# Patient Record
Sex: Female | Born: 1935 | Race: White | Hispanic: No | Smoking: Former smoker
Health system: Southern US, Community
[De-identification: ages and names within clinical notes are randomized; demographics above are authoritative.]

## PROBLEM LIST (undated history)

## (undated) DIAGNOSIS — M1990 Unspecified osteoarthritis, unspecified site: Secondary | ICD-10-CM

## (undated) DIAGNOSIS — G4730 Sleep apnea, unspecified: Secondary | ICD-10-CM

## (undated) DIAGNOSIS — I10 Essential (primary) hypertension: Secondary | ICD-10-CM

## (undated) DIAGNOSIS — R0602 Shortness of breath: Secondary | ICD-10-CM

## (undated) DIAGNOSIS — I639 Cerebral infarction, unspecified: Secondary | ICD-10-CM

## (undated) HISTORY — PX: EYE SURGERY: SHX253

## (undated) HISTORY — PX: OTHER SURGICAL HISTORY: SHX169

---

## 1999-10-11 ENCOUNTER — Encounter: Payer: Self-pay | Admitting: Family Medicine

## 1999-10-11 ENCOUNTER — Ambulatory Visit (HOSPITAL_COMMUNITY): Admission: RE | Admit: 1999-10-11 | Discharge: 1999-10-11 | Payer: Self-pay | Admitting: *Deleted

## 2001-07-15 ENCOUNTER — Ambulatory Visit (HOSPITAL_COMMUNITY): Admission: RE | Admit: 2001-07-15 | Discharge: 2001-07-15 | Payer: Self-pay | Admitting: Family Medicine

## 2002-03-04 ENCOUNTER — Encounter: Payer: Self-pay | Admitting: Family Medicine

## 2002-03-04 ENCOUNTER — Ambulatory Visit (HOSPITAL_COMMUNITY): Admission: RE | Admit: 2002-03-04 | Discharge: 2002-03-04 | Payer: Self-pay | Admitting: Family Medicine

## 2002-12-24 ENCOUNTER — Ambulatory Visit (HOSPITAL_COMMUNITY): Admission: RE | Admit: 2002-12-24 | Discharge: 2002-12-24 | Payer: Self-pay | Admitting: Family Medicine

## 2003-09-21 ENCOUNTER — Ambulatory Visit: Payer: Self-pay | Admitting: Family Medicine

## 2003-09-22 ENCOUNTER — Ambulatory Visit: Payer: Self-pay | Admitting: *Deleted

## 2003-09-24 ENCOUNTER — Ambulatory Visit (HOSPITAL_COMMUNITY): Admission: RE | Admit: 2003-09-24 | Discharge: 2003-09-24 | Payer: Self-pay | Admitting: Family Medicine

## 2003-10-02 ENCOUNTER — Ambulatory Visit: Payer: Self-pay | Admitting: Family Medicine

## 2003-12-08 DIAGNOSIS — K5730 Diverticulosis of large intestine without perforation or abscess without bleeding: Secondary | ICD-10-CM | POA: Insufficient documentation

## 2004-09-13 ENCOUNTER — Ambulatory Visit (HOSPITAL_COMMUNITY): Admission: RE | Admit: 2004-09-13 | Discharge: 2004-09-13 | Payer: Self-pay | Admitting: Family Medicine

## 2004-09-19 ENCOUNTER — Other Ambulatory Visit: Admission: RE | Admit: 2004-09-19 | Discharge: 2004-09-19 | Payer: Self-pay | Admitting: Family Medicine

## 2004-09-19 ENCOUNTER — Ambulatory Visit: Payer: Self-pay | Admitting: Family Medicine

## 2004-09-20 ENCOUNTER — Ambulatory Visit: Payer: Self-pay | Admitting: Family Medicine

## 2005-09-15 ENCOUNTER — Ambulatory Visit (HOSPITAL_COMMUNITY): Admission: RE | Admit: 2005-09-15 | Discharge: 2005-09-15 | Payer: Self-pay | Admitting: Family Medicine

## 2005-09-28 ENCOUNTER — Ambulatory Visit: Payer: Self-pay | Admitting: Internal Medicine

## 2005-10-03 ENCOUNTER — Ambulatory Visit: Payer: Self-pay | Admitting: Family Medicine

## 2005-10-05 ENCOUNTER — Ambulatory Visit: Payer: Self-pay | Admitting: Family Medicine

## 2006-05-05 ENCOUNTER — Encounter (INDEPENDENT_AMBULATORY_CARE_PROVIDER_SITE_OTHER): Payer: Self-pay | Admitting: Family Medicine

## 2006-05-05 DIAGNOSIS — I872 Venous insufficiency (chronic) (peripheral): Secondary | ICD-10-CM | POA: Insufficient documentation

## 2006-05-05 DIAGNOSIS — M1990 Unspecified osteoarthritis, unspecified site: Secondary | ICD-10-CM | POA: Insufficient documentation

## 2006-07-26 ENCOUNTER — Ambulatory Visit: Payer: Self-pay | Admitting: Internal Medicine

## 2006-11-30 ENCOUNTER — Emergency Department (HOSPITAL_COMMUNITY): Admission: EM | Admit: 2006-11-30 | Discharge: 2006-11-30 | Payer: Self-pay | Admitting: Family Medicine

## 2007-01-18 ENCOUNTER — Telehealth (INDEPENDENT_AMBULATORY_CARE_PROVIDER_SITE_OTHER): Payer: Self-pay | Admitting: Family Medicine

## 2007-01-21 ENCOUNTER — Encounter (INDEPENDENT_AMBULATORY_CARE_PROVIDER_SITE_OTHER): Payer: Self-pay | Admitting: Family Medicine

## 2007-02-21 ENCOUNTER — Ambulatory Visit (HOSPITAL_COMMUNITY): Admission: RE | Admit: 2007-02-21 | Discharge: 2007-02-21 | Payer: Self-pay | Admitting: Family Medicine

## 2007-02-26 ENCOUNTER — Encounter (INDEPENDENT_AMBULATORY_CARE_PROVIDER_SITE_OTHER): Payer: Self-pay | Admitting: Family Medicine

## 2007-02-26 ENCOUNTER — Ambulatory Visit: Payer: Self-pay | Admitting: Family Medicine

## 2007-02-26 DIAGNOSIS — M25519 Pain in unspecified shoulder: Secondary | ICD-10-CM

## 2007-02-26 DIAGNOSIS — I10 Essential (primary) hypertension: Secondary | ICD-10-CM | POA: Insufficient documentation

## 2007-02-26 LAB — CONVERTED CEMR LAB
Nitrite: NEGATIVE
Pap Smear: NORMAL
Specific Gravity, Urine: 1.025
WBC Urine, dipstick: NEGATIVE

## 2007-03-01 ENCOUNTER — Ambulatory Visit: Payer: Self-pay | Admitting: Family Medicine

## 2007-03-01 LAB — CONVERTED CEMR LAB
ALT: 13 units/L (ref 0–35)
AST: 14 units/L (ref 0–37)
Albumin: 3.7 g/dL (ref 3.5–5.2)
Alkaline Phosphatase: 71 units/L (ref 39–117)
BUN: 13 mg/dL (ref 6–23)
Basophils Absolute: 0 10*3/uL (ref 0.0–0.1)
Basophils Relative: 1 % (ref 0–1)
Creatinine, Ser: 0.73 mg/dL (ref 0.40–1.20)
Eosinophils Relative: 5 % (ref 0–5)
Glucose, Bld: 98 mg/dL (ref 70–99)
HCT: 43.8 % (ref 36.0–46.0)
HDL: 44 mg/dL (ref >39)
Neutrophils Relative %: 44 % (ref 43–77)
Potassium: 3.9 meq/L (ref 3.5–5.3)
RBC: 4.54 M/uL (ref 3.87–5.11)
RDW: 12.7 % (ref 11.5–15.5)
Sodium: 141 meq/L (ref 135–145)
TSH: 2.061 microintl units/mL (ref 0.350–5.50)
Total Bilirubin: 0.5 mg/dL (ref 0.3–1.2)
Total Protein: 6.5 g/dL (ref 6.0–8.3)
WBC: 6.2 10*3/uL (ref 4.0–10.5)

## 2008-04-15 ENCOUNTER — Ambulatory Visit (HOSPITAL_COMMUNITY): Admission: RE | Admit: 2008-04-15 | Discharge: 2008-04-15 | Payer: Self-pay | Admitting: Internal Medicine

## 2008-04-21 ENCOUNTER — Encounter (INDEPENDENT_AMBULATORY_CARE_PROVIDER_SITE_OTHER): Payer: Self-pay | Admitting: Family Medicine

## 2008-06-25 ENCOUNTER — Ambulatory Visit: Payer: Self-pay | Admitting: Internal Medicine

## 2008-06-25 DIAGNOSIS — K648 Other hemorrhoids: Secondary | ICD-10-CM | POA: Insufficient documentation

## 2008-06-25 DIAGNOSIS — Q828 Other specified congenital malformations of skin: Secondary | ICD-10-CM | POA: Insufficient documentation

## 2008-06-25 DIAGNOSIS — K644 Residual hemorrhoidal skin tags: Secondary | ICD-10-CM | POA: Insufficient documentation

## 2008-06-25 LAB — CONVERTED CEMR LAB
ALT: 13 units/L (ref 0–35)
AST: 16 units/L (ref 0–37)
BUN: 13 mg/dL (ref 6–23)
Basophils Absolute: 0.1 10*3/uL (ref 0.0–0.1)
Basophils Relative: 1 % (ref 0–1)
Bilirubin Urine: NEGATIVE
CO2: 30 meq/L (ref 19–32)
Chloride: 108 meq/L (ref 96–112)
Glucose, Bld: 104 mg/dL — ABNORMAL HIGH (ref 70–99)
HDL: 45 mg/dL (ref >39)
Hemoglobin: 14.3 g/dL (ref 12.0–15.0)
Lymphs Abs: 2.4 10*3/uL (ref 0.7–4.0)
MCHC: 33.4 g/dL (ref 30.0–36.0)
Monocytes Relative: 9 % (ref 3–12)
Neutro Abs: 3 10*3/uL (ref 1.7–7.7)
Platelets: 192 10*3/uL (ref 150–400)
Potassium: 4.3 meq/L (ref 3.5–5.3)
Specific Gravity, Urine: 1.025
Total Bilirubin: 0.4 mg/dL (ref 0.3–1.2)
Total CHOL/HDL Ratio: 4.2
Triglycerides: 99 mg/dL (ref <150)
Urobilinogen, UA: 0.2
WBC: 6.3 10*3/uL (ref 4.0–10.5)
pH: 5

## 2008-07-02 ENCOUNTER — Encounter (INDEPENDENT_AMBULATORY_CARE_PROVIDER_SITE_OTHER): Payer: Self-pay | Admitting: Internal Medicine

## 2008-10-26 ENCOUNTER — Ambulatory Visit: Payer: Self-pay | Admitting: Internal Medicine

## 2008-10-26 LAB — CONVERTED CEMR LAB
Blood Glucose, AC Bkfst: 137 mg/dL
Hgb A1c MFr Bld: 5.6 %

## 2008-10-31 ENCOUNTER — Encounter (INDEPENDENT_AMBULATORY_CARE_PROVIDER_SITE_OTHER): Payer: Self-pay | Admitting: Internal Medicine

## 2008-10-31 ENCOUNTER — Encounter (INDEPENDENT_AMBULATORY_CARE_PROVIDER_SITE_OTHER): Payer: Self-pay | Admitting: Psychiatry

## 2008-10-31 ENCOUNTER — Inpatient Hospital Stay (HOSPITAL_COMMUNITY): Admission: EM | Admit: 2008-10-31 | Discharge: 2008-10-31 | Payer: Self-pay | Admitting: Emergency Medicine

## 2008-11-10 ENCOUNTER — Ambulatory Visit: Payer: Self-pay | Admitting: Internal Medicine

## 2008-11-20 ENCOUNTER — Ambulatory Visit: Payer: Self-pay | Admitting: Internal Medicine

## 2008-11-20 DIAGNOSIS — H43819 Vitreous degeneration, unspecified eye: Secondary | ICD-10-CM

## 2008-11-20 DIAGNOSIS — B079 Viral wart, unspecified: Secondary | ICD-10-CM | POA: Insufficient documentation

## 2009-02-09 ENCOUNTER — Encounter: Admission: RE | Admit: 2009-02-09 | Discharge: 2009-02-09 | Payer: Self-pay | Admitting: Internal Medicine

## 2009-06-16 ENCOUNTER — Ambulatory Visit (HOSPITAL_COMMUNITY): Admission: RE | Admit: 2009-06-16 | Discharge: 2009-06-16 | Payer: Self-pay | Admitting: Internal Medicine

## 2009-07-02 ENCOUNTER — Ambulatory Visit: Payer: Self-pay | Admitting: Internal Medicine

## 2009-07-02 DIAGNOSIS — R5383 Other fatigue: Secondary | ICD-10-CM | POA: Insufficient documentation

## 2009-07-02 DIAGNOSIS — H43399 Other vitreous opacities, unspecified eye: Secondary | ICD-10-CM | POA: Insufficient documentation

## 2009-07-02 DIAGNOSIS — R5381 Other malaise: Secondary | ICD-10-CM

## 2009-07-08 ENCOUNTER — Encounter (INDEPENDENT_AMBULATORY_CARE_PROVIDER_SITE_OTHER): Payer: Self-pay | Admitting: Internal Medicine

## 2009-07-08 ENCOUNTER — Ambulatory Visit (HOSPITAL_COMMUNITY): Admission: RE | Admit: 2009-07-08 | Discharge: 2009-07-08 | Payer: Self-pay | Admitting: Internal Medicine

## 2009-07-09 ENCOUNTER — Encounter (INDEPENDENT_AMBULATORY_CARE_PROVIDER_SITE_OTHER): Payer: Self-pay | Admitting: Internal Medicine

## 2009-07-12 ENCOUNTER — Ambulatory Visit: Payer: Self-pay | Admitting: Internal Medicine

## 2009-07-12 LAB — CONVERTED CEMR LAB
AST: 18 units/L (ref 0–37)
Albumin: 3.9 g/dL (ref 3.5–5.2)
BUN: 17 mg/dL (ref 6–23)
Basophils Relative: 1 % (ref 0–1)
CO2: 25 meq/L (ref 19–32)
Calcium: 8.7 mg/dL (ref 8.4–10.5)
Chloride: 105 meq/L (ref 96–112)
Creatinine, Ser: 1.02 mg/dL (ref 0.40–1.20)
Eosinophils Absolute: 0.2 10*3/uL (ref 0.0–0.7)
Glucose, Bld: 104 mg/dL — ABNORMAL HIGH (ref 70–99)
Glucose, Urine, Semiquant: NEGATIVE
HCT: 43 % (ref 36.0–46.0)
HDL: 48 mg/dL (ref >39)
Hemoglobin: 14.2 g/dL (ref 12.0–15.0)
Lymphs Abs: 2.1 10*3/uL (ref 0.7–4.0)
Neutro Abs: 4.4 10*3/uL (ref 1.7–7.7)
Neutrophils Relative %: 60 % (ref 43–77)
Nitrite: NEGATIVE
Platelets: 214 10*3/uL (ref 150–400)
Protein, U semiquant: NEGATIVE
RBC: 4.39 M/uL (ref 3.87–5.11)
RDW: 13 % (ref 11.5–15.5)
Total Bilirubin: 0.6 mg/dL (ref 0.3–1.2)
Total CHOL/HDL Ratio: 3.5
Total Protein: 6.9 g/dL (ref 6.0–8.3)
VLDL: 15 mg/dL (ref 0–40)

## 2009-07-20 ENCOUNTER — Ambulatory Visit: Payer: Self-pay | Admitting: Internal Medicine

## 2009-07-20 DIAGNOSIS — R7309 Other abnormal glucose: Secondary | ICD-10-CM | POA: Insufficient documentation

## 2009-09-21 ENCOUNTER — Telehealth (INDEPENDENT_AMBULATORY_CARE_PROVIDER_SITE_OTHER): Payer: Self-pay | Admitting: Internal Medicine

## 2009-09-24 ENCOUNTER — Observation Stay (HOSPITAL_COMMUNITY)
Admission: EM | Admit: 2009-09-24 | Discharge: 2009-09-25 | Payer: Self-pay | Source: Home / Self Care | Admitting: Emergency Medicine

## 2009-09-25 ENCOUNTER — Ambulatory Visit: Payer: Self-pay | Admitting: Cardiology

## 2009-09-25 ENCOUNTER — Encounter (INDEPENDENT_AMBULATORY_CARE_PROVIDER_SITE_OTHER): Payer: Self-pay | Admitting: Emergency Medicine

## 2009-09-25 ENCOUNTER — Ambulatory Visit: Payer: Self-pay | Admitting: Vascular Surgery

## 2009-09-30 ENCOUNTER — Telehealth (INDEPENDENT_AMBULATORY_CARE_PROVIDER_SITE_OTHER): Payer: Self-pay | Admitting: Internal Medicine

## 2009-11-02 ENCOUNTER — Ambulatory Visit: Payer: Self-pay | Admitting: Internal Medicine

## 2009-11-02 DIAGNOSIS — G459 Transient cerebral ischemic attack, unspecified: Secondary | ICD-10-CM | POA: Insufficient documentation

## 2009-11-05 ENCOUNTER — Telehealth (INDEPENDENT_AMBULATORY_CARE_PROVIDER_SITE_OTHER): Payer: Self-pay | Admitting: Internal Medicine

## 2010-02-01 NOTE — Progress Notes (Signed)
Summary: Office Visit//DEPRESSION SCREENING  Office Visit//DEPRESSION SCREENING   Imported By: Arta Bruce 07/08/2009 14:48:06  _____________________________________________________________________  External Attachment:    Type:   Image     Comment:   External Document

## 2010-02-01 NOTE — Assessment & Plan Note (Signed)
Summary: cpp/////kt   Vital Signs:  Patient profile:   75 year old female Menstrual status:  postmenopausal Height:      64.25 inches Weight:      292 pounds BMI:     49.91 Temp:     97.9 degrees F oral Pulse rate:   98 / minute Pulse rhythm:   regular Resp:     24 per minute BP sitting:   47 / 74  (left arm) Cuff size:   large  Vitals Entered By: Armenia Shannon (July 02, 2009 2:34 PM) CC: Shelby Wiley wants to know if she can have meds for a long period of time so she wont have to keep going to pharmacy every 30 days... Is Patient Diabetic? No Pain Assessment Patient in pain? no       Does patient need assistance? Functional Status Self care Ambulation Normal   CC:  Shelby Wiley wants to know if she can have meds for a long period of time so she wont have to keep going to pharmacy every 30 days....  History of Present Illness: 75 yo female here for CPP.  Concerns:  1.  Knees are very painful:  using a cane at home.  Does not go out secondary to the pain.  Feels like she needs to have something done so she can enjoy life more.  Has had arthroscopic surgery for her left knee with arthritis in the past.  Last Xrays done in early to mid 90s.    2.  Fatigue:  not sure if secondary to inability to be active with knees--feels this has been for the past 2 months.  Has really gone hand in hand with mobility.  NO melena or hematochezia.  No abdominal pain.    3.  Mom had history of afib and secondary stroke.  Wiley. not aware of anything predisposing her mother to that.  Wiley. wants to know if she is at risk.  Has been picking up irregular heartbeats on heart monitor.  4.  Phlegm:  occasional yellow mucous production--generally after have lay down and sat back home.   Allergies: 1)  ! Codeine Sulfate  Past History:  Past Medical History: Reviewed history from 06/25/2008 and no changes required. HEMORRHOIDS, INTERNAL (ICD-455.0) EXTERNAL HEMORRHOIDS (ICD-455.3) ROUTINE GYNECOLOGICAL  EXAMINATION (ICD-V72.31) SHOULDER PAIN, RIGHT (ICD-719.41) HYPERTENSION, BORDERLINE (ICD-401.9) EXAMINATION, ROUTINE MEDICAL (ICD-V70.0) DIVERTICULOSIS, COLON (ICD-562.10) OSTEOARTHRITIS (ICD-715.90) INSUFFICIENCY, VENOUS NOS (ICD-459.81) OBESITY, MORBID (ICD-278.01)  Past Surgical History: 1.  Bilateral Cataract Extractions 2.  Left Knee Arthroscopic Surgery--before 1995  Family History: Reviewed history from 06/25/2008 and no changes required. Mother, died 65:  Stroke.  Not clear what she died from--Wiley. cannot say why unclear. Father, died 10s:  Alzheimer's dementia,  Sister, died 57:  Breast cancer--diagnosed age 90 Brother, died 34:  MI Has 3 sisters living--6 other sisters have died at elderly ages, Wiley. not sure from what.  Ones living are relatively healthy Son, 53: DM, overweight  Daughter, 71:  healthy.  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  1 1/2 ppd.  Quit at 75 yo Alcohol:  rare--none now. Drug use:  No  Review of Systems Psych:  PHQ 9 scored 5--mainly secondary to fatigue.  Physical Exam  General:  morbidly obese Head:  Normocephalic and atraumatic without obvious abnormalities. No apparent alopecia or balding. Eyes:  No corneal or conjunctival inflammation noted. EOMI. Perrla. Funduscopic exam benign, without hemorrhages, exudates or  papilledema. Vision grossly normal. Ears:  External ear exam shows no significant lesions or deformities.  Otoscopic examination reveals clear canals, tympanic membranes are intact bilaterally without bulging, retraction, inflammation or discharge. Hearing is grossly normal bilaterally. Nose:  External nasal examination shows no deformity or inflammation. Nasal mucosa are pink and moist without lesions or exudates. Mouth:  Oral mucosa and oropharynx without lesions or exudates.  Teeth in good repair. Neck:  No deformities, masses, or tenderness  noted. Breasts:  No mass, nodules, thickening, tenderness, bulging, retraction, inflamation, nipple discharge or skin changes noted.   Lungs:  Normal respiratory effort, chest expands symmetrically. Lungs are clear to auscultation, no crackles or wheezes. Heart:  Normal rate and regular rhythm. S1 and S2 normal without gallop, murmur, click, rub or other extra sounds. Abdomen:  Bowel sounds positive,abdomen soft and non-tender without masses, organomegaly or hernias noted. Rectal:  No external abnormalities noted. Normal sphincter tone. No rectal masses or tenderness.  Heme negative light brown stool Genitalia:  Pelvic Exam:        External: normal female genitalia without lesions or masses                     Adnexa: normal bimanual exam without masses or fullness        Uterus: normal by palpation        Pap smear: not performed Exam limited by Wiley's size Msk:  No deformity or scoliosis noted of thoracic or lumbar spine.   Pulses:  R and L carotid,radial,femoral,dorsalis pedis and posterior tibial pulses are full and equal bilaterally Extremities:  hypertrophic changes to bilateral knees Neurologic:  No cranial nerve deficits noted. Station and gait are normal. Plantar reflexes are down-going bilaterally. DTRs are symmetrical throughout. Sensory, motor and coordinative functions appear intact. Skin:  Multiple pink flaky lesions scattered mainly on arms--Wiley. still not using sunscreen Cervical Nodes:  No lymphadenopathy noted Axillary Nodes:  No palpable lymphadenopathy Inguinal Nodes:  No significant adenopathy Psych:  Cognition and judgment appear intact. Alert and cooperative with normal attention span and concentration. No apparent delusions, illusions, hallucinations   Impression & Recommendations:  Problem # 1:  ROUTINE GYNECOLOGICAL EXAMINATION (ICD-V72.31) Mammogram done  Problem # 2:  HEALTH MAINTENANCE EXAM (ICD-V70.0) Guaiac cards x3 to return in 2 weeks. Immunizations up to  date--Wiley. refuses yearly flu vaccine  Problem # 3:  OSTEOARTHRITIS (ICD-715.90) Particularly of knees Water exercise Schedule for injection starting with right knee. Discussed complications/benefits Orders: Diagnostic X-Ray/Fluoroscopy (Diagnostic X-Ray/Flu)  Problem # 4:  ACTINIC KERATOSIS,ARMS AND LEGS (ICD-702.0) Wiley. to follow up in November to get started on 5 FU cream  Problem # 5:  Family Hx of FIBRILLATION, ATRIAL (ICD-427.31) Discussed with Wiley. that she has normal rhythm and what to look for with afib and pulse  Complete Medication List: 1)  Calcium-vitamin D 600-200 Mg-unit Tabs (Calcium-vitamin d) .Marland Kitchen.. 1 tab by mouth two times a day 2)  [facility] Use  .... Medically clear to use facilities.  would like her to use pool on daily basis if possible 3)  Hydrochlorothiazide 12.5 Mg Caps (Hydrochlorothiazide) .Marland Kitchen.. 1 tab by mouth daily  Patient Instructions: 1)  Schedule appt. with Dr. Delrae Alfred for knee injection--right knee first. 2)  OV to start 5 FU in November with Dr. Charmayne Sheer keratoses 3)  Fasting lab visit for FLP, UA--also TSH, CBC, CMET for dx of fatigue  Preventive Care Screening  Prior Values:    Pap Smear:  normal (02/26/2007)  Mammogram:  ASSESSMENT: Negative - BI-RADS 1^MM DIGITAL SCREENING (06/16/2009)    Colonoscopy:  Done (12/03/2003)    Bone Density:  normal (09/24/2003)    Last Tetanus Booster:  Historical (08/20/2002)    Last Flu Shot:  Historical (10/03/2005)    Last Pneumovax:  Historical (08/20/2002)    Dexa Interp:  normal (09/24/2003)     SBE:  Does not perform Guaiac Cards:  Has missed in recent years.  Prescriptions: [facility] USE Medically clear to use facilities.  Would like her to use pool on daily basis if possible  #1 x 0   Entered and Authorized by:   Julieanne Manson MD   Signed by:   Julieanne Manson MD on 07/02/2009   Method used:   Print then Give to Patient   RxID:   [id number] HYDROCHLOROTHIAZIDE 12.5  MG CAPS (HYDROCHLOROTHIAZIDE) 1 tab by mouth daily  #90 x 3   Entered and Authorized by:   Julieanne Manson MD   Signed by:   Julieanne Manson MD on 07/02/2009   Method used:   Print then Give to Patient   RxID:   [id number]

## 2010-02-01 NOTE — Assessment & Plan Note (Signed)
Summary: SCHED APPT WITH DR Neima Lacross FOR KNEE INJECTION/ RIGHT KNEE FI...   Vital Signs:  Patient profile:   75 year old female Menstrual status:  postmenopausal Weight:      286 pounds Temp:     97.6 degrees F Pulse rate:   92 / minute Pulse rhythm:   regular Resp:     20 per minute BP sitting:   132 / 80  (left arm) Cuff size:   large  Vitals Entered By: Vesta Mixer CMA (July 20, 2009 3:42 PM) CC: rt knee injection and test results Is Patient Diabetic? No Pain Assessment Patient in pain? yes       Does patient need assistance? Ambulation Impaired:Risk for fall   CC:  rt knee injection and test results.  History of Present Illness: 1.  Here for knee injection.  Asking many questions--seems concerned with injection again today.  Discussed risk of infection again and pt. seems quite anxious regarding this.  Decided to table injection for now and to just work on weight loss.  Discussed that she needs to feel comfortable with having the injection done and working on weight loss is absolutely okay.   2.  labs:  discussed CBC, CMET and cholesterol panel all okay except for slightly elevated sugar--discussed improving diet and exercise.  Allergies (verified): 1)  ! Codeine Sulfate   Impression & Recommendations:  Problem # 1:  OSTEOARTHRITIS (ICD-715.90) of knees--pt. to work on exercises and weight loss instead of injection today. Copies of knee films given (reports)  Problem # 2:  HYPERGLYCEMIA, BORDERLINE (ICD-790.29) As above--weight loss, lifestyle changes  Complete Medication List: 1)  Calcium-vitamin D 600-200 Mg-unit Tabs (Calcium-vitamin d) .Marland Kitchen.. 1 tab by mouth two times a day 2)  [facility] Use  .... Medically clear to use facilities.  would like her to use pool on daily basis if possible 3)  Hydrochlorothiazide 12.5 Mg Caps (Hydrochlorothiazide) .Marland Kitchen.. 1 tab by mouth daily  Patient Instructions: 1)  Acetaminophen 1000 mg two times a day for knee pain ( I  would take every day, twice daily for knee pain.)

## 2010-02-01 NOTE — Progress Notes (Signed)
Summary: Wants ABI test  Phone Note Call from Patient   Summary of Call: Concerned that she might have PAD, since her mother had a stroke and is wondering if we have the ability to do the Ankle-Brachial Index (ABI) test here.  She would like to have it done "since it's a simple test" and would come in sooner if she can get it done here.  Only wants a return call if we can administer the test to her. Initial call taken by: Dutch Quint RN,  September 30, 2009 12:51 PM  Follow-up for Phone Call        We cannot do the test in our office (please feel free to share that knowledge in future).  We can, however send her to [facility] for that. I would need to see her first to see if she had any concerning history for PAD or if any physical findings to support  Follow-up by: Julieanne Manson MD,  October 01, 2009 12:20 AM  Additional Follow-up for Phone Call Additional follow up Details #1::        Advised of provider's response -- states that she doesn't have a history or significant symptoms. She will call back if she decides that she wants her appt. moved up.   Additional Follow-up by: Dutch Quint RN,  October 01, 2009 5:50 PM

## 2010-02-01 NOTE — Progress Notes (Signed)
Summary: NUMB TINGLY FEELIN ON LEFT SIDE  Phone Note Call from Patient Call back at [facility] Phone [phone]   Summary of Call: MULBERRY PT. [name] SAYS THAT FOR THE LAST 2 DAYS HER LEFT SIDE FEELS NUMB AND TINGLY AND PINS STICKING HER,THE FEELING  GOES DOWN HER ARM AND TO HER LEFT FOOT. IT DOESN'T LAST LONG,  BUT LONG ENOUGH TO BECONCERNED. IT HAPPENS ABOUT 2-3 X'S A DAY, ITS A FEELING LIKE WHEN YOU HAND OR FOOT GOES TO SLEEP.. SHE WOULD LIKE TO SPEAK TO A NURSE. [name] SAYS THAT IF ITS A CIRCULATION PROBLEM, SHE'LL TRY TO MOVE AROUND MORE. Initial call taken by: Leodis Rains,  September 21, 2009 4:48 PM  Follow-up for Phone Call        Left message on answering machine for pt. to return call.  Dutch Quint RN  September 22, 2009 10:18 AM   Additional Follow-up for Phone Call Additional follow up Details #1::        Has not happened today.  Just "pins and needles" feeling to left arm and leg.  Starts in arm or leg, but goes all the way to the foot.  Has had a dull headache, not sure if it's her eyes or not; hasn't had headache today.  Doesn't know what this is, yesterday it happened twice.  Denies any other symptoms.  Has a blood pressure monitor, but it's not as accurate as it used to be. Has been less active lately.  She's not sure if she needs an exam or not.  Wants to see what happens today and call back tomorrow morning. Additional Follow-up by: Dutch Quint RN,  September 22, 2009 12:30 PM    Additional Follow-up for Phone Call Additional follow up Details #2::    Left message on answering machine for pt. to return call.  Dutch Quint RN  September 27, 2009 10:16 AM  Shelby Wiley to ER Friday night and was admitted with dx of TIA.   F/U with neurologist in two weeks.  Confirmed appt. with Dr. Delrae Alfred on 11/02/09 -- discharge instructions are for f/u in one month.  States she had gotten only one episode so she didn't call back.  Symptoms became severe and she called EMS.  Was put on  full aspirin daily, rather than low-dose.  Wanted provider to know. Follow-up by: Dutch Quint RN,  September 27, 2009 12:05 PM  Additional Follow-up for Phone Call Additional follow up Details #3:: Details for Additional Follow-up Action Taken: Please let her know if she has any more symptoms before I see her, to go to ED immediately.  Julieanne Manson MD  September 29, 2009 7:54 AM   Left message on answering machine for pt. to return call.  Dutch Quint RN  September 29, 2009 11:22 AM  Advised pt. of provider's instructions; reminded not to wait if symptoms reoccur.  Discussed symptoms of CVA.  Dutch Quint RN  September 29, 2009 2:18 PM

## 2010-02-01 NOTE — Letter (Signed)
Summary: *HSN Results Follow up  [facility]  [address] [place], [place] [address]   Phone: [phone]  Fax: [phone]      07/09/2009   Shelby Wiley [address] [place], [place]  [address]   Dear  Ms. Shelby Wiley,                            ____S.Drinkard,FNP   ____D. Gore,FNP       ____B. McPherson,MD   ____V. Rankins,MD    _X___E. Perle Brickhouse,MD    ____N. Daphine Deutscher, FNP  ____D. Reche Dixon, MD    ____K. Philipp Deputy, MD    ____Other     This letter is to inform you that your recent test(s):  _______Pap Smear    _______Lab Test     ___X____X-ray    _______ is within acceptable limits  _______ requires a medication change  _______ requires a follow-up lab visit  _______ requires a follow-up visit with your provider   Comments:  Both knees with significant arthritis.  We'll see how you do after the injection.       _________________________________________________________ If you have any questions, please contact our office                     Sincerely,  Julieanne Manson MD [facility]

## 2010-02-01 NOTE — Progress Notes (Signed)
Summary: SKIN MED IS EXPENSIVE - Needs referral for dermatology clinic  Phone Note Call from Patient Call back at Home Phone [phone]   Summary of Call: Shelby Wiley PT . MS Shelby Wiley CALLED TO SAY SHE TOOK THE RX TO [facility] FOR HER SKIN AND ITS GONNA COST $370 AND SHE CANT AFFORD IT. SO SHE IS GOING TO TRY TO JUST USE COSMETIC XFOLIATING MOISTURIZERS. Initial call taken by: Shelby Wiley,  November 05, 2009 1:01 PM  Follow-up for Phone Call        Spoke with pt. -- she happened to pick up list of $4.00 medication and wants to know if Fluocinonide 0.05% cream 15 gm or 60 gm could be used as an alternative.  Advised that it's used for a variety of skin conditions, but not necessarily indicated for hers.  Is looking for a lesser expensive alternative.  Follow-up by: Shelby Quint RN,  November 05, 2009 3:09 PM  Additional Follow-up for Phone Call Additional follow up Details #1::        They are not similar creams.   I can send her to derm clinic to see if they will look at her skin and determine if she perhaps can be treated for individual lesions with liquid nitrogen. Please let her know--if she agrees, please forward to Arna Medici. Additional Follow-up by: Shelby Manson MD,  November 10, 2009 11:58 AM    Additional Follow-up for Phone Call Additional follow up Details #2::    Pt. is interested in pursuing tx with dermatology clinic.  Forwarded to [facility].  Shelby Quint RN  November 10, 2009 5:09 PM  I MADE HER AN APPT TO GO TO [facility] 12-09-09 @ 9:50 AM TO SEE DR [facility] PT AWARE OF HER APPT .Marland KitchenCheryll Dessert  November 11, 2009 10:56 AM

## 2010-02-01 NOTE — Letter (Signed)
Summary: SUSIE'S SUMMARY  SUSIE'S SUMMARY   Imported By: Arta Bruce 01/13/2009 15:41:59  _____________________________________________________________________  External Attachment:    Type:   Image     Comment:   External Document

## 2010-02-01 NOTE — Medication Information (Signed)
Summary: SCRIPT WRITTEN BY DR Barbaraann Barthel TO BE PICKED UP BY PATIENT  SCRIPT WRITTEN BY DR Barbaraann Barthel TO BE PICKED UP BY PATIENT   Imported By: Arta Bruce 01/23/2007 16:53:21  _____________________________________________________________________  External Attachment:    Type:   Image     Comment:   External Document

## 2010-02-01 NOTE — Letter (Signed)
Summary: DR.STEVEN BERNSTORF,OD,PA  DR.STEVEN BERNSTORF,OD,PA   Imported By: Arta Bruce 07/12/2009 08:59:21  _____________________________________________________________________  External Attachment:    Type:   Image     Comment:   External Document

## 2010-02-01 NOTE — Miscellaneous (Signed)
  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 Jettie Booze.

## 2010-02-01 NOTE — Assessment & Plan Note (Signed)
Summary: OV TO START 5 FU IN NOVEMBER WITH DR MULBERRY ACTINIC KERATOS...   Vital Signs:  Patient profile:   75 year old female Menstrual status:  postmenopausal Height:      64.25 inches Weight:      283 pounds BMI:     48.37 O2 Sat:      93 % on Room air Temp:     97.3 degrees F oral Pulse rate:   86 / minute Pulse rhythm:   regular Resp:     20 per minute BP sitting:   130 / 74  (left arm) Cuff size:   large  Vitals Entered By: Armenia Shannon (November 02, 2009 2:03 PM)  O2 Flow:  Room air CC: f/u... Is Patient Diabetic? No Pain Assessment Patient in pain? no       Does patient need assistance? Functional Status Self care Ambulation Normal   CC:  f/u....  History of Present Illness: 1.  09/25/09:  developed numbness of left arm, then leg, then left face.  Was seen in the ED and underwent MRI/MRA, vascular studies of carotid arteries, echocardiogram, all of which were relatively unremarkable--small vessel disease of brain really only finding.  Is now taking 325 mg aspirin daily.  Misses some days.  Has had one short lived episode the following day.  LDL is 107.  HDL 39.  Pt. is working on exercise and feels she is motivated.  She feels she was very stressed at the time of this episode and that is now improved.  She has not had any further episodes since her stress level dropped.  Pt. cancelled her neuology appt. She feels it would be a waste of everyone's time to go.     2.  Painful bump on left wrist this morning--ventral aspect.    3.  Actinic keratosis:  Pt. here today to get started with 5 FU.  Unfortunately, she does not have Medicare coverage.  Current Medications (verified): 1)  Calcium-Vitamin D 600-200 Mg-Unit Tabs (Calcium-Vitamin D) .Marland Kitchen.. 1 Tab By Mouth Two Times A Day 2)  [facility] Use .... Medically Clear To Use Facilities.  Would Like Her To Use Pool On Daily Basis If Possible 3)  Hydrochlorothiazide 12.5 Mg Caps (Hydrochlorothiazide) .Marland Kitchen.. 1 Tab By Mouth  Daily  Allergies (verified): 1)  ! Codeine Sulfate  Physical Exam  Neurologic:  No cranial nerve deficits noted. Station and gait are normal. Plantar reflexes are down-going bilaterally. DTRs are symmetrical throughout. Sensory, motor and coordinative functions appear intact. Skin:  Very dry.   Areas of pink flaking on forearms and right frontal hairline.   Impression & Recommendations:  Problem # 1:  TIA (ICD-435.9) Complete work up Encouraged pt. to take ASA regularly To call if recurrent symptoms Needs to continue to work on diet and exercise to improve risk factors Recheck cholesterol at next follow up  Problem # 2:  ACTINIC KERATOSIS,ARMS AND LEGS (ICD-702.0) 5FU cream  Complete Medication List: 1)  Calcium-vitamin D 600-200 Mg-unit Tabs (Calcium-vitamin d) .Marland Kitchen.. 1 tab by mouth two times a day 2)  [facility] Use  .... Medically clear to use facilities.  would like her to use pool on daily basis if possible 3)  Hydrochlorothiazide 12.5 Mg Caps (Hydrochlorothiazide) .Marland Kitchen.. 1 tab by mouth daily 4)  Fluorouracil 5 % Crea (Fluorouracil) .... Apply 4 times daily to arms, legs and spot treat face for 14 days  Patient Instructions: 1)  Follow up with Dr. Delrae Alfred in 3 months --  actinic keratosis 2)  Keep out of sun--wear coverage over affected skin. 3)  Use 30 spf sunscreen if going outside Prescriptions: FLUOROURACIL 5 % CREA (FLUOROURACIL) apply 4 times daily to arms, legs and spot treat face for 14 days  #60 g x 0   Entered and Authorized by:   Julieanne Manson MD   Signed by:   Julieanne Manson MD on 11/02/2009   Method used:   Print then Give to Patient   RxID:   [id number]    Orders Added: 1)  Est. Patient Level IV [14782]

## 2010-02-01 NOTE — Letter (Signed)
Summary: REFERRAL//NUTRITION   REFERRAL//NUTRITION   Imported By: Arta Bruce 02/17/2009 14:38:44  _____________________________________________________________________  External Attachment:    Type:   Image     Comment:   External Document

## 2010-03-02 ENCOUNTER — Encounter (INDEPENDENT_AMBULATORY_CARE_PROVIDER_SITE_OTHER): Payer: Self-pay | Admitting: Internal Medicine

## 2010-03-10 NOTE — Letter (Signed)
Summary: SREVEN BERNSTORF,O.D.,P.A  SREVEN BERNSTORF,O.D.,P.A   Imported By: Arta Bruce 03/03/2010 16:26:36  _____________________________________________________________________  External Attachment:    Type:   Image     Comment:   External Document

## 2010-03-10 NOTE — Miscellaneous (Signed)
Summary: Dr. Kathrene Bongo records review.  Clinical Lists Changes  Observations: Added new observation of DIAB EYE EX: Dr. Jettie Booze, optometry:  Bilateral uncomplicated posterior vitreous detachment, otherwise no retinal abnormalities. (07/09/2009 12:00)

## 2010-03-17 LAB — POCT CARDIAC MARKERS
CKMB, poc: 1 ng/mL (ref 1.0–8.0)
Myoglobin, poc: 60.8 ng/mL (ref 12–200)
Troponin i, poc: <0.05 ng/mL (ref 0.00–0.09)

## 2010-03-17 LAB — LIPID PANEL
HDL: 39 mg/dL — ABNORMAL LOW (ref >39)
Total CHOL/HDL Ratio: 4.4 RATIO
VLDL: 29 mg/dL (ref 0–40)

## 2010-03-17 LAB — COMPREHENSIVE METABOLIC PANEL
ALT: 17 U/L (ref 0–35)
AST: 26 U/L (ref 0–37)
Albumin: 3.5 g/dL (ref 3.5–5.2)
Alkaline Phosphatase: 64 U/L (ref 39–117)
BUN: 10 mg/dL (ref 6–23)
Chloride: 107 mEq/L (ref 96–112)
GFR calc Af Amer: >60 mL/min (ref >60)
Potassium: 3.8 mEq/L (ref 3.5–5.1)
Sodium: 140 mEq/L (ref 135–145)
Total Bilirubin: 0.7 mg/dL (ref 0.3–1.2)
Total Protein: 6.7 g/dL (ref 6.0–8.3)

## 2010-03-17 LAB — URINALYSIS, ROUTINE W REFLEX MICROSCOPIC
Glucose, UA: NEGATIVE mg/dL
Nitrite: NEGATIVE
Specific Gravity, Urine: 1.004 — ABNORMAL LOW (ref 1.005–1.030)
Urobilinogen, UA: 1 mg/dL (ref 0.0–1.0)

## 2010-03-17 LAB — CBC
MCH: 32.2 pg (ref 26.0–34.0)
Platelets: 180 10*3/uL (ref 150–400)
RBC: 4.63 MIL/uL (ref 3.87–5.11)
RDW: 12.2 % (ref 11.5–15.5)

## 2010-03-17 LAB — CK TOTAL AND CKMB (NOT AT ARMC)
CK, MB: 2.1 ng/mL (ref 0.3–4.0)
Total CK: 74 U/L (ref 7–177)

## 2010-04-07 LAB — COMPREHENSIVE METABOLIC PANEL
ALT: 18 U/L (ref 0–35)
AST: 20 U/L (ref 0–37)
AST: 24 U/L (ref 0–37)
Albumin: 3.5 g/dL (ref 3.5–5.2)
Albumin: 3.7 g/dL (ref 3.5–5.2)
BUN: 13 mg/dL (ref 6–23)
Calcium: 9.1 mg/dL (ref 8.4–10.5)
Calcium: 9.5 mg/dL (ref 8.4–10.5)
Chloride: 99 mEq/L (ref 96–112)
Creatinine, Ser: 0.89 mg/dL (ref 0.4–1.2)
Creatinine, Ser: 0.95 mg/dL (ref 0.4–1.2)
GFR calc Af Amer: >60 mL/min (ref >60)
GFR calc Af Amer: >60 mL/min (ref >60)
Sodium: 136 mEq/L (ref 135–145)
Total Bilirubin: 0.5 mg/dL (ref 0.3–1.2)
Total Protein: 7.2 g/dL (ref 6.0–8.3)

## 2010-04-07 LAB — ETHANOL: Alcohol, Ethyl (B): <5 mg/dL (ref 0–10)

## 2010-04-07 LAB — CBC
HCT: 40.7 % (ref 36.0–46.0)
HCT: 42.5 % (ref 36.0–46.0)
Hemoglobin: 14.1 g/dL (ref 12.0–15.0)
MCV: 96.1 fL (ref 78.0–100.0)
Platelets: 186 10*3/uL (ref 150–400)
Platelets: 202 10*3/uL (ref 150–400)
RDW: 12.3 % (ref 11.5–15.5)
WBC: 8.9 10*3/uL (ref 4.0–10.5)
WBC: 9.4 10*3/uL (ref 4.0–10.5)

## 2010-04-07 LAB — DIFFERENTIAL
Basophils Absolute: 0.1 10*3/uL (ref 0.0–0.1)
Eosinophils Relative: 2 % (ref 0–5)
Lymphocytes Relative: 21 % (ref 12–46)
Lymphs Abs: 1.9 10*3/uL (ref 0.7–4.0)
Monocytes Absolute: 0.9 10*3/uL (ref 0.1–1.0)
Monocytes Relative: 10 % (ref 3–12)
Neutro Abs: 6.2 10*3/uL (ref 1.7–7.7)

## 2010-04-07 LAB — APTT: aPTT: 35 seconds (ref 24–37)

## 2010-04-07 LAB — CK TOTAL AND CKMB (NOT AT ARMC)
CK, MB: 1.4 ng/mL (ref 0.3–4.0)
Total CK: 66 U/L (ref 7–177)

## 2010-04-07 LAB — CARDIAC PANEL(CRET KIN+CKTOT+MB+TROPI)
CK, MB: 1.6 ng/mL (ref 0.3–4.0)
Troponin I: 0.01 ng/mL (ref 0.00–0.06)

## 2010-04-07 LAB — RAPID URINE DRUG SCREEN, HOSP PERFORMED
Amphetamines: NOT DETECTED
Barbiturates: NOT DETECTED
Benzodiazepines: NOT DETECTED

## 2010-04-07 LAB — GLUCOSE, CAPILLARY: Glucose-Capillary: 113 mg/dL — ABNORMAL HIGH (ref 70–99)

## 2010-04-07 LAB — URINALYSIS, ROUTINE W REFLEX MICROSCOPIC
Glucose, UA: NEGATIVE mg/dL
Hgb urine dipstick: NEGATIVE
Ketones, ur: NEGATIVE mg/dL
Protein, ur: NEGATIVE mg/dL
Urobilinogen, UA: 0.2 mg/dL (ref 0.0–1.0)

## 2010-04-07 LAB — HEMOGLOBIN A1C
Hgb A1c MFr Bld: 5.7 % (ref 4.6–6.1)
Mean Plasma Glucose: 117 mg/dL

## 2010-04-07 LAB — LIPID PANEL
HDL: 42 mg/dL (ref >39)
Triglycerides: 75 mg/dL (ref <150)
VLDL: 15 mg/dL (ref 0–40)

## 2010-04-07 LAB — TROPONIN I: Troponin I: <0.01 ng/mL (ref 0.00–0.06)

## 2010-04-07 LAB — HOMOCYSTEINE: Homocysteine: 10.7 umol/L (ref 4.0–15.4)

## 2010-05-17 ENCOUNTER — Observation Stay (HOSPITAL_COMMUNITY)
Admission: EM | Admit: 2010-05-17 | Discharge: 2010-05-18 | Disposition: A | Discharge status: Home / Self Care | Payer: Medicare Other | Attending: Emergency Medicine | Admitting: Emergency Medicine

## 2010-05-17 ENCOUNTER — Emergency Department (HOSPITAL_COMMUNITY): Payer: Medicare Other

## 2010-05-17 DIAGNOSIS — Z86718 Personal history of other venous thrombosis and embolism: Secondary | ICD-10-CM | POA: Insufficient documentation

## 2010-05-17 DIAGNOSIS — M79609 Pain in unspecified limb: Secondary | ICD-10-CM | POA: Insufficient documentation

## 2010-05-17 DIAGNOSIS — M7989 Other specified soft tissue disorders: Principal | ICD-10-CM | POA: Insufficient documentation

## 2010-05-17 DIAGNOSIS — Z9181 History of falling: Secondary | ICD-10-CM | POA: Insufficient documentation

## 2010-05-18 ENCOUNTER — Other Ambulatory Visit (HOSPITAL_COMMUNITY): Payer: PRIVATE HEALTH INSURANCE

## 2010-05-18 DIAGNOSIS — M79609 Pain in unspecified limb: Secondary | ICD-10-CM

## 2010-05-18 LAB — APTT: aPTT: 31 seconds (ref 24–37)

## 2010-05-18 LAB — CBC
HCT: 39.9 % (ref 36.0–46.0)
MCH: 31.3 pg (ref 26.0–34.0)
MCHC: 33.6 g/dL (ref 30.0–36.0)
MCV: 93.2 fL (ref 78.0–100.0)
Platelets: 205 10*3/uL (ref 150–400)
RDW: 12.2 % (ref 11.5–15.5)
WBC: 9.8 10*3/uL (ref 4.0–10.5)

## 2010-05-18 LAB — DIFFERENTIAL
Eosinophils Absolute: 0.4 10*3/uL (ref 0.0–0.7)
Eosinophils Relative: 4 % (ref 0–5)
Lymphocytes Relative: 33 % (ref 12–46)
Lymphs Abs: 3.2 10*3/uL (ref 0.7–4.0)
Monocytes Absolute: 0.7 10*3/uL (ref 0.1–1.0)
Monocytes Relative: 7 % (ref 3–12)

## 2010-05-18 LAB — BASIC METABOLIC PANEL
BUN: 15 mg/dL (ref 6–23)
CO2: 29 mEq/L (ref 19–32)
Chloride: 102 mEq/L (ref 96–112)
Creatinine, Ser: 0.95 mg/dL (ref 0.4–1.2)
Potassium: 3.4 mEq/L — ABNORMAL LOW (ref 3.5–5.1)

## 2010-06-17 ENCOUNTER — Other Ambulatory Visit (HOSPITAL_COMMUNITY): Payer: Self-pay | Admitting: Internal Medicine

## 2010-06-17 DIAGNOSIS — Z1231 Encounter for screening mammogram for malignant neoplasm of breast: Secondary | ICD-10-CM

## 2010-06-21 ENCOUNTER — Ambulatory Visit (HOSPITAL_COMMUNITY)
Admission: RE | Admit: 2010-06-21 | Discharge: 2010-06-21 | Disposition: A | Discharge status: Home / Self Care | Payer: Medicare Other | Source: Ambulatory Visit | Attending: Internal Medicine | Admitting: Internal Medicine

## 2010-06-21 DIAGNOSIS — Z1231 Encounter for screening mammogram for malignant neoplasm of breast: Secondary | ICD-10-CM

## 2010-07-08 ENCOUNTER — Other Ambulatory Visit (HOSPITAL_COMMUNITY): Payer: Self-pay | Admitting: Family Medicine

## 2010-07-08 DIAGNOSIS — M25569 Pain in unspecified knee: Secondary | ICD-10-CM

## 2010-07-13 ENCOUNTER — Ambulatory Visit (HOSPITAL_COMMUNITY)
Admission: RE | Admit: 2010-07-13 | Discharge: 2010-07-13 | Disposition: A | Discharge status: Home / Self Care | Payer: Medicare Other | Source: Ambulatory Visit | Attending: Family Medicine | Admitting: Family Medicine

## 2010-07-13 DIAGNOSIS — M25569 Pain in unspecified knee: Secondary | ICD-10-CM | POA: Insufficient documentation

## 2010-07-13 DIAGNOSIS — IMO0002 Reserved for concepts with insufficient information to code with codable children: Secondary | ICD-10-CM | POA: Insufficient documentation

## 2010-07-13 DIAGNOSIS — M1710 Unilateral primary osteoarthritis, unspecified knee: Secondary | ICD-10-CM | POA: Insufficient documentation

## 2010-07-13 DIAGNOSIS — M23329 Other meniscus derangements, posterior horn of medial meniscus, unspecified knee: Secondary | ICD-10-CM | POA: Insufficient documentation

## 2010-07-13 DIAGNOSIS — M6250 Muscle wasting and atrophy, not elsewhere classified, unspecified site: Secondary | ICD-10-CM | POA: Insufficient documentation

## 2010-07-13 DIAGNOSIS — M76899 Other specified enthesopathies of unspecified lower limb, excluding foot: Secondary | ICD-10-CM | POA: Insufficient documentation

## 2011-04-04 ENCOUNTER — Encounter (HOSPITAL_COMMUNITY): Payer: Self-pay | Admitting: Pharmacy Technician

## 2011-04-06 ENCOUNTER — Encounter (HOSPITAL_COMMUNITY): Payer: Self-pay

## 2011-04-06 ENCOUNTER — Encounter (HOSPITAL_COMMUNITY)
Admission: RE | Admit: 2011-04-06 | Discharge: 2011-04-06 | Disposition: A | Discharge status: Home / Self Care | Payer: Medicare Other | Source: Ambulatory Visit | Attending: Orthopedic Surgery | Admitting: Orthopedic Surgery

## 2011-04-06 ENCOUNTER — Ambulatory Visit (HOSPITAL_COMMUNITY)
Admission: RE | Admit: 2011-04-06 | Discharge: 2011-04-06 | Disposition: A | Discharge status: Home / Self Care | Payer: Medicare Other | Source: Ambulatory Visit | Attending: Orthopedic Surgery | Admitting: Orthopedic Surgery

## 2011-04-06 DIAGNOSIS — Z01812 Encounter for preprocedural laboratory examination: Secondary | ICD-10-CM | POA: Insufficient documentation

## 2011-04-06 DIAGNOSIS — M1710 Unilateral primary osteoarthritis, unspecified knee: Secondary | ICD-10-CM | POA: Insufficient documentation

## 2011-04-06 DIAGNOSIS — Z01818 Encounter for other preprocedural examination: Secondary | ICD-10-CM | POA: Insufficient documentation

## 2011-04-06 DIAGNOSIS — I10 Essential (primary) hypertension: Secondary | ICD-10-CM | POA: Insufficient documentation

## 2011-04-06 HISTORY — DX: Cerebral infarction, unspecified: I63.9

## 2011-04-06 HISTORY — DX: Shortness of breath: R06.02

## 2011-04-06 HISTORY — DX: Unspecified osteoarthritis, unspecified site: M19.90

## 2011-04-06 HISTORY — DX: Sleep apnea, unspecified: G47.30

## 2011-04-06 HISTORY — DX: Essential (primary) hypertension: I10

## 2011-04-06 LAB — URINALYSIS, ROUTINE W REFLEX MICROSCOPIC
Bilirubin Urine: NEGATIVE
Glucose, UA: NEGATIVE mg/dL
Hgb urine dipstick: NEGATIVE
Ketones, ur: NEGATIVE mg/dL
pH: 5.5 (ref 5.0–8.0)

## 2011-04-06 LAB — APTT: aPTT: 35 seconds (ref 24–37)

## 2011-04-06 LAB — SURGICAL PCR SCREEN
MRSA, PCR: NEGATIVE
Staphylococcus aureus: NEGATIVE

## 2011-04-06 NOTE — Pre-Procedure Instructions (Signed)
PT'S CBC WITH DIFF AND CMET  AND EKG REPORTS  AND NOTE OF MEDICAL CLEARANCE -ARE ON PT'S CHART FROM DR. AMAO--WERE DONE 03/22/11. PT, PTT, UA, AND CXR WERE DONE TODAY  PREOP AT [facility]--T/S WILL BE DONE DAY OF SURGERY.

## 2011-04-06 NOTE — Patient Instructions (Signed)
YOUR SURGERY IS SCHEDULED ON:  Monday  4/15  AT 7:15 AM  REPORT TO [facility] AT:  5:15 AM      PHONE # FOR SHORT STAY IS [phone]  DO NOT EAT OR DRINK ANYTHING AFTER MIDNIGHT THE NIGHT BEFORE YOUR SURGERY.  YOU MAY BRUSH YOUR TEETH, RINSE OUT YOUR MOUTH--BUT NO WATER, NO FOOD, NO CHEWING GUM, NO MINTS, NO CANDIES, NO CHEWING TOBACCO.  PLEASE TAKE THE FOLLOWING MEDICATIONS THE AM OF YOUR SURGERY WITH A FEW SIPS OF WATER:  NO MEDICINES TO TAKE THE AM OF YOUR SURGERY    IF YOU USE INHALERS--USE YOUR INHALERS THE AM OF YOUR SURGERY AND BRING INHALERS TO THE HOSPITAL -TAKE TO SURGERY.    IF YOU ARE DIABETIC:  DO NOT TAKE ANY DIABETIC MEDICATIONS THE AM OF YOUR SURGERY.  IF YOU TAKE INSULIN IN THE EVENINGS--PLEASE ONLY TAKE 1/2 NORMAL EVENING DOSE THE NIGHT BEFORE YOUR SURGERY.  NO INSULIN THE AM OF YOUR SURGERY.  IF YOU HAVE SLEEP APNEA AND USE CPAP OR BIPAP--PLEASE BRING THE MASK --NOT THE MACHINE-NOT THE TUBING   -JUST THE MASK. DO NOT BRING VALUABLES, MONEY, CREDIT CARDS.  CONTACT LENS, DENTURES / PARTIALS, GLASSES SHOULD NOT BE WORN TO SURGERY AND IN MOST CASES-HEARING AIDS WILL NEED TO BE REMOVED.  BRING YOUR GLASSES CASE, ANY EQUIPMENT NEEDED FOR YOUR CONTACT LENS. FOR PATIENTS ADMITTED TO THE HOSPITAL--CHECK OUT TIME THE DAY OF DISCHARGE IS 11:00 AM.  ALL INPATIENT ROOMS ARE PRIVATE - WITH BATHROOM, TELEPHONE, TELEVISION AND WIFI INTERNET. IF YOU ARE BEING DISCHARGED THE SAME DAY OF YOUR SURGERY--YOU CAN NOT DRIVE YOURSELF HOME--AND SHOULD NOT GO HOME ALONE BY TAXI OR BUS.  NO DRIVING OR OPERATING MACHINERY FOR 24 HOURS FOLLOWING ANESTHESIA / PAIN MEDICATIONS.                            SPECIAL INSTRUCTIONS:  CHLORHEXIDINE SOAP SHOWER (other brand names are Betasept and Hibiclens ) PLEASE SHOWER WITH CHLORHEXIDINE THE NIGHT BEFORE YOUR SURGERY AND THE AM OF YOUR SURGERY. DO NOT USE CHLORHEXIDINE ON YOUR FACE OR PRIVATE AREAS--YOU MAY USE YOUR NORMAL SOAP THOSE AREAS  AND YOUR NORMAL SHAMPOO.  WOMEN SHOULD AVOID SHAVING UNDER ARMS AND SHAVING LEGS 48 HOURS BEFORE USING CHLORHEXIDINE TO AVOID SKIN IRRITATION.  DO NOT USE IF ALLERGIC TO CHLORHEXIDINE.  PLEASE READ OVER ANY  FACT SHEETS THAT YOU WERE GIVEN: MRSA INFORMATION, BLOOD TRANSFUSION INFORMATION, INCENTIVE SPIROMETER INFORMATION.

## 2011-04-07 ENCOUNTER — Encounter (HOSPITAL_COMMUNITY): Payer: Self-pay

## 2011-04-16 ENCOUNTER — Other Ambulatory Visit: Payer: Self-pay | Admitting: Orthopedic Surgery

## 2011-04-16 NOTE — H&P (Signed)
Shelby Wiley  DOB: 09/01/1935 Single / Language: English / Race: White Female  Date of Admission:  04/17/2011  Chief Complaint:  Right Knee Pain  History of Present Illness The patient is a 75 year old female who comes in for a preoperative History and Physical. The patient is scheduled for a right total knee arthroplasty to be performed by Dr. Frank V. Aluisio, MD at  Hospital on 04/17/2011. The patient is a 76 year old female who presents with knee complaints. The patient is seen in referral from Dr. Supple. The patient reports left knee and right knee symptoms including: pain and instability which began 4 month(s) ago without any known injury.The patient feels that the symptoms are worsening. The patient has the current diagnosis of knee osteoarthritis. Prior to being seen today the patient was previously evaluated in this clinic (after a visit to the emergency room). Previous work-up for this problem has included knee x-rays (on the left) and knee MRI (bilateral). Current treatment includes non-opioid analgesics (Tylenol prn). [name] states that both knees bother her tremendously. She's at a stage now where she can't tolerate the pain anymore. She's ready to get them fixed. Both knees hurt equally. The left knee has been hurting for a longer time. She said she had regular x-rays about 1 1/2 years ago and evaluation of the knees. She then had the fall back in the spring of this year and initially hadh plain films done of the left knee which showed bad arthritis but no fractures. She then had an MRI of both knees which showed severe degenerative changes in both knees, left a little worse than the right. At this ponit, the right knee is now more problematic and would like to proceed with knee replacement. They have been treated conservatively in the past for the above stated problem and despite conservative measures, they continue to have progressive pain and severe functional  limitations and dysfunction. They have failed non-operative management. It is felt that they would benefit from undergoing total joint replacement. Risks and benefits of the procedure have been discussed with the patient and they elect to proceed with surgery. There are no active contraindications to surgery such as ongoing infection or rapidly progressive neurological disease.  Allergies CODIENE. Vomiting.  Medication History Hydrochlorothiazide (12.5MG Tablet, Oral daily) Active. Calcium-Vitamin D (250MG Capsule, Oral) Active. Acetaminophen (325MG Capsule, Oral) Active. Aspirin EC (325MG Tablet DR, Oral) Active. Theratears (0.25% Solution, Ophthalmic) Active.  Problem List/Past Medical Osteoarthritis, knee (715.96) Tear, medial meniscus, knee, current (836.0). 01/10/1991 Hx of transient ischemic attack (TIA) (V12.54) Osteoarthritis High blood pressure  Past Surgical History Cataract Surgery. bilateral Arthroscopy of Knee. left  Family History Heart disease in female family member before age 55 Heart Disease. brother (MI) Cancer. sister (breast) Cerebrovascular Accident. mother  Social History Copy of Drug/Alcohol Rehab (Previously). no Current work status. retired Children. 2 Exercise. Exercises rarely Illicit drug use. no Living situation. live alone Marital status. single Number of flights of stairs before winded. less than 1 Pain Contract. no Tobacco use. former smoker; smoke(d) 3/4 pack(s) per day; Quit 8-10 years ago Drug/Alcohol Rehab (Currently). no Tobacco / smoke exposure. no  Review of Systems General:Not Present- Chills, Fever, Night Sweats, Fatigue, Weight Gain, Weight Loss and Memory Loss. Skin:Not Present- Hives, Itching, Rash, Eczema and Lesions. HEENT:Not Present- Tinnitus, Headache, Double Vision, Visual Loss, Hearing Loss and Dentures. Respiratory:Not Present- Shortness of breath with exertion, Shortness of breath at rest,  Allergies, Coughing up blood and   Chronic Cough. Cardiovascular:Not Present- Chest Pain, Racing/skipping heartbeats, Difficulty Breathing Lying Down, Murmur, Swelling and Palpitations. Gastrointestinal:Not Present- Bloody Stool, Heartburn, Abdominal Pain, Vomiting, Nausea, Constipation, Diarrhea, Difficulty Swallowing, Jaundice and Loss of appetitie. Female Genitourinary:Not Present- Blood in Urine, Urinary frequency, Weak urinary stream, Discharge, Flank Pain, Incontinence, Painful Urination, Urgency, Urinary Retention and Urinating at Night. Musculoskeletal:Not Present- Muscle Weakness, Muscle Pain, Joint Swelling, Joint Pain, Back Pain, Morning Stiffness and Spasms. Neurological:Not Present- Tremor, Dizziness, Blackout spells, Paralysis, Difficulty with balance and Weakness. Psychiatric:Not Present- Insomnia.  Vitals 03/28/2011 2:43 PM Pulse: 84 (Regular) Resp.: 16 (Unlabored) BP: 134/84 (Sitting, Right Arm, Standard)  Physical Exam The physical exam findings are as follows:  General Mental Status - Alert, cooperative and good historian. General Appearance- pleasant. Not in acute distress. Orientation- Oriented X3. Build & Nutrition- Well nourished and Well developed.   Head and Neck Head- normocephalic, atraumatic . Neck Global Assessment- supple. no bruit auscultated on the right and no bruit auscultated on the left.   Eye Pupil- Bilateral- Regular and Round. Motion- Bilateral- EOMI.   Chest and Lung Exam Auscultation: Breath sounds:- clear at anterior chest wall and - clear at posterior chest wall. Adventitious sounds:- No Adventitious sounds.   Cardiovascular Auscultation:Rhythm- Regular rate and rhythm. Heart Sounds- S1 WNL and S2 WNL. Murmurs & Other Heart Sounds:Auscultation of the heart reveals - No Murmurs.   Abdomen Inspection:Contour- Generalized moderate distention. Palpation/Percussion:Tenderness- Abdomen is non-tender  to palpation. Rigidity (guarding)- Abdomen is soft. Auscultation:Auscultation of the abdomen reveals - Bowel sounds normal.   Female Genitourinary Not done, not pertinent to present illness  Musculoskeletal On exam, well-developed female, alert and oriented, in no apparent distress. Her hips show normal ROM with no discomfort. Her left knee shows no effusion. Range is about 5-120. There is marked crepitus on ROM. There is no instability noted. The right knee shows no effusion, slight varus, 5-125. Tender medial greater than lateral with no instability noted. Pulses, sensation and motor are intact, both lower extremities. Gait pattern is significantly slow, shuffling and antalgic.  RADIOGRAPHS: AP and lateral of both, show advanced end stage arthritis of the left worse than right knee.  Assessment & Plan Osteoarthritis Bilateral Knees  Patient is for a Right Total Knee Arthroplasty by Dr. Aluisio.  Patient wants to look into [facility] after the hospital.  PCP - Dr. Enobong Amao  Drew Kijuana Ruppel, PA-C  

## 2011-04-17 ENCOUNTER — Encounter (HOSPITAL_COMMUNITY): Payer: Self-pay | Admitting: Anesthesiology

## 2011-04-17 ENCOUNTER — Inpatient Hospital Stay (HOSPITAL_COMMUNITY)
Admission: RE | Admit: 2011-04-17 | Discharge: 2011-04-20 | DRG: 470 | Disposition: A | Discharge status: Skilled Nursing Facility | Payer: Medicare Other | Source: Ambulatory Visit | Attending: Orthopedic Surgery | Admitting: Orthopedic Surgery

## 2011-04-17 ENCOUNTER — Encounter (HOSPITAL_COMMUNITY): Payer: Self-pay | Admitting: *Deleted

## 2011-04-17 ENCOUNTER — Inpatient Hospital Stay (HOSPITAL_COMMUNITY): Payer: Medicare Other | Admitting: Anesthesiology

## 2011-04-17 ENCOUNTER — Encounter (HOSPITAL_COMMUNITY)
Admission: RE | Disposition: A | Discharge status: Skilled Nursing Facility | Payer: Self-pay | Source: Ambulatory Visit | Attending: Orthopedic Surgery

## 2011-04-17 DIAGNOSIS — Z6841 Body Mass Index (BMI) 40.0 and over, adult: Secondary | ICD-10-CM

## 2011-04-17 DIAGNOSIS — M179 Osteoarthritis of knee, unspecified: Secondary | ICD-10-CM | POA: Diagnosis present

## 2011-04-17 DIAGNOSIS — I10 Essential (primary) hypertension: Secondary | ICD-10-CM | POA: Diagnosis present

## 2011-04-17 DIAGNOSIS — Z96659 Presence of unspecified artificial knee joint: Secondary | ICD-10-CM

## 2011-04-17 DIAGNOSIS — E876 Hypokalemia: Secondary | ICD-10-CM

## 2011-04-17 DIAGNOSIS — M1710 Unilateral primary osteoarthritis, unspecified knee: Secondary | ICD-10-CM | POA: Diagnosis present

## 2011-04-17 HISTORY — PX: TOTAL KNEE ARTHROPLASTY: SHX125

## 2011-04-17 LAB — TYPE AND SCREEN
ABO/RH(D): A POS
Antibody Screen: NEGATIVE

## 2011-04-17 SURGERY — ARTHROPLASTY, KNEE, TOTAL
Anesthesia: General | Site: Knee | Laterality: Right | Wound class: Clean

## 2011-04-17 MED ORDER — POTASSIUM CHLORIDE IN NACL 20-0.9 MEQ/L-% IV SOLN
INTRAVENOUS | Status: DC
  Administered 2011-04-17: 1000 mL via INTRAVENOUS
  Administered 2011-04-17: 23:00:00 via INTRAVENOUS
  Filled 2011-04-17 (×3): qty 1000

## 2011-04-17 MED ORDER — BISACODYL 10 MG RE SUPP: 10.0000 mg | Freq: Every day | RECTAL | Status: DC | PRN

## 2011-04-17 MED ORDER — DIPHENHYDRAMINE HCL 12.5 MG/5ML PO ELIX
12.5000 mg | ORAL_SOLUTION | Freq: Four times a day (QID) | ORAL | Status: DC | PRN
  Filled 2011-04-17: qty 5

## 2011-04-17 MED ORDER — DIPHENHYDRAMINE HCL 12.5 MG/5ML PO ELIX: 12.5000 mg | ORAL_SOLUTION | ORAL | Status: DC | PRN

## 2011-04-17 MED ORDER — ACETAMINOPHEN 10 MG/ML IV SOLN
1000.0000 mg | Freq: Four times a day (QID) | INTRAVENOUS | Status: AC
  Administered 2011-04-17 – 2011-04-18 (×4): 1000 mg via INTRAVENOUS
  Filled 2011-04-17 (×5): qty 100

## 2011-04-17 MED ORDER — CARBOXYMETHYLCELLULOSE SODIUM 0.25 % OP SOLN: 3.0000 [drp] | Freq: Two times a day (BID) | OPHTHALMIC | Status: DC | PRN

## 2011-04-17 MED ORDER — HYDROCHLOROTHIAZIDE 12.5 MG PO CAPS
12.5000 mg | ORAL_CAPSULE | Freq: Every day | ORAL | Status: DC
  Administered 2011-04-18 – 2011-04-19 (×2): 12.5 mg via ORAL
  Filled 2011-04-17 (×4): qty 1

## 2011-04-17 MED ORDER — WARFARIN SODIUM 5 MG PO TABS
5.0000 mg | ORAL_TABLET | Freq: Once | ORAL | Status: AC
  Administered 2011-04-17: 5 mg via ORAL
  Filled 2011-04-17: qty 1

## 2011-04-17 MED ORDER — ETOMIDATE 2 MG/ML IV SOLN
INTRAVENOUS | Status: DC | PRN
  Administered 2011-04-17: 16 mg via INTRAVENOUS

## 2011-04-17 MED ORDER — FLEET ENEMA 7-19 GM/118ML RE ENEM: 1.0000 | ENEMA | Freq: Once | RECTAL | Status: AC | PRN

## 2011-04-17 MED ORDER — METOCLOPRAMIDE HCL 5 MG/ML IJ SOLN
5.0000 mg | Freq: Three times a day (TID) | INTRAMUSCULAR | Status: DC | PRN
  Administered 2011-04-17: 10 mg via INTRAVENOUS
  Filled 2011-04-17: qty 2

## 2011-04-17 MED ORDER — ONDANSETRON HCL 4 MG/2ML IJ SOLN
4.0000 mg | Freq: Four times a day (QID) | INTRAMUSCULAR | Status: DC | PRN
  Filled 2011-04-17: qty 2

## 2011-04-17 MED ORDER — METHOCARBAMOL 500 MG PO TABS
500.0000 mg | ORAL_TABLET | Freq: Four times a day (QID) | ORAL | Status: DC | PRN
  Administered 2011-04-18 – 2011-04-20 (×2): 500 mg via ORAL
  Filled 2011-04-17 (×2): qty 1

## 2011-04-17 MED ORDER — POLYVINYL ALCOHOL 1.4 % OP SOLN
3.0000 [drp] | Freq: Two times a day (BID) | OPHTHALMIC | Status: DC | PRN
  Filled 2011-04-17: qty 15

## 2011-04-17 MED ORDER — PATIENT'S GUIDE TO USING COUMADIN BOOK
Freq: Once | Status: AC
  Administered 2011-04-17: 18:00:00
  Filled 2011-04-17: qty 1

## 2011-04-17 MED ORDER — ACETAMINOPHEN 650 MG RE SUPP: 650.0000 mg | Freq: Four times a day (QID) | RECTAL | Status: DC | PRN

## 2011-04-17 MED ORDER — LACTATED RINGERS IV SOLN
INTRAVENOUS | Status: DC | PRN
  Administered 2011-04-17 (×2): via INTRAVENOUS

## 2011-04-17 MED ORDER — NALOXONE HCL 0.4 MG/ML IJ SOLN: 0.4000 mg | INTRAMUSCULAR | Status: DC | PRN

## 2011-04-17 MED ORDER — CEFAZOLIN SODIUM 1-5 GM-% IV SOLN
1.0000 g | Freq: Four times a day (QID) | INTRAVENOUS | Status: AC
  Administered 2011-04-17 – 2011-04-18 (×3): 1 g via INTRAVENOUS
  Filled 2011-04-17 (×4): qty 50

## 2011-04-17 MED ORDER — POLYETHYLENE GLYCOL 3350 17 G PO PACK
17.0000 g | PACK | Freq: Every day | ORAL | Status: DC | PRN
  Filled 2011-04-17: qty 1

## 2011-04-17 MED ORDER — SUCCINYLCHOLINE CHLORIDE 20 MG/ML IJ SOLN
INTRAMUSCULAR | Status: DC | PRN
  Administered 2011-04-17: 100 mg via INTRAVENOUS

## 2011-04-17 MED ORDER — DEXTROSE 5 % IV SOLN
500.0000 mg | Freq: Four times a day (QID) | INTRAVENOUS | Status: DC | PRN
  Administered 2011-04-17 – 2011-04-18 (×3): 500 mg via INTRAVENOUS
  Filled 2011-04-17 (×3): qty 5

## 2011-04-17 MED ORDER — BUPIVACAINE ON-Q PAIN PUMP (FOR ORDER SET NO CHG)
INJECTION | Status: DC
  Filled 2011-04-17: qty 1

## 2011-04-17 MED ORDER — DOCUSATE SODIUM 100 MG PO CAPS
100.0000 mg | ORAL_CAPSULE | Freq: Two times a day (BID) | ORAL | Status: DC
  Administered 2011-04-18 – 2011-04-20 (×5): 100 mg via ORAL
  Filled 2011-04-17 (×8): qty 1

## 2011-04-17 MED ORDER — LACTATED RINGERS IV SOLN: INTRAVENOUS | Status: DC

## 2011-04-17 MED ORDER — FENTANYL CITRATE 0.05 MG/ML IJ SOLN
INTRAMUSCULAR | Status: DC | PRN
  Administered 2011-04-17 (×2): 50 ug via INTRAVENOUS
  Administered 2011-04-17: 100 ug via INTRAVENOUS
  Administered 2011-04-17: 50 ug via INTRAVENOUS

## 2011-04-17 MED ORDER — ACETAMINOPHEN 325 MG PO TABS: 650.0000 mg | ORAL_TABLET | Freq: Four times a day (QID) | ORAL | Status: DC | PRN

## 2011-04-17 MED ORDER — HYDROMORPHONE HCL PF 1 MG/ML IJ SOLN
0.2500 mg | INTRAMUSCULAR | Status: DC | PRN
  Administered 2011-04-17 (×2): 0.5 mg via INTRAVENOUS

## 2011-04-17 MED ORDER — ENOXAPARIN SODIUM 30 MG/0.3ML ~~LOC~~ SOLN
30.0000 mg | Freq: Two times a day (BID) | SUBCUTANEOUS | Status: DC
  Administered 2011-04-18 – 2011-04-20 (×5): 30 mg via SUBCUTANEOUS
  Filled 2011-04-17 (×6): qty 0.3

## 2011-04-17 MED ORDER — SODIUM CHLORIDE 0.9 % IR SOLN
Status: DC | PRN
  Administered 2011-04-17: 1000 mL

## 2011-04-17 MED ORDER — DIPHENHYDRAMINE HCL 50 MG/ML IJ SOLN: 12.5000 mg | Freq: Four times a day (QID) | INTRAMUSCULAR | Status: DC | PRN

## 2011-04-17 MED ORDER — HYDROMORPHONE HCL PF 1 MG/ML IJ SOLN
INTRAMUSCULAR | Status: DC | PRN
  Administered 2011-04-17 (×2): 0.5 mg via INTRAVENOUS

## 2011-04-17 MED ORDER — METOCLOPRAMIDE HCL 10 MG PO TABS: 5.0000 mg | ORAL_TABLET | Freq: Three times a day (TID) | ORAL | Status: DC | PRN

## 2011-04-17 MED ORDER — BUPIVACAINE 0.25 % ON-Q PUMP SINGLE CATH 300ML
300.0000 mL | INJECTION | Status: DC
  Administered 2011-04-17: 300 mL

## 2011-04-17 MED ORDER — MENTHOL 3 MG MT LOZG
1.0000 | LOZENGE | OROMUCOSAL | Status: DC | PRN
  Filled 2011-04-17: qty 9

## 2011-04-17 MED ORDER — OXYCODONE HCL 5 MG PO TABS
5.0000 mg | ORAL_TABLET | ORAL | Status: DC | PRN
  Administered 2011-04-17 – 2011-04-20 (×7): 5 mg via ORAL
  Filled 2011-04-17 (×8): qty 1

## 2011-04-17 MED ORDER — SODIUM CHLORIDE 0.9 % IJ SOLN: 9.0000 mL | INTRAMUSCULAR | Status: DC | PRN

## 2011-04-17 MED ORDER — ZOLPIDEM TARTRATE 5 MG PO TABS
5.0000 mg | ORAL_TABLET | Freq: Every evening | ORAL | Status: DC | PRN
Start: 2011-04-17 — End: 2011-04-20

## 2011-04-17 MED ORDER — WARFARIN VIDEO
Freq: Once | Status: AC
  Administered 2011-04-18: 12:00:00

## 2011-04-17 MED ORDER — WARFARIN - PHARMACIST DOSING INPATIENT: Freq: Every day | Status: DC

## 2011-04-17 MED ORDER — ONDANSETRON HCL 4 MG/2ML IJ SOLN
4.0000 mg | Freq: Four times a day (QID) | INTRAMUSCULAR | Status: DC | PRN
  Administered 2011-04-17: 4 mg via INTRAVENOUS

## 2011-04-17 MED ORDER — CEFAZOLIN SODIUM-DEXTROSE 2-3 GM-% IV SOLR
2.0000 g | Freq: Once | INTRAVENOUS | Status: AC
  Administered 2011-04-17: 2 g via INTRAVENOUS

## 2011-04-17 MED ORDER — ONDANSETRON HCL 4 MG PO TABS: 4.0000 mg | ORAL_TABLET | Freq: Four times a day (QID) | ORAL | Status: DC | PRN

## 2011-04-17 MED ORDER — TEMAZEPAM 15 MG PO CAPS: 15.0000 mg | ORAL_CAPSULE | Freq: Every evening | ORAL | Status: DC | PRN

## 2011-04-17 MED ORDER — MORPHINE SULFATE (PF) 1 MG/ML IV SOLN
INTRAVENOUS | Status: DC
  Administered 2011-04-17 (×2): 2 mg via INTRAVENOUS
  Administered 2011-04-17: 1 mg via INTRAVENOUS
  Administered 2011-04-17: 4 mg via INTRAVENOUS
  Administered 2011-04-17: 1 mg via INTRAVENOUS
  Administered 2011-04-17: 2 mg via INTRAVENOUS
  Administered 2011-04-17: 4 mg via INTRAVENOUS
  Administered 2011-04-17: 2 mg via INTRAVENOUS
  Administered 2011-04-18: 1.14 mg via INTRAVENOUS
  Administered 2011-04-18: 5.8 mg via INTRAVENOUS
  Filled 2011-04-17: qty 25

## 2011-04-17 MED ORDER — PHENOL 1.4 % MT LIQD
1.0000 | OROMUCOSAL | Status: DC | PRN
  Filled 2011-04-17: qty 177

## 2011-04-17 SURGICAL SUPPLY — 53 items
BAG SPEC THK2 15X12 ZIP CLS (MISCELLANEOUS) ×1
BAG ZIPLOCK 12X15 (MISCELLANEOUS) ×2 IMPLANT
BANDAGE ELASTIC 6 VELCRO ST LF (GAUZE/BANDAGES/DRESSINGS) ×2 IMPLANT
BANDAGE ESMARK 6X9 LF (GAUZE/BANDAGES/DRESSINGS) ×1 IMPLANT
BLADE SAG 18X100X1.27 (BLADE) ×2 IMPLANT
BLADE SAW SGTL 11.0X1.19X90.0M (BLADE) ×2 IMPLANT
BNDG CMPR 9X6 STRL LF SNTH (GAUZE/BANDAGES/DRESSINGS) ×1
BNDG ESMARK 6X9 LF (GAUZE/BANDAGES/DRESSINGS) ×2
BOWL SMART MIX CTS (DISPOSABLE) ×2 IMPLANT
CATH KIT ON-Q SILVERSOAK 5 (CATHETERS) ×1 IMPLANT
CATH KIT ON-Q SILVERSOAK 5IN (CATHETERS) ×2 IMPLANT
CEMENT HV SMART SET (Cement) ×4 IMPLANT
CLOTH BEACON ORANGE TIMEOUT ST (SAFETY) ×2 IMPLANT
CUFF TOURN SGL QUICK 34 (TOURNIQUET CUFF) ×2
CUFF TRNQT CYL 34X4X40X1 (TOURNIQUET CUFF) ×1 IMPLANT
DRAPE EXTREMITY T 121X128X90 (DRAPE) ×2 IMPLANT
DRAPE POUCH INSTRU U-SHP 10X18 (DRAPES) ×2 IMPLANT
DRAPE U-SHAPE 47X51 STRL (DRAPES) ×2 IMPLANT
DRSG ADAPTIC 3X8 NADH LF (GAUZE/BANDAGES/DRESSINGS) ×2 IMPLANT
DURAPREP 26ML APPLICATOR (WOUND CARE) ×2 IMPLANT
ELECT REM PT RETURN 9FT ADLT (ELECTROSURGICAL) ×2
ELECTRODE REM PT RTRN 9FT ADLT (ELECTROSURGICAL) ×1 IMPLANT
EVACUATOR 1/8 PVC DRAIN (DRAIN) ×2 IMPLANT
FACESHIELD LNG OPTICON STERILE (SAFETY) ×10 IMPLANT
GLOVE BIO SURGEON STRL SZ7.5 (GLOVE) ×2 IMPLANT
GLOVE BIO SURGEON STRL SZ8 (GLOVE) ×2 IMPLANT
GLOVE BIOGEL PI IND STRL 8 (GLOVE) ×2 IMPLANT
GLOVE BIOGEL PI INDICATOR 8 (GLOVE) ×2
GOWN STRL NON-REIN LRG LVL3 (GOWN DISPOSABLE) ×2 IMPLANT
GOWN STRL REIN XL XLG (GOWN DISPOSABLE) ×2 IMPLANT
HANDPIECE INTERPULSE COAX TIP (DISPOSABLE) ×2
IMMOBILIZER KNEE 20 (SOFTGOODS) ×2
IMMOBILIZER KNEE 20 THIGH 36 (SOFTGOODS) ×1 IMPLANT
KIT BASIN OR (CUSTOM PROCEDURE TRAY) ×2 IMPLANT
MANIFOLD NEPTUNE II (INSTRUMENTS) ×2 IMPLANT
NS IRRIG 1000ML POUR BTL (IV SOLUTION) ×2 IMPLANT
PACK TOTAL JOINT (CUSTOM PROCEDURE TRAY) ×2 IMPLANT
PAD ABD 7.5X8 STRL (GAUZE/BANDAGES/DRESSINGS) ×2 IMPLANT
PADDING CAST COTTON 6X4 STRL (CAST SUPPLIES) ×6 IMPLANT
POSITIONER SURGICAL ARM (MISCELLANEOUS) ×2 IMPLANT
SET HNDPC FAN SPRY TIP SCT (DISPOSABLE) ×1 IMPLANT
SPONGE GAUZE 4X4 12PLY (GAUZE/BANDAGES/DRESSINGS) ×2 IMPLANT
STRIP CLOSURE SKIN 1/2X4 (GAUZE/BANDAGES/DRESSINGS) ×4 IMPLANT
SUCTION FRAZIER 12FR DISP (SUCTIONS) ×2 IMPLANT
SUT MNCRL AB 4-0 PS2 18 (SUTURE) ×2 IMPLANT
SUT PDS AB 1 CT1 27 (SUTURE) ×6 IMPLANT
SUT VIC AB 2-0 CT1 27 (SUTURE) ×6
SUT VIC AB 2-0 CT1 TAPERPNT 27 (SUTURE) ×3 IMPLANT
SUT VLOC 180 0 24IN GS25 (SUTURE) ×2 IMPLANT
TOWEL OR 17X26 10 PK STRL BLUE (TOWEL DISPOSABLE) ×4 IMPLANT
TRAY FOLEY CATH 14FRSI W/METER (CATHETERS) ×2 IMPLANT
WATER STERILE IRR 1500ML POUR (IV SOLUTION) ×2 IMPLANT
WRAP KNEE MAXI GEL POST OP (GAUZE/BANDAGES/DRESSINGS) ×4 IMPLANT

## 2011-04-17 NOTE — Anesthesia Postprocedure Evaluation (Signed)
  Anesthesia Post-op Note  Patient: Shelby Wiley  Procedure(s) Performed: Procedure(s) (LRB): TOTAL KNEE ARTHROPLASTY (Right)  Patient Location: PACU  Anesthesia Type: General  Level of Consciousness: oriented and sedated  Airway and Oxygen Therapy: Patient Spontanous Breathing and Patient connected to nasal cannula oxygen  Post-op Pain: mild  Post-op Assessment: Post-op Vital signs reviewed, Patient's Cardiovascular Status Stable, Respiratory Function Stable and Patent Airway  Post-op Vital Signs: stable  Complications: No apparent anesthesia complications

## 2011-04-17 NOTE — Transfer of Care (Signed)
Immediate Anesthesia Transfer of Care Note  Patient: Shelby Wiley  Procedure(s) Performed: Procedure(s) (LRB): TOTAL KNEE ARTHROPLASTY (Right)  Patient Location: PACU  Anesthesia Type: General  Level of Consciousness: awake, sedated and patient cooperative  Airway & Oxygen Therapy: Patient Spontanous Breathing and Patient connected to face mask oxygen  Post-op Assessment: Report given to PACU RN and Post -op Vital signs reviewed and stable  Post vital signs: Reviewed and stable  Complications: No apparent anesthesia complications

## 2011-04-17 NOTE — Anesthesia Preprocedure Evaluation (Addendum)
Anesthesia Evaluation  Patient identified by MRN, date of birth, ID band Patient awake    Reviewed: Allergy & Precautions, H&P , NPO status , Patient's Chart, lab work & pertinent test results, reviewed documented beta blocker date and time   Airway Mallampati: II TM Distance: >3 FB     Dental  (+) Teeth Intact and Dental Advisory Given   Pulmonary neg pulmonary ROS,  Pt denies OSA breath sounds clear to auscultation        Cardiovascular hypertension, Pt. on medications Rhythm:Regular Rate:Normal  Denies cardiac symptoms Given medical clearance   Neuro/Psych TIA 2012, denies neurologic deficits TIAnegative psych ROS   GI/Hepatic negative GI ROS, Neg liver ROS,   Endo/Other  Morbid obesity  Renal/GU negative Renal ROS  negative genitourinary   Musculoskeletal negative musculoskeletal ROS (+)   Abdominal   Peds negative pediatric ROS (+)  Hematology negative hematology ROS (+)   Anesthesia Other Findings   Reproductive/Obstetrics negative OB ROS                          Anesthesia Physical Anesthesia Plan  ASA: III  Anesthesia Plan: General   Post-op Pain Management:    Induction: Intravenous  Airway Management Planned: Oral ETT  Additional Equipment:   Intra-op Plan:   Post-operative Plan: Extubation in OR  Informed Consent: I have reviewed the patients History and Physical, chart, labs and discussed the procedure including the risks, benefits and alternatives for the proposed anesthesia with the patient or authorized representative who has indicated his/her understanding and acceptance.   Dental advisory given  Plan Discussed with: CRNA and Surgeon  Anesthesia Plan Comments:         Anesthesia Quick Evaluation

## 2011-04-17 NOTE — Progress Notes (Signed)
Utilization review completed.  

## 2011-04-17 NOTE — Op Note (Signed)
Pre-operative diagnosis- Osteoarthritis  Right knee(s)  Post-operative diagnosis- Osteoarthritis Right knee(s)  Procedure-  Right  Total Knee Arthroplasty  Surgeon- Gus Rankin. Hazem Kenner, MD  Assistant- Dimitri Ped, PA-C   Anesthesia-  General EBL-* No blood loss amount entered *  Drains Hemovac  Tourniquet time-  Total Tourniquet Time Documented: Thigh (Right) - 41 minutes   Complications- None  Condition-PACU - hemodynamically stable.   Brief Clinical Note  Shelby Wiley is a 76 y.o. year old female with end stage OA of her right knee with progressively worsening pain and dysfunction. She has constant pain, with activity and at rest and significant functional deficits with difficulties even with ADLs. She has had extensive non-op management including analgesics, injections of cortisone, and home exercise program, but remains in significant pain with significant dysfunction.Radiographs show bone on bone arthritis medial and patellofemoral with tibial subluxation. She presents now for left Total Knee Arthroplasty.    Procedure in detail---   The patient is brought into the operating room and positioned supine on the operating table. After successful administration of  General,   a tourniquet is placed high on the  Right thigh(s) and the lower extremity is prepped and draped in the usual sterile fashion. Time out is performed by the operating team and then the  Right lower extremity is wrapped in Esmarch, knee flexed and the tourniquet inflated to 300 mmHg.       A midline incision is made with a ten blade through the subcutaneous tissue to the level of the extensor mechanism. A fresh blade is used to make a medial parapatellar arthrotomy. Soft tissue over the proximal medial tibia is subperiosteally elevated to the joint line with a knife and into the semimembranosus bursa with a Cobb elevator. Soft tissue over the proximal lateral tibia is elevated with attention being paid to avoiding the  patellar tendon on the tibial tubercle. The patella is everted, knee flexed 90 degrees and the ACL and PCL are removed. Findings are bone on bone medial and patellofemoral with large osteophyte formation.        The drill is used to create a starting hole in the distal femur and the canal is thoroughly irrigated with sterile saline to remove the fatty contents. The 5 degree Right  valgus alignment guide is placed into the femoral canal and the distal femoral cutting block is pinned to remove 11 mm off the distal femur. Resection is made with an oscillating saw.      The tibia is subluxed forward and the menisci are removed. The extramedullary alignment guide is placed referencing proximally at the medial aspect of the tibial tubercle and distally along the second metatarsal axis and tibial crest. The block is pinned to remove 2mm off the more deficient medial  side. Resection is made with an oscillating saw. Size 3is the most appropriate size for the tibia and the proximal tibia is prepared with the modular drill and keel punch for that size.      The femoral sizing guide is placed and size 4 narrow is most appropriate. Rotation is marked off the epicondylar axis and confirmed by creating a rectangular flexion gap at 90 degrees. The size 4 cutting block is pinned in this rotation and the anterior, posterior and chamfer cuts are made with the oscillating saw. The intercondylar block is then placed and that cut is made.      Trial size 3 tibial component, trial size 4 narrow posterior stabilized femur and a  10  mm posterior stabilized rotating platform insert trial is placed. Full extension is achieved with excellent varus/valgus and anterior/posterior balance throughout full range of motion. The patella is everted and thickness measured to be 24  mm. Free hand resection is taken to 14 mm, a 38 template is placed, lug holes are drilled, trial patella is placed, and it tracks normally. Osteophytes are removed off  the posterior femur with the trial in place. All trials are removed and the cut bone surfaces prepared with pulsatile lavage. Cement is mixed and once ready for implantation, the size 3 tibial implant, size  4 narrow posterior stabilized femoral component, and the size 38 patella are cemented in place and the patella is held with the clamp. The trial insert is placed and the knee held in full extension. All extruded cement is removed and once the cement is hard the permanent 10 mm posterior stabilized rotating platform insert is placed into the tibial tray.      The wound is copiously irrigated with saline solution and the extensor mechanism closed over a hemovac drain with #1 V-loc suture. The tourniquet is released for a total tourniquet time of 41  minutes. Flexion against gravity is 135 degrees and the patella tracks normally. Subcutaneous tissue is closed with 2.0 vicryl and subcuticular with running 4.0 Monocryl. The catheter for the Marcaine pain pump is placed and the pump is initiated. The incision is cleaned and dried and steri-strips and a bulky sterile dressing are applied. The limb is placed into a knee immobilizer and the patient is awakened and transported to recovery in stable condition.      Please note that a surgical assistant was a medical necessity for this procedure in order to perform it in a safe and expeditious manner. Surgical assistant was necessary to retract the ligaments and vital neurovascular structures to prevent injury to them and also necessary for proper positioning of the limb to allow for anatomic placement of the prosthesis.   Gus Rankin Tranquilino Fischler, MD    04/17/2011, 8:31 AM

## 2011-04-17 NOTE — Interval H&P Note (Signed)
History and Physical Interval Note:  04/17/2011 6:47 AM  Shelby Wiley  has presented today for surgery, with the diagnosis of Osteoarthritis right Knee  The various methods of treatment have been discussed with the patient and family. After consideration of risks, benefits and other options for treatment, the patient has consented to  Procedure(s) (LRB): TOTAL KNEE ARTHROPLASTY (Right) as a surgical intervention .  The patients' history has been reviewed, patient examined, no change in status, stable for surgery.  I have reviewed the patients' chart and labs.  Questions were answered to the patient's satisfaction.     Loanne Drilling

## 2011-04-17 NOTE — H&P (View-Only) (Signed)
Shelby Wiley  DOB: January 16, 1935 Single / Language: [place] / Race: White Female  Date of Admission:  04/17/2011  Chief Complaint:  Right Knee Pain  History of Present Illness The patient is a 76 year old female who comes in for a preoperative History and Physical. The patient is scheduled for a right total knee arthroplasty to be performed by Dr. Gus Wiley. Aluisio, MD at [facility] on 04/17/2011. The patient is a 76 year old female who presents with knee complaints. The patient is seen in referral from Dr. Rennis Wiley. The patient reports left knee and right knee symptoms including: pain and instability which began 4 month(s) ago without any known injury.The patient feels that the symptoms are worsening. The patient has the current diagnosis of knee osteoarthritis. Prior to being seen today the patient was previously evaluated in this clinic (after a visit to the emergency room). Previous work-up for this problem has included knee x-rays (on the left) and knee MRI (bilateral). Current treatment includes non-opioid analgesics (Tylenol prn). Shelby Wiley states that both knees bother her tremendously. She's at a stage now where she can't tolerate the pain anymore. She's ready to get them fixed. Both knees hurt equally. The left knee has been hurting for a longer time. She said she had regular x-rays about 1 1/2 years ago and evaluation of the knees. She then had the fall back in the spring of this year and initially hadh plain films done of the left knee which showed bad arthritis but no fractures. She then had an MRI of both knees which showed severe degenerative changes in both knees, left a little worse than the right. At this ponit, the right knee is now more problematic and would like to proceed with knee replacement. They have been treated conservatively in the past for the above stated problem and despite conservative measures, they continue to have progressive pain and severe functional  limitations and dysfunction. They have failed non-operative management. It is felt that they would benefit from undergoing total joint replacement. Risks and benefits of the procedure have been discussed with the patient and they elect to proceed with surgery. There are no active contraindications to surgery such as ongoing infection or rapidly progressive neurological disease.  Allergies CODIENE. Vomiting.  Medication History Hydrochlorothiazide (12.5MG  Tablet, Oral daily) Active. Calcium-Vitamin D (250MG  Capsule, Oral) Active. Acetaminophen (325MG  Capsule, Oral) Active. Aspirin EC (325MG  Tablet DR, Oral) Active. Theratears (0.25% Solution, Ophthalmic) Active.  Problem List/Past Medical Osteoarthritis, knee (715.96) Tear, medial meniscus, knee, current (836.0). 01/10/1991 Hx of transient ischemic attack (TIA) (V12.54) Osteoarthritis High blood pressure  Past Surgical History Cataract Surgery. bilateral Arthroscopy of Knee. left  Family History Heart disease in female family member before age 65 Heart Disease. brother (MI) Cancer. sister (breast) Cerebrovascular Accident. mother  Social History Copy of Drug/Alcohol Rehab (Previously). no Current work status. retired Copywriter, advertising. 2 Exercise. Exercises rarely Illicit drug use. no Living situation. live alone Marital status. single Number of flights of stairs before winded. less than 1 Pain Contract. no Tobacco use. former smoker; smoke(d) 3/4 pack(s) per day; Quit 8-10 years ago Drug/Alcohol Rehab (Currently). no Tobacco / smoke exposure. no  Review of Systems General:Not Present- Chills, Fever, Night Sweats, Fatigue, Weight Gain, Weight Loss and Memory Loss. Skin:Not Present- Hives, Itching, Rash, Eczema and Lesions. HEENT:Not Present- Tinnitus, Headache, Double Vision, Visual Loss, Hearing Loss and Dentures. Respiratory:Not Present- Shortness of breath with exertion, Shortness of breath at rest,  Allergies, Coughing up blood and  Chronic Cough. Cardiovascular:Not Present- Chest Pain, Racing/skipping heartbeats, Difficulty Breathing Lying Down, Murmur, Swelling and Palpitations. Gastrointestinal:Not Present- Bloody Stool, Heartburn, Abdominal Pain, Vomiting, Nausea, Constipation, Diarrhea, Difficulty Swallowing, Jaundice and Loss of appetitie. Female Genitourinary:Not Present- Blood in Urine, Urinary frequency, Weak urinary stream, Discharge, Flank Pain, Incontinence, Painful Urination, Urgency, Urinary Retention and Urinating at Night. Musculoskeletal:Not Present- Muscle Weakness, Muscle Pain, Joint Swelling, Joint Pain, Back Pain, Morning Stiffness and Spasms. Neurological:Not Present- Tremor, Dizziness, Blackout spells, Paralysis, Difficulty with balance and Weakness. Psychiatric:Not Present- Insomnia.  Vitals 03/28/2011 2:43 PM Pulse: 84 (Regular) Resp.: 16 (Unlabored) BP: 134/84 (Sitting, Right Arm, Standard)  Physical Exam The physical exam findings are as follows:  General Mental Status - Alert, cooperative and good historian. General Appearance- pleasant. Not in acute distress. Orientation- Oriented X3. Build & Nutrition- Well nourished and Well developed.   Head and Neck Head- normocephalic, atraumatic . Neck Global Assessment- supple. no bruit auscultated on the right and no bruit auscultated on the left.   Eye Pupil- Bilateral- Regular and Round. Motion- Bilateral- EOMI.   Chest and Lung Exam Auscultation: Breath sounds:- clear at anterior chest wall and - clear at posterior chest wall. Adventitious sounds:- No Adventitious sounds.   Cardiovascular Auscultation:Rhythm- Regular rate and rhythm. Heart Sounds- S1 WNL and S2 WNL. Murmurs & Other Heart Sounds:Auscultation of the heart reveals - No Murmurs.   Abdomen Inspection:Contour- Generalized moderate distention. Palpation/Percussion:Tenderness- Abdomen is non-tender  to palpation. Rigidity (guarding)- Abdomen is soft. Auscultation:Auscultation of the abdomen reveals - Bowel sounds normal.   Female Genitourinary Not done, not pertinent to present illness  Musculoskeletal On exam, well-developed female, alert and oriented, in no apparent distress. Her hips show normal ROM with no discomfort. Her left knee shows no effusion. Range is about 5-120. There is marked crepitus on ROM. There is no instability noted. The right knee shows no effusion, slight varus, 5-125. Tender medial greater than lateral with no instability noted. Pulses, sensation and motor are intact, both lower extremities. Gait pattern is significantly slow, shuffling and antalgic.  RADIOGRAPHS: AP and lateral of both, show advanced end stage arthritis of the left worse than right knee.  Assessment & Plan Osteoarthritis Bilateral Knees  Patient is for a Right Total Knee Arthroplasty by Dr. Lequita Halt.  Patient wants to look into [facility] after the hospital.  PCP - Dr. Mirian Mo, PA-C

## 2011-04-17 NOTE — Progress Notes (Signed)
ANTICOAGULATION CONSULT NOTE - Initial Consult  Pharmacy Consult for Warfarin Indication: VTE prophylaxis  Allergies  Allergen Reactions  . Codeine Sulfate     REACTION: codiene causes GI upset VOMITING    Patient Measurements: 130kg as of 04/06/11  Vital Signs: Temp: 97.5 F (36.4 C) (04/15 1050) Temp src: Oral (04/15 0520) BP: 141/75 mmHg (04/15 1050) Pulse Rate: 80  (04/15 1050)  Labs: 04/06/11:  PT/INR = 13.1/0.97, PTT = 35 The CrCl is unknown because both a height and weight (above a minimum accepted value) are required for this calculation.  Medical History: Past Medical History  Diagnosis Date  . Arthritis     PAIN AND OA BOTH KNEES  . Hypertension   . Stroke     POSS TIA IN 2012   . Shortness of breath     PT RELATES TO HER WEIGHT-OUT OF SHAPE  . Sleep apnea     STOP BANG SCORE 5    Medications:  Scheduled:    . acetaminophen  1,000 mg Intravenous Q6H  .  ceFAZolin (ANCEF) IV  1 g Intravenous Q6H  .  ceFAZolin (ANCEF) IV  2 g Intravenous Once  . docusate sodium  100 mg Oral BID  . enoxaparin  30 mg Subcutaneous Q12H  . hydrochlorothiazide  12.5 mg Oral QHS  . morphine   Intravenous Q4H    Assessment:  76 yo WF s/p right TKA on 4/15  Begin warfarin therapy for VTE prophylaxis  Warfarin dosing points = 3, begin with 5mg  per protocol   Goal of Therapy:  INR 2-3   Plan:   Warfarin 5mg  po x 1 today  Lovenox 30mg  sq q12h, dc when INR >/= 1.8 per MD  Daily PT/INR  Education book/video ordered   Loralee Pacas, PharmD, BCPS Pager: [phone] 04/17/2011,11:39 AM

## 2011-04-18 LAB — BASIC METABOLIC PANEL
BUN: 8 mg/dL (ref 6–23)
Chloride: 101 mEq/L (ref 96–112)
Creatinine, Ser: 0.71 mg/dL (ref 0.50–1.10)
Glucose, Bld: 108 mg/dL — ABNORMAL HIGH (ref 70–99)
Potassium: 3.4 mEq/L — ABNORMAL LOW (ref 3.5–5.1)

## 2011-04-18 LAB — CBC
HCT: 37.4 % (ref 36.0–46.0)
Hemoglobin: 12 g/dL (ref 12.0–15.0)
MCHC: 32.1 g/dL (ref 30.0–36.0)
MCV: 96.6 fL (ref 78.0–100.0)
WBC: 9.1 10*3/uL (ref 4.0–10.5)

## 2011-04-18 MED ORDER — SODIUM CHLORIDE 0.9 % IV SOLN
INTRAVENOUS | Status: DC
  Administered 2011-04-18: 10:00:00 via INTRAVENOUS

## 2011-04-18 MED ORDER — MORPHINE SULFATE 2 MG/ML IJ SOLN: 1.0000 mg | INTRAMUSCULAR | Status: DC | PRN

## 2011-04-18 MED ORDER — WARFARIN SODIUM 5 MG PO TABS
5.0000 mg | ORAL_TABLET | Freq: Once | ORAL | Status: AC
  Administered 2011-04-18: 5 mg via ORAL
  Filled 2011-04-18: qty 1

## 2011-04-18 MED ORDER — POTASSIUM CHLORIDE CRYS ER 20 MEQ PO TBCR
40.0000 meq | EXTENDED_RELEASE_TABLET | Freq: Every day | ORAL | Status: AC
  Administered 2011-04-18 – 2011-04-19 (×2): 40 meq via ORAL
  Filled 2011-04-18 (×2): qty 2

## 2011-04-18 NOTE — Evaluation (Signed)
Physical Therapy Evaluation Patient Details Name: Shelby Wiley MRN: [id number] DOB: Jul 25, 1935 Today's Date: 04/18/2011  Problem List:  Patient Active Problem List  Diagnoses  . WART, VIRAL  . OBESITY, MORBID  . Vitreous degeneration  . EYE FLOATERS, LEFT  . HYPERTENSION, BORDERLINE  . TIA  . HEMORRHOIDS, INTERNAL  . EXTERNAL HEMORRHOIDS  . INSUFFICIENCY, VENOUS NOS  . DIVERTICULOSIS, COLON  . OSTEOARTHRITIS  . SHOULDER PAIN, RIGHT  . KERATOSIS PILARIS  . FATIGUE  . HYPERGLYCEMIA, BORDERLINE  . OA (osteoarthritis) of knee    Past Medical History:  Past Medical History  Diagnosis Date  . Arthritis     PAIN AND OA BOTH KNEES  . Hypertension   . Stroke     POSS TIA IN 2012   . Shortness of breath     PT RELATES TO HER WEIGHT-OUT OF SHAPE  . Sleep apnea     STOP BANG SCORE 5   Past Surgical History:  Past Surgical History  Procedure Date  . Eye surgery     BILATERAL CATARACT EXTRACTION  . Left knee arthroscopy     PT Assessment/Plan/Recommendation PT Assessment Clinical Impression Statement: pt s/p RTKA, had low BP 94/58  HR 84 sats 90% after mobilizing to chair.  Pt will benefit from PT to improve in mobility, ROM, sttrength and functional mobility to DC to SNF. PT Recommendation/Assessment: Patient will need skilled PT in the acute care venue PT Problem List: Decreased strength;Decreased range of motion;Decreased activity tolerance;Decreased mobility;Decreased knowledge of use of DME;Decreased knowledge of precautions;Cardiopulmonary status limiting activity;Pain PT Therapy Diagnosis : Difficulty walking;Acute pain PT Plan PT Frequency: 7X/week PT Treatment/Interventions: DME instruction;Gait training;Functional mobility training;Therapeutic activities;Therapeutic exercise;Patient/family education PT Recommendation Recommendations for Other Services: OT consult Follow Up Recommendations: Skilled nursing facility Equipment Recommended: None recommended by  PT PT Goals  Acute Rehab PT Goals PT Goal Formulation: With patient Time For Goal Achievement: 7 days Pt will go Supine/Side to Sit: with min assist;with rail;with HOB 0 degrees PT Goal: Supine/Side to Sit - Progress: Goal set today Pt will go Sit to Supine/Side: with min assist;with HOB 0 degrees PT Goal: Sit to Supine/Side - Progress: Goal set today Pt will go Sit to Stand: with min assist PT Goal: Sit to Stand - Progress: Goal set today Pt will go Stand to Sit: with supervision PT Goal: Stand to Sit - Progress: Goal set today Pt will Transfer Bed to Chair/Chair to Bed: with min assist PT Transfer Goal: Bed to Chair/Chair to Bed - Progress: Goal set today Pt will Ambulate: 16 - 50 feet;with min assist;with rolling walker PT Goal: Ambulate - Progress: Goal set today Pt will Perform Home Exercise Program: with supervision, verbal cues required/provided PT Goal: Perform Home Exercise Program - Progress: Goal set today  PT Evaluation Precautions/Restrictions  Precautions Precautions: Knee Required Braces or Orthoses: Knee Immobilizer - Right Knee Immobilizer - Right: Discontinue once straight leg raise with < 10 degree lag Prior Functioning  Home Living Lives With: Alone Type of Home: House Home Layout: One level Home Adaptive Equipment: Walker - rolling Prior Function Level of Independence: Independent Cognition Cognition Arousal/Alertness: Awake/alert Overall Cognitive Status: Appears within functional limits for tasks assessed Orientation Level: Oriented X4 Sensation/Coordination Sensation Light Touch: Appears Intact Extremity Assessment RLE Assessment RLE Assessment: Exceptions to [facility] RLE AROM (degrees) RLE Overall AROM Comments: pt tolerated knee flexion to 40. RLE Strength RLE Overall Strength Comments: unable to perform SLR. ankle WFL LLE Assessment LLE Assessment: Within Functional Limits (  pt w/ DJD KNEE) Mobility (including Balance) Bed Mobility Bed  Mobility: Yes Supine to Sit: 1: +2 Total assist;HOB elevated (Comment degrees);With rails Supine to Sit Details (indicate cue type and reason): pt takes extra time and breaks. HOB at 50. pt sates back trouble, pt=40% Transfers Transfers: Yes Sit to Stand: 1: +2 Total assist;From elevated surface Sit to Stand Details (indicate cue type and reason): bed raised to nearly standing, vc to push from bed rail, pt=50% Stand to Sit: 1: +2 Total assist Stand to Sit Details: vc to step RLE forward/ reach to recliner, pt = 60% Stand Pivot Transfers: 1: +2 Total assist Stand Pivot Transfer Details (indicate cue type and reason): vc to try to place more weight onto RLE, pt scoooted on L foot to turn, chair brought up closer as pt. c/o feeling weak. pt= 60% Ambulation/Gait Ambulation/Gait: No    Exercise  Total Joint Exercises Ankle Circles/Pumps: AROM;Right;10 reps;Supine Quad Sets: AROM;Right;10 reps;Supine Heel Slides: AAROM;Right;10 reps;Supine Straight Leg Raises: AAROM;Right;10 reps;Supine End of Session PT - End of Session Equipment Utilized During Treatment: Right knee immobilizer Activity Tolerance: Patient limited by fatigue;Patient limited by pain;Treatment limited secondary to medical complications (Comment) (RN aware of BP94/58) Patient left: in chair;with call bell in reach Nurse Communication: Mobility status for transfers General Behavior During Session: [facility] for tasks performed Cognition: [facility] for tasks performed  Rada Hay 04/18/2011, 5:25 PM  [phone]

## 2011-04-18 NOTE — Progress Notes (Signed)
Subjective: 1 Day Post-Op Procedure(s) (LRB): TOTAL KNEE ARTHROPLASTY (Right) Patient reports pain as mild and moderate.   Patient seen in rounds with Dr. Lequita Halt. Patient has complaints of rough day yesterday, little better today. We will start therapy today. Plan is to go [facility] after hospital stay.  Objective: Vital signs in last 24 hours: Temp:  [97 F (36.1 C)-98.6 F (37 C)] 98.5 F (36.9 C) (04/16 0520) Pulse Rate:  [76-91] 91  (04/16 0520) Resp:  [12-24] 18  (04/16 0520) BP: (99-144)/(61-107) 116/69 mmHg (04/16 0520) SpO2:  [89 %-100 %] 98 % (04/16 0520) Weight:  [130.772 kg (288 lb 4.8 oz)] 130.772 kg (288 lb 4.8 oz) (04/15 1200)  Intake/Output from previous day:  Intake/Output Summary (Last 24 hours) at 04/18/11 0825 Last data filed at 04/18/11 0654  Gross per 24 hour  Intake   2450 ml  Output   1660 ml  Net    790 ml    Intake/Output this shift:    Labs:  Basename 04/18/11 0420  HGB 12.0    Basename 04/18/11 0420  WBC 9.1  RBC 3.87  HCT 37.4  PLT 167    Basename 04/18/11 0420  NA 137  K 3.4*  CL 101  CO2 31  BUN 8  CREATININE 0.71  GLUCOSE 108*  CALCIUM 8.3*    Basename 04/18/11 0420  LABPT --  INR 1.02    Exam - Neurovascular intact Sensation intact distally Dressing - clean, dry, no drainage Motor function intact - moving foot and toes well on exam.  Hemovac pulled without difficulty.  Past Medical History  Diagnosis Date  . Arthritis     PAIN AND OA BOTH KNEES  . Hypertension   . Stroke     POSS TIA IN 2012   . Shortness of breath     PT RELATES TO HER WEIGHT-OUT OF SHAPE  . Sleep apnea     STOP BANG SCORE 5    Assessment/Plan: 1 Day Post-Op Procedure(s) (LRB): TOTAL KNEE ARTHROPLASTY (Right) Principal Problem:  *OA (osteoarthritis) of knee   Advance diet Up with therapy Continue foley due to strict I&O and urinary output monitoring Discharge to SNF - [facility]  DVT Prophylaxis - Lovenox and  Coumadin Weight-Bearing as tolerated to right leg Keep foley until tomorrow. No vaccines. D/C PCA Morphine, Change to IV push D/C O2 and Pulse OX and try on Room Air  Khyle Goodell 04/18/2011, 8:25 AM

## 2011-04-18 NOTE — Progress Notes (Signed)
Clinical Social Work Department BRIEF PSYCHOSOCIAL ASSESSMENT 04/18/2011  Patient:  Shelby Wiley, Shelby Wiley     Account Number:  [id number]     Admit date:  04/17/2011  Clinical Social Worker:  Skip Mayer  Date/Time:  04/18/2011 10:00 AM  Referred by:  Physician  Date Referred:  04/18/2011 Referred for  SNF Placement   Other Referral:   Interview type:  Patient Other interview type:    PSYCHOSOCIAL DATA Living Status:  ALONE Admitted from facility:   Level of care:   Primary support name:  Darrell Primary support relationship to patient:  CHILD, ADULT Degree of support available:    CURRENT CONCERNS Current Concerns  Post-Acute Placement   Other Concerns:    SOCIAL WORK ASSESSMENT / PLAN CSW received consult for SNF. Met with pt who reports pre-reg with [facility]. CSW confirmed pt has bed when stable with [facility] [place]. CSW to complete FL2 and place on chart for MD signature. Will follow for SNF.   Assessment/plan status:  Information/Referral to [facility] Other assessment/ plan:   Information/referral to community resources:   SNF    PATIENT'S/FAMILY'S RESPONSE TO PLAN OF CARE: Pt report understanding of need for rehab prior to return home. Pt verbalized understanding of d/c plan and appreciation for CSW visit and assist.   Dellie Burns, MSW, LCSWA [phone] (covering)

## 2011-04-18 NOTE — Progress Notes (Addendum)
Clinical Social Work Department CLINICAL SOCIAL WORK PLACEMENT NOTE 04/18/2011  Patient:  Shelby Wiley, Shelby Wiley  Account Number:  [id number] Admit date:  04/17/2011  Clinical Social Worker:  Skip Mayer  Date/time:  04/18/2011 10:00 AM  Clinical Social Work is seeking post-discharge placement for this patient at the following level of care:   SKILLED NURSING   (*CSW will update this form in Epic as items are completed)   04/18/2011  Patient/family provided with [facility] Department of Clinical Social Work's list of facilities offering this level of care within the geographic area requested by the patient (or if unable, by the patient's family).  04/18/2011  Patient/family informed of their freedom to choose among providers that offer the needed level of care, that participate in Medicare, Medicaid or managed care program needed by the patient, have an available bed and are willing to accept the patient.  04/18/2011  Patient/family informed of [facility]' ownership interest in [facility], as well as of the fact that they are under no obligation to receive care at this facility.  PASARR submitted to EDS on 04/18/2011 PASARR number received from [employer] on   FL2 transmitted to all facilities in geographic area requested by pt/family on  04/18/2011 FL2 transmitted to all facilities within larger geographic area on   Patient informed that his/her managed care company has contracts with or will negotiate with  certain facilities, including the following:     Patient/family informed of bed offers received: 04/18/2011  Patient chooses bed at [facility] Physician recommends and patient chooses bed at  SNF  Patient to be transferred to  on  04/20/11 Patient to be transferred to facility by [facility]  The following physician request were entered in Epic:   Additional Comments:  Dellie Burns, MSW, LCSWA [phone] (covering)

## 2011-04-18 NOTE — Progress Notes (Signed)
Physical Therapy Treatment Patient Details Name: Shelby Wiley MRN: [id number] DOB: February 26, 1935 Today's Date: 04/18/2011 [id number] PT Assessment/Plan  PT - Assessment/Plan Comments on Treatment Session: pt continues to have difficulty w/ mobility. will need SNF PT Plan: Discharge plan remains appropriate PT Frequency: 7X/week Recommendations for Other Services: OT consult Follow Up Recommendations: Skilled nursing facility Equipment Recommended: None recommended by PT PT Goals  Acute Rehab PT Goals PT Goal Formulation: With patient Time For Goal Achievement: 7 days Pt will go Supine/Side to Sit: with min assist;with rail;with HOB 0 degrees PT Goal: Supine/Side to Sit - Progress: Goal set today Pt will go Sit to Supine/Side: with min assist PT Goal: Sit to Supine/Side - Progress: Progressing toward goal Pt will go Sit to Stand: with min assist PT Goal: Sit to Stand - Progress: Progressing toward goal Pt will go Stand to Sit: with min assist PT Goal: Stand to Sit - Progress: Progressing toward goal Pt will Transfer Bed to Chair/Chair to Bed: with min assist PT Transfer Goal: Bed to Chair/Chair to Bed - Progress: Progressing toward goal Pt will Ambulate: 16 - 50 feet;with min assist;with rolling walker PT Goal: Ambulate - Progress: Goal set today Pt will Perform Home Exercise Program: with supervision, verbal cues required/provided PT Goal: Perform Home Exercise Program - Progress: Goal set today  PT Treatment Precautions/Restrictions  Precautions Precautions: Knee Required Braces or Orthoses: Knee Immobilizer - Right Knee Immobilizer - Right: Discontinue once straight leg raise with < 10 degree lag Restrictions Weight Bearing Restrictions: No Mobility (including Balance) Bed Mobility Bed Mobility: Yes Sit to Supine: 1: +2 Total assist;HOB elevated (comment degrees) Sit to Supine - Details (indicate cue type and reason): HOB at 40. pt required assist for both LEs onto  bed Transfers Transfers: Yes Sit to Stand: 1: +2 Total assist;From chair/3-in-1;With upper extremity assist Sit to Stand Details (indicate cue type and reason): pt takes extra time to get momentum, to stand Stand to Sit: 1: +2 Total assist;To bed;To elevated surface Stand to Sit Details: bed brought closer by 3rd person as pt had much dififculty stepping backward.  Stand Pivot Transfers: 1: +2 Total assist Stand Pivot Transfer Details (indicate cue type and reason): 3rd person for safety as pt had much difficulty w/ transfer to recliner earlier. Ambulation/Gait Ambulation/Gait: No    Exercise   End of Session PT - End of Session Equipment Utilized During Treatment: Right knee immobilizer Activity Tolerance: Patient limited by fatigue Patient left: in bed;with call bell in reach Nurse Communication: Mobility status for transfers General Behavior During Session: [facility] for tasks performed Cognition: [facility] for tasks performed  Rada Hay 04/18/2011, 5:32 PM [phone]

## 2011-04-18 NOTE — Progress Notes (Signed)
ANTICOAGULATION CONSULT NOTE - Follow Up Consult  Pharmacy Consult for Coumadin Indication: VTE prophylaxis  Allergies  Allergen Reactions  . Codeine Sulfate     REACTION: codiene causes GI upset VOMITING    Patient Measurements: Height: 5\' 5"  (165.1 cm) Weight: 288 lb 4.8 oz (130.772 kg) IBW/kg (Calculated) : 57    Vital Signs: Temp: 98.5 F (36.9 C) (04/16 0520) Temp src: Oral (04/16 0520) BP: 116/69 mmHg (04/16 0520) Pulse Rate: 91  (04/16 0520)  Labs:  Basename 04/18/11 0420  HGB 12.0  HCT 37.4  PLT 167  APTT --  LABPROT 13.6  INR 1.02  HEPARINUNFRC --  CREATININE 0.71  CKTOTAL --  CKMB --  TROPONINI --   Estimated Creatinine Clearance: 85.5 ml/min (by C-G formula based on Cr of 0.71).   Medications:  Prescriptions prior to admission  Medication Sig Dispense Refill  . Ascorbic Acid (VITAMIN C PO) Take 1 tablet by mouth daily as needed.  sore throat      . aspirin 325 MG EC tablet Take 325 mg by mouth every other day.      . Calcium Citrate-Vitamin D (EQL CALCIUM CITRATE/VITAMIN D) 315-250 MG-UNIT TABS Take 1 tablet by mouth every other day.      . hydrochlorothiazide (MICROZIDE) 12.5 MG capsule Take 12.5 mg by mouth at bedtime.      Marland Kitchen acetaminophen (TYLENOL) 500 MG tablet Take 500 mg by mouth every 6 (six) hours as needed. Pain       . Carboxymethylcellulose Sodium 0.25 % SOLN Place 3 drops into both eyes 2 (two) times daily as needed. Dry eyes        Scheduled:    . acetaminophen  1,000 mg Intravenous Q6H  .  ceFAZolin (ANCEF) IV  1 g Intravenous Q6H  . docusate sodium  100 mg Oral BID  . enoxaparin  30 mg Subcutaneous Q12H  . hydrochlorothiazide  12.5 mg Oral QHS  . patient's guide to using coumadin book   Does not apply Once  . potassium chloride  40 mEq Oral Daily  . warfarin  5 mg Oral ONCE-1800  . warfarin   Does not apply Once  . Warfarin - Pharmacist Dosing Inpatient   Does not apply q1800  . DISCONTD: morphine   Intravenous Q4H     Assessment:  83 YOF s/p R TKA 4/15  Started warfarin tx last night for post op VTE ppx  INR up only slightly as expected after only one dose last night (5mg )  Lovenox 30mg  sq q12h started this morning until INR >= 1.8  No bleeding reported  Goal of Therapy:  INR 2-3   Plan:  Repeat 5mg  today Daily INR Education before D/C  Gwen Her PharmD  [phone] 04/18/2011 10:08 AM

## 2011-04-19 LAB — BASIC METABOLIC PANEL
CO2: 30 mEq/L (ref 19–32)
Calcium: 8.4 mg/dL (ref 8.4–10.5)
Creatinine, Ser: 0.74 mg/dL (ref 0.50–1.10)
GFR calc non Af Amer: 82 mL/min — ABNORMAL LOW (ref >90)
Sodium: 135 mEq/L (ref 135–145)

## 2011-04-19 LAB — CBC
MCH: 31.9 pg (ref 26.0–34.0)
MCHC: 32.9 g/dL (ref 30.0–36.0)
MCV: 96.8 fL (ref 78.0–100.0)
Platelets: 154 10*3/uL (ref 150–400)
RBC: 3.42 MIL/uL — ABNORMAL LOW (ref 3.87–5.11)

## 2011-04-19 LAB — PROTIME-INR
INR: 1.21 (ref 0.00–1.49)
Prothrombin Time: 15.6 seconds — ABNORMAL HIGH (ref 11.6–15.2)

## 2011-04-19 MED ORDER — WARFARIN SODIUM 6 MG PO TABS
6.0000 mg | ORAL_TABLET | Freq: Once | ORAL | Status: AC
  Administered 2011-04-19: 6 mg via ORAL
  Filled 2011-04-19: qty 1

## 2011-04-19 NOTE — Progress Notes (Signed)
Subjective: 2 Days Post-Op Procedure(s) (LRB): TOTAL KNEE ARTHROPLASTY (Right) Patient reports pain as mild.   Patient seen in rounds with Dr. Lequita Halt. Patient some nausea but doing fairly well otherwise.  Will get up again with therapy.  Plan is for rehab.  Objective: Vital signs in last 24 hours: Temp:  [98.5 F (36.9 C)-99.9 F (37.7 C)] 99.6 F (37.6 C) (04/17 0557) Pulse Rate:  [83-98] 98  (04/17 0557) Resp:  [16] 16  (04/17 0557) BP: (104-147)/(63-78) 120/67 mmHg (04/17 0557) SpO2:  [93 %-100 %] 93 % (04/17 0557)  Intake/Output from previous day:  Intake/Output Summary (Last 24 hours) at 04/19/11 1026 Last data filed at 04/19/11 0953  Gross per 24 hour  Intake   1133 ml  Output   1250 ml  Net   -117 ml    Intake/Output this shift: Total I/O In: 360 [P.O.:360] Out: 100 [Urine:100]  Labs:  [facility] 04/19/11 0405 04/18/11 0420  HGB 10.9* 12.0    Basename 04/19/11 0405 04/18/11 0420  WBC 10.7* 9.1  RBC 3.42* 3.87  HCT 33.1* 37.4  PLT 154 167    Basename 04/19/11 0405 04/18/11 0420  NA 135 137  K 4.0 3.4*  CL 99 101  CO2 30 31  BUN 7 8  CREATININE 0.74 0.71  GLUCOSE 113* 108*  CALCIUM 8.4 8.3*    Basename 04/19/11 0405 04/18/11 0420  LABPT -- --  INR 1.21 1.02    Exam - Neurovascular intact Sensation intact distally Dressing/Incision - clean, dry, no drainage, healing Motor function intact - moving foot and toes well on exam.   Past Medical History  Diagnosis Date  . Arthritis     PAIN AND OA BOTH KNEES  . Hypertension   . Stroke     POSS TIA IN 2012   . Shortness of breath     PT RELATES TO HER WEIGHT-OUT OF SHAPE  . Sleep apnea     STOP BANG SCORE 5    Assessment/Plan: 2 Days Post-Op Procedure(s) (LRB): TOTAL KNEE ARTHROPLASTY (Right) Principal Problem:  *OA (osteoarthritis) of knee   Up with therapy Discharge to SNF  DVT Prophylaxis - Lovenox and Coumadin Weight-Bearing as tolerated to right leg  Roxanne Panek,  Ayano Douthitt 04/19/2011, 10:26 AM

## 2011-04-19 NOTE — Progress Notes (Signed)
Physical Therapy Treatment Patient Details Name: Shelby Wiley MRN: [id number] DOB: [birth year] Today's Date: 04/19/2011 4098-1191 PT Assessment/Plan  PT - Assessment/Plan Comments on Treatment Session: pt c/o feeling very weak when getting upright. requires extensive assit for bed mobility and transfers. has not been able to ambulate at this point. BP was 138/60 after sitting up. Pt required extensive amount of time for shtis treatment session. PT Plan: Discharge plan remains appropriate Equipment Recommended: Defer to next venue PT Goals  Acute Rehab PT Goals Pt will go Supine/Side to Sit: with min assist PT Goal: Supine/Side to Sit - Progress: Progressing toward goal Pt will go Sit to Supine/Side: with min assist PT Goal: Sit to Supine/Side - Progress: Progressing toward goal Pt will go Sit to Stand: with min assist PT Goal: Sit to Stand - Progress: Progressing toward goal Pt will go Stand to Sit: with min assist PT Goal: Stand to Sit - Progress: Progressing toward goal Pt will Transfer Bed to Chair/Chair to Bed: with min assist PT Transfer Goal: Bed to Chair/Chair to Bed - Progress: Progressing toward goal Pt will Ambulate: 16 - 50 feet;with rolling walker PT Goal: Ambulate - Progress: Progressing toward goal Pt will Perform Home Exercise Program: with supervision, verbal cues required/provided PT Goal: Perform Home Exercise Program - Progress: Progressing toward goal  PT Treatment Precautions/Restrictions  Precautions Precautions: Knee Required Braces or Orthoses: Knee Immobilizer - Right Knee Immobilizer - Right: Discontinue once straight leg raise with < 10 degree lag Restrictions Weight Bearing Restrictions: No Mobility (including Balance) Bed Mobility Supine to Sit: 1: +2 Total assist;Patient percentage (comment);With rails;HOB elevated (Comment degrees) Supine to Sit Details (indicate cue type and reason): Pt requires HOB at 90 degrees and requires increased time. Pt  does not always respond to safety cues. Sit to Supine: 1: +2 Total assist;Patient percentage (comment);HOB flat Sit to Supine - Details (indicate cue type and reason): HOB at 40. pt required assist for both LEs onto bed, pt =20% Transfers Sit to Stand: 1: +2 Total assist;Patient percentage (comment);From chair/3-in-1;With upper extremity assist;With armrests;From bed Sit to Stand Details (indicate cue type and reason): Pt takes increased time and uses momentum to hoist herself  Stand to Sit: 1: +2 Total assist;Patient percentage (comment);With upper extremity assist;With armrests;To bed;To chair/3-in-1 Stand to Sit Details: cues for hand placement and to control descent. Stand Pivot Transfers: 1: +2 Total assist Stand Pivot Transfer Details (indicate cue type and reason): pt scooted on L foot to turn to get to [facility], then +3 to get form BSC to bed. pt became very weak.    Exercise  Total Joint Exercises Ankle Circles/Pumps: AROM;Right;10 reps;Supine Quad Sets: AROM;Right;10 reps;Supine Heel Slides: AAROM;Right;10 reps;Supine Straight Leg Raises: AAROM;Right;10 reps;Supine End of Session PT - End of Session Equipment Utilized During Treatment: Right knee immobilizer Activity Tolerance: Patient limited by fatigue Patient left: in bed;with call bell in reach Nurse Communication: Mobility status for transfers General Behavior During Session: [facility] for tasks performed Cognition: [facility] for tasks performed  Rada Hay 04/19/2011, 4:51 PM

## 2011-04-19 NOTE — Progress Notes (Signed)
ANTICOAGULATION CONSULT NOTE - Follow Up Consult  Pharmacy Consult for Coumadin Indication: VTE prophylaxis  Allergies  Allergen Reactions  . Codeine Sulfate     REACTION: codiene causes GI upset VOMITING    Patient Measurements: Height: 5\' 5"  (165.1 cm) Weight: 288 lb 4.8 oz (130.772 kg) IBW/kg (Calculated) : 57    Vital Signs: Temp: 99.6 F (37.6 C) (04/17 0557) Temp src: Oral (04/17 0557) BP: 120/67 mmHg (04/17 0557) Pulse Rate: 98  (04/17 0557)  Labs:  Basename 04/19/11 0405 04/18/11 0420  HGB 10.9* 12.0  HCT 33.1* 37.4  PLT 154 167  APTT -- --  LABPROT 15.6* 13.6  INR 1.21 1.02  HEPARINUNFRC -- --  CREATININE 0.74 0.71  CKTOTAL -- --  CKMB -- --  TROPONINI -- --   Estimated Creatinine Clearance: 85.5 ml/min (by C-G formula based on Cr of 0.74).   Medications:  Prescriptions prior to admission  Medication Sig Dispense Refill  . Ascorbic Acid (VITAMIN C PO) Take 1 tablet by mouth daily as needed.  sore throat      . aspirin 325 MG EC tablet Take 325 mg by mouth every other day.      . Calcium Citrate-Vitamin D (EQL CALCIUM CITRATE/VITAMIN D) 315-250 MG-UNIT TABS Take 1 tablet by mouth every other day.      . hydrochlorothiazide (MICROZIDE) 12.5 MG capsule Take 12.5 mg by mouth at bedtime.      Marland Kitchen acetaminophen (TYLENOL) 500 MG tablet Take 500 mg by mouth every 6 (six) hours as needed. Pain       . Carboxymethylcellulose Sodium 0.25 % SOLN Place 3 drops into both eyes 2 (two) times daily as needed. Dry eyes        Scheduled:     . docusate sodium  100 mg Oral BID  . enoxaparin  30 mg Subcutaneous Q12H  . hydrochlorothiazide  12.5 mg Oral QHS  . potassium chloride  40 mEq Oral Daily  . warfarin  5 mg Oral ONCE-1800  . Warfarin - Pharmacist Dosing Inpatient   Does not apply q1800    Assessment:  33 YOF s/p R TKA 4/15, coumadin and Lovenox 30mg  sq q12h started for VTE ppx.    INR up to 1.21 s/p 2 doses of Coumadin 5 mg. No bleeding reported.   Hgb  10.9 < 12.  No bleeding reported.  Plan d/c to SNF when stable.  Goal of Therapy:  INR 2-3   Plan:   Coumadin 6mg  today, continue LMWH until INR >= 1.8  Daily PT/INR  Will provide Coumadin education to patient today.    Geoffry Paradise, PharmD.   Pager:  [phone] 12:37 PM

## 2011-04-19 NOTE — Evaluation (Signed)
Occupational Therapy Evaluation Patient Details Name: Shelby Wiley MRN: [id number] DOB: 1935-03-28 Today's Date: 04/19/2011  Problem List:  Patient Active Problem List  Diagnoses  . WART, VIRAL  . OBESITY, MORBID  . Vitreous degeneration  . EYE FLOATERS, LEFT  . HYPERTENSION, BORDERLINE  . TIA  . HEMORRHOIDS, INTERNAL  . EXTERNAL HEMORRHOIDS  . INSUFFICIENCY, VENOUS NOS  . DIVERTICULOSIS, COLON  . OSTEOARTHRITIS  . SHOULDER PAIN, RIGHT  . KERATOSIS PILARIS  . FATIGUE  . HYPERGLYCEMIA, BORDERLINE  . OA (osteoarthritis) of knee    Past Medical History:  Past Medical History  Diagnosis Date  . Arthritis     PAIN AND OA BOTH KNEES  . Hypertension   . Stroke     POSS TIA IN 2012   . Shortness of breath     PT RELATES TO HER WEIGHT-OUT OF SHAPE  . Sleep apnea     STOP BANG SCORE 5   Past Surgical History:  Past Surgical History  Procedure Date  . Eye surgery     BILATERAL CATARACT EXTRACTION  . Left knee arthroscopy     OT Assessment/Plan/Recommendation OT Assessment Clinical Impression Statement: Pt is a 76 yo female who presents with RTKR POD2. Skilled OT recommended to maximize I w/BADLs to mod-max A level in prep for d/c to next venue of care. OT Recommendation/Assessment: Patient will need skilled OT in the acute care venue OT Problem List: Decreased activity tolerance;Decreased safety awareness;Decreased knowledge of use of DME or AE;Impaired balance (sitting and/or standing) Barriers to Discharge: Inaccessible home environment;Decreased caregiver support OT Therapy Diagnosis : Generalized weakness OT Plan OT Frequency: Min 1X/week OT Treatment/Interventions: Self-care/ADL training;Therapeutic activities;DME and/or AE instruction;Patient/family education OT Recommendation Follow Up Recommendations: Skilled nursing facility Equipment Recommended: Defer to next venue Individuals Consulted Consulted and Agree with Results and Recommendations: Patient OT  Goals Acute Rehab OT Goals OT Goal Formulation: With patient ADL Goals Pt Will Perform Grooming: with set-up;Sitting, edge of bed;with supervision;Unsupported (X 3 tasks to improve activity tolerance.) ADL Goal: Grooming - Progress: Goal set today Pt Will Transfer to Toilet: with mod assist;Extra wide 3-in-1;Stand pivot transfer ADL Goal: Toilet Transfer - Progress: Goal set today Pt Will Perform Toileting - Clothing Manipulation: with max assist;Standing ADL Goal: Toileting - Clothing Manipulation - Progress: Goal set today Pt Will Perform Toileting - Hygiene: with max assist;Sit to stand from 3-in-1/toilet ADL Goal: Toileting - Hygiene - Progress: Goal set today  OT Evaluation Precautions/Restrictions  Precautions Precautions: Knee Required Braces or Orthoses: Knee Immobilizer - Right Knee Immobilizer - Right: Discontinue once straight leg raise with < 10 degree lag Restrictions Weight Bearing Restrictions: No Prior Functioning Home Living Lives With: Alone Available Help at Discharge: Skilled Nursing Facility Type of Home: House Home Layout: One level Prior Function Level of Independence: Independent with assistive device(s) Comments: Pt plans to d/c to st snf.  ADL ADL Grooming: Simulated;Set up Where Assessed - Grooming: Sitting, bed;Unsupported Upper Body Bathing: Simulated;Moderate assistance Where Assessed - Upper Body Bathing: Sitting, bed;Unsupported Lower Body Bathing: Simulated;+2 Total assistance;Comment for patient % (0%) Where Assessed - Lower Body Bathing: Sit to stand from bed Upper Body Dressing: Simulated;Minimal assistance Where Assessed - Upper Body Dressing: Sitting, bed;Unsupported Lower Body Dressing: Simulated;+2 Total assistance;Comment for patient % (0%) Where Assessed - Lower Body Dressing: Sit to stand from bed Toilet Transfer: Performed;+2 Total assistance;Comment for patient % (Pt 40%) Toilet Transfer Method: Stand pivot Education administrator: Extra wide bedside commode Toileting - Clothing Manipulation:  Performed;+2 Total assistance;Comment for patient % (0%) Where Assessed - Toileting Clothing Manipulation: Sit to stand from 3-in-1 or toilet Toileting - Hygiene: Simulated;+2 Total assistance;Comment for patient % (0%) Where Assessed - Toileting Hygiene: Sit to stand from 3-in-1 or toilet Tub/Shower Transfer: Not assessed Tub/Shower Transfer Method: Not assessed Equipment Used: Rolling walker (wide BSC) ADL Comments: Pt fatigued very quickly. Limited by dizziness and weakness. BP WNL. Vision/Perception    Cognition Cognition Arousal/Alertness: Awake/alert Overall Cognitive Status: Appears within functional limits for tasks assessed Orientation Level: Oriented X4 Sensation/Coordination   Extremity Assessment RUE Assessment RUE Assessment: Within Functional Limits LUE Assessment LUE Assessment: Within Functional Limits Mobility  Bed Mobility Supine to Sit: 1: +2 Total assist;Patient percentage (comment);With rails;HOB elevated (Comment degrees) (Pt 30%) Supine to Sit Details (indicate cue type and reason): Pt requires HOB at 90 degrees and requires increased time. Pt does not always respond to safety cues. Sit to Supine: 1: +2 Total assist;Patient percentage (comment);HOB flat (Pt 30%) Transfers Sit to Stand: 1: +2 Total assist;Patient percentage (comment);From chair/3-in-1;With upper extremity assist;With armrests;From bed Sit to Stand Details (indicate cue type and reason): Pt takes increased time and uses momentum to hoist herself up. Stand to Sit: 1: +2 Total assist;Patient percentage (comment);With upper extremity assist;With armrests;To bed;To chair/3-in-1 (Pt 40%) Stand to Sit Details: cues for hand placement and to control descent. Exercises   End of Session OT - End of Session Activity Tolerance: Patient limited by fatigue Patient left: in bed;with call bell in reach General Behavior During  Session: [facility] for tasks performed Cognition: [facility] for tasks performed   Oswell Say A, OTR/L [phone] 04/19/2011, 3:09 PM

## 2011-04-19 NOTE — Progress Notes (Signed)
Physical Therapy Treatment Patient Details Name: Shelby Wiley MRN: [id number] DOB: 21-May-1935 Today's Date: 04/19/2011 1000-1020 PT Assessment/Plan  PT - Assessment/Plan Comments on Treatment Session: pt declined activity until 2 hours of CPM Equipment Recommended: Defer to next venue PT Goals  Acute Rehab PT Goals Pt will Perform Home Exercise Program: with supervision, verbal cues required/provided PT Goal: Perform Home Exercise Program - Progress: Progressing toward goal  PT Treatment Precautions/Restrictions  Precautions Precautions: Knee Required Braces or Orthoses: Knee Immobilizer - Right Knee Immobilizer - Right: Discontinue once straight leg raise with < 10 degree lag Restrictions Weight Bearing Restrictions: No Mobility (including Balance) B  Exercise    End of Session General-- Pt left in CPM Behavior During Session: [facility] for tasks performed Cognition: [facility] for tasks performed  Rada Hay 04/19/2011, 4:40 PM

## 2011-04-20 DIAGNOSIS — E876 Hypokalemia: Secondary | ICD-10-CM

## 2011-04-20 LAB — CBC
HCT: 33.2 % — ABNORMAL LOW (ref 36.0–46.0)
Hemoglobin: 11.1 g/dL — ABNORMAL LOW (ref 12.0–15.0)
MCH: 31.7 pg (ref 26.0–34.0)
MCHC: 33.4 g/dL (ref 30.0–36.0)
RDW: 12.5 % (ref 11.5–15.5)

## 2011-04-20 LAB — PROTIME-INR
INR: 1.24 (ref 0.00–1.49)
Prothrombin Time: 15.9 seconds — ABNORMAL HIGH (ref 11.6–15.2)

## 2011-04-20 MED ORDER — ENOXAPARIN SODIUM 30 MG/0.3ML ~~LOC~~ SOLN: 30.0000 mg | Freq: Two times a day (BID) | SUBCUTANEOUS | Status: DC

## 2011-04-20 MED ORDER — OXYCODONE HCL 5 MG PO TABS: 5.0000 mg | ORAL_TABLET | ORAL | Status: AC | PRN

## 2011-04-20 MED ORDER — ACETAMINOPHEN 325 MG PO TABS: 650.0000 mg | ORAL_TABLET | Freq: Four times a day (QID) | ORAL | Status: AC | PRN

## 2011-04-20 MED ORDER — METHOCARBAMOL 500 MG PO TABS: 500.0000 mg | ORAL_TABLET | Freq: Four times a day (QID) | ORAL | Status: AC | PRN

## 2011-04-20 MED ORDER — POLYETHYLENE GLYCOL 3350 17 G PO PACK: 17.0000 g | PACK | Freq: Every day | ORAL | Status: AC | PRN

## 2011-04-20 MED ORDER — DSS 100 MG PO CAPS: 100.0000 mg | ORAL_CAPSULE | Freq: Two times a day (BID) | ORAL | Status: AC

## 2011-04-20 MED ORDER — BISACODYL 10 MG RE SUPP: 10.0000 mg | Freq: Every day | RECTAL | Status: AC | PRN

## 2011-04-20 MED ORDER — WARFARIN - PHARMACIST DOSING INPATIENT: 1.0000 | Freq: Every day | Status: DC

## 2011-04-20 MED ORDER — ONDANSETRON HCL 4 MG PO TABS: 4.0000 mg | ORAL_TABLET | Freq: Four times a day (QID) | ORAL | Status: AC | PRN

## 2011-04-20 NOTE — Progress Notes (Signed)
Subjective: 3 Days Post-Op Procedure(s) (LRB): TOTAL KNEE ARTHROPLASTY (Right) Patient reports pain as mild.   Patient seen in rounds with Dr. Lequita Halt. Patient doing OK and ready to go to [name] today.  Objective: Vital signs in last 24 hours: Temp:  [98.6 F (37 C)-99.3 F (37.4 C)] 99.2 F (37.3 C) (04/18 0458) Pulse Rate:  [89-102] 89  (04/18 0458) Resp:  [20] 20  (04/18 0458) BP: (105-137)/(60-74) 137/72 mmHg (04/18 0458) SpO2:  [91 %-94 %] 94 % (04/18 0458)  Intake/Output from previous day:  Intake/Output Summary (Last 24 hours) at 04/20/11 0957 Last data filed at 04/20/11 0450  Gross per 24 hour  Intake    600 ml  Output    500 ml  Net    100 ml    Intake/Output this shift:    Labs:  Basename 04/20/11 0430 04/19/11 0405 04/18/11 0420  HGB 11.1* 10.9* 12.0    Basename 04/20/11 0430 04/19/11 0405  WBC 9.4 10.7*  RBC 3.50* 3.42*  HCT 33.2* 33.1*  PLT 176 154    Basename 04/19/11 0405 04/18/11 0420  NA 135 137  K 4.0 3.4*  CL 99 101  CO2 30 31  BUN 7 8  CREATININE 0.74 0.71  GLUCOSE 113* 108*  CALCIUM 8.4 8.3*    Basename 04/20/11 0430 04/19/11 0405  LABPT -- --  INR 1.24 1.21    Exam: Neurovascular intact Sensation intact distally Incision - clean, dry, no drainage, healing Motor function intact - moving foot and toes well on exam.   Assessment/Plan: 3 Days Post-Op Procedure(s) (LRB): TOTAL KNEE ARTHROPLASTY (Right) Procedure(s) (LRB): TOTAL KNEE ARTHROPLASTY (Right) Past Medical History  Diagnosis Date  . Arthritis     PAIN AND OA BOTH KNEES  . Hypertension   . Stroke     POSS TIA IN 2012   . Shortness of breath     PT RELATES TO HER WEIGHT-OUT OF SHAPE  . Sleep apnea     STOP BANG SCORE 5   Principal Problem:  *OA (osteoarthritis) of knee   Discharge to SNF - [facility] Diet - heart healthy Follow up - in 2 weeks Activity - WBAT Disposition - Skilled nursing facility Condition Upon Discharge - Good D/C Meds - See  DC Summary DVT Prophylaxis - Lovenox and Coumadin  Shelby Wiley 04/20/2011, 9:57 AM

## 2011-04-20 NOTE — Progress Notes (Signed)
ANTICOAGULATION CONSULT NOTE - Follow Up Consult  Pharmacy Consult for Coumadin Indication: VTE prophylaxis  Allergies  Allergen Reactions  . Codeine Sulfate     REACTION: codiene causes GI upset VOMITING    Patient Measurements: Height: 5\' 5"  (165.1 cm) Weight: 288 lb 4.8 oz (130.772 kg) IBW/kg (Calculated) : 57    Vital Signs: Temp: 99.2 F (37.3 C) (04/18 0458) Temp src: Oral (04/18 0458) BP: 137/72 mmHg (04/18 0458) Pulse Rate: 89  (04/18 0458)  Labs:  Basename 04/20/11 0430 04/19/11 0405 04/18/11 0420  HGB 11.1* 10.9* --  HCT 33.2* 33.1* 37.4  PLT 176 154 167  APTT -- -- --  LABPROT 15.9* 15.6* 13.6  INR 1.24 1.21 1.02  HEPARINUNFRC -- -- --  CREATININE -- 0.74 0.71  CKTOTAL -- -- --  CKMB -- -- --  TROPONINI -- -- --   Estimated Creatinine Clearance: 85.5 ml/min (by C-G formula based on Cr of 0.74).   Medications:  Prescriptions prior to admission  Medication Sig Dispense Refill  . Ascorbic Acid (VITAMIN C PO) Take 1 tablet by mouth daily as needed.  sore throat      . aspirin 325 MG EC tablet Take 325 mg by mouth every other day.      . Calcium Citrate-Vitamin D (EQL CALCIUM CITRATE/VITAMIN D) 315-250 MG-UNIT TABS Take 1 tablet by mouth every other day.      . hydrochlorothiazide (MICROZIDE) 12.5 MG capsule Take 12.5 mg by mouth at bedtime.      Marland Kitchen acetaminophen (TYLENOL) 500 MG tablet Take 500 mg by mouth every 6 (six) hours as needed. Pain       . Carboxymethylcellulose Sodium 0.25 % SOLN Place 3 drops into both eyes 2 (two) times daily as needed. Dry eyes        Scheduled:     . docusate sodium  100 mg Oral BID  . enoxaparin  30 mg Subcutaneous Q12H  . hydrochlorothiazide  12.5 mg Oral QHS  . potassium chloride  40 mEq Oral Daily  . warfarin  6 mg Oral ONCE-1800  . Warfarin - Pharmacist Dosing Inpatient   Does not apply q1800    Assessment:  64 YOF s/p R TKA 4/15, coumadin and Lovenox 30mg  sq q12h started for VTE ppx.    INR up to 1.24  s/p 2 doses of Coumadin 5 mg and 6mg  last night.  Hgb 11.1 < 10.9 < 12.  No bleeding reported.    Plan d/c to SNF today  Education completed yesterday  Goal of Therapy:  INR 2-3   Plan:   Recommend d/c w/Coumadin 7.5mg  daily  INR monitoring per SNF pharmacist  Gwen Her PharmD  [phone] 04/20/2011 9:31 AM

## 2011-04-20 NOTE — Progress Notes (Signed)
Pt to transfer to [place] today via [facility]. Pt and SNF aware of d/c. Pt reports she will notify her family. CSW signing off as no other CSW needs identified. Dellie Burns, MSW, LCSWA [phone] (covering)

## 2011-04-20 NOTE — Progress Notes (Signed)
Physical Therapy Treatment Patient Details Name: Shelby Wiley MRN: [id number] DOB: Dec 05, 1935 Today's Date: 04/20/2011 4098-1191 PT Assessment/Plan  PT - Assessment/Plan Comments on Treatment Session: pt continues to improve w/ mobility PT Plan: Discharge plan remains appropriate PT Goals  Acute Rehab PT Goals Pt will go Sit to Stand: with min assist PT Goal: Sit to Stand - Progress: Progressing toward goal Pt will go Stand to Sit: with min assist PT Goal: Stand to Sit - Progress: Progressing toward goal Pt will Transfer Bed to Chair/Chair to Bed: with min assist PT Transfer Goal: Bed to Chair/Chair to Bed - Progress: Progressing toward goal Pt will Ambulate: 16 - 50 feet;with rolling walker PT Goal: Ambulate - Progress: Progressing toward goal  PT Treatment Precautions/Restrictions  Precautions Precautions: Knee Required Braces or Orthoses: Knee Immobilizer - Right Knee Immobilizer - Right: Discontinue once straight leg raise with < 10 degree lag Restrictions Weight Bearing Restrictions: No Mobility (including Balance) Bed Mobility Transfers Sit to Stand: 1: +2 Total assist;From chair/3-in-1 Sit to Stand Details (indicate cue type and reason): pt required lifting assistance,vc to push from recliner Stand to Sit: 3: Mod assist Stand to Sit Details: vc to reach to recliner, step RLE forward Stand Pivot Transfers: 1: +2 Total assist Stand Pivot Transfer Details (indicate cue type and reason): pt was able to take several pivot steps around from recliner to bsc to recliner Ambulation/Gait Ambulation/Gait: Yes Ambulation/Gait Assistance: 1: +2 Total assist Ambulation/Gait Assistance Details (indicate cue type and reason): w/ much encouragement pt did ambulate a short didstance Ambulation Distance (Feet):  Assistive device: Rolling walker Gait Pattern: Step-to pattern Gait velocity: slow    Exercise    End of Session PT - End of Session Equipment Utilized During  Treatment: Right knee immobilizer Activity Tolerance: Patient tolerated treatment well Patient left: in chair;with call bell in reach Nurse Communication: Mobility status for transfers General Behavior During Session: [facility] for tasks performed Cognition: [facility] for tasks performed  Rada Hay 04/20/2011, 3:45 PM

## 2011-04-20 NOTE — Progress Notes (Signed)
Physical Therapy Treatment Patient Details Name: Shelby Wiley MRN: [id number] DOB: Feb 20, 1935 Today's Date: 04/20/2011 1100-1125 PT Assessment/Plan  PT - Assessment/Plan Comments on Treatment Session: pt finally able to begin some ambulation. Less c/o dizziness, for rehab today. PT Plan: Discharge plan remains appropriate PT Goals  Acute Rehab PT Goals Pt will go Sit to Stand: with min assist PT Goal: Sit to Stand - Progress: Progressing toward goal Pt will go Stand to Sit: with min assist PT Goal: Stand to Sit - Progress: Progressing toward goal Pt will Transfer Bed to Chair/Chair to Bed: with min assist PT Transfer Goal: Bed to Chair/Chair to Bed - Progress: Progressing toward goal Pt will Ambulate: 16 - 50 feet;with rolling walker PT Goal: Ambulate - Progress: Progressing toward goal  PT Treatment Precautions/Restrictions  Precautions Precautions: Knee Required Braces or Orthoses: Knee Immobilizer - Right Knee Immobilizer - Right: Discontinue once straight leg raise with < 10 degree lag Restrictions Weight Bearing Restrictions: No Mobility (including Balance) Bed Mobility Supine to Sit: 1: +2 Total assist Supine to Sit Details (indicate cue type and reason): HOB 50, use of rails, pt=40% Transfers Sit to Stand: 1: +2 Total assist;From bed;From elevated surface Sit to Stand Details (indicate cue type and reason): Pt did stand  from bed, pt= 60% Stand to Sit: 1: +2 Total assist;To chair/3-in-1 Stand to Sit Details: vc to place RLE and reach to handrests Ambulation/Gait Ambulation/Gait: Yes Ambulation/Gait Assistance: 1: +2 Total assist Ambulation/Gait Assistance Details (indicate cue type and reason): w/ much encouragement pt did ambulate a short didstance Ambulation Distance (Feet): 6 Feet Assistive device: Rolling walker Gait Pattern: Step-to pattern Gait velocity: slow    Exercise    End of Session PT - End of Session Equipment Utilized During Treatment: Right  knee immobilizer Activity Tolerance: Patient tolerated treatment well Patient left: in chair;with call bell in reach Nurse Communication: Mobility status for transfers General Behavior During Session: [facility] for tasks performed Cognition: [facility] for tasks performed  Rada Hay 04/20/2011, 3:37 PM  [phone]

## 2011-04-20 NOTE — Discharge Instructions (Signed)
Knee Rehabilitation, Guidelines Following Surgery Results after knee surgery are often greatly improved when you follow the exercise, range of motion and muscle strengthening exercises prescribed by your doctor. Safety measures are also important to protect the knee from further injury. Any time any of these exercises cause you to have increased pain or swelling in your knee joint, decrease the amount until you are comfortable again and slowly increase them. If you have problems or questions, call your caregiver or physical therapist for advice. HOME CARE INSTRUCTIONS   Remove items at home which could result in a fall. This includes throw rugs or furniture in walking pathways.   Continue medications as instructed.   You may shower or take tub baths when your staples or stitches are removed or as instructed.   Walk using crutches or walker as instructed.   Put weight on your legs and walk as much as is comfortable.   You may resume a sexual relationship in one month or when given the OK by your doctor.   Return to work as instructed by your doctor.   Do not drive a car for 6 weeks or as instructed.   Wear elastic stockings until instructed not to.   Make sure you keep all of your appointments after your operation with all of your doctors and caregivers.  RANGE OF MOTION AND STRENGTHENING EXERCISES Rehabilitation of the knee is important following a knee injury or an operation. After just a few days of immobilization, the muscles of the thigh which control the knee become weakened and shrink (atrophy). Knee exercises are designed to build up the tone and strength of the thigh muscles and to improve knee motion. Often times heat used for twenty to thirty minutes before working out will loosen up your tissues and help with improving the range of motion. These exercises can be done on a training (exercise) mat, on the floor, on a table or on a bed. Use what ever works the best and is most  comfortable for you Knee exercises include:  Leg Lifts - While your knee is still immobilized in a splint or cast, you can do straight leg raises. Lift the leg to 60 degrees, hold for 3 sec, and slowly lower the leg. Repeat 10-20 times 2-3 times daily. Perform this exercise against resistance later as your knee gets better.   Quad and Hamstring Sets - Tighten up the muscle on the front of the thigh (Quad) and hold for 5-10 sec. Repeat this 10-20 times hourly. Hamstring sets are done by pushing the foot backward against an object and holding for 5-10 sec. Repeat as with quad sets.  A rehabilitation program following serious knee injuries can speed recovery and prevent re-injury in the future due to weakened muscles. Contact your doctor or a physical therapist for more information on knee rehabilitation. MAKE SURE YOU:   Understand these instructions.   Will watch your condition.   Will get help right away if you are not doing well or get worse.  Document Released: 12/19/2004 Document Revised: 12/08/2010 Document Reviewed: 06/08/2006 [facility] Patient Information 2012 [place], [place].  Pick up stool softner and laxative for home. Do not submerge incision under water. May shower. Continue to use ice for pain and swelling from surgery.  Take Coumadin for three weeks and then discontinue.  The dose may need to be adjusted based upon the INR.  Please follow the INR and titrate Coumadin dose for a therapeutic range between 2.0 and 3.0 INR.  After completing the three weeks of Coumadin, the patient may stop the Coumadin and resume their 325 mg Aspirin daily.  When discharged from the skilled rehab facility, please have the facility set up the patient's Home Health Physical Therapy prior to being released.  Also provide the patient with their medications at time of release from the facility to include their pain medication, the muscle relaxants, and their blood thinner medication.  Please also assist  the patient in arranging follow up appointment in our office and any transportation needs.

## 2011-04-20 NOTE — Discharge Summary (Signed)
Physician Discharge Summary   Patient ID: Shelby Wiley MRN: [id number] DOB/AGE: 1935/08/23 76 y.o.  Admit date: 04/17/2011 Discharge date: 04/20/2011  Primary Diagnosis: Osteoarthritis Bilateral Knees  Admission Diagnoses:  Past Medical History  Diagnosis Date  . Arthritis     PAIN AND OA BOTH KNEES  . Hypertension   . Stroke     POSS TIA IN 2012   . Shortness of breath     PT RELATES TO HER WEIGHT-OUT OF SHAPE  . Sleep apnea     STOP BANG SCORE 5   Discharge Diagnoses:   Principal Problem:  *OA (osteoarthritis) of knee Active Problems:  Postop Hypokalemia  Procedure:  Procedure(s) (LRB): TOTAL KNEE ARTHROPLASTY (Right)   Consults: None  HPI: Shelby Wiley is a 76 y.o. year old female with end stage OA of her right knee with progressively worsening pain and dysfunction. She has constant pain, with activity and at rest and significant functional deficits with difficulties even with ADLs. She has had extensive non-op management including analgesics, injections of cortisone, and home exercise program, but remains in significant pain with significant dysfunction.Radiographs show bone on bone arthritis medial and patellofemoral with tibial subluxation. She presents now for left Total Knee Arthroplasty.  Laboratory Data: Hospital Outpatient Visit on 04/06/2011  Component Date Value Range Status  . MRSA, PCR  04/06/2011 NEGATIVE  NEGATIVE Final  . Staphylococcus aureus  04/06/2011 NEGATIVE  NEGATIVE Final   Comment:                                 The Xpert SA Assay (FDA                          approved for NASAL specimens                          only), is one component of                          a comprehensive surveillance                          program.  It is not intended                          to diagnose infection nor to                          guide or monitor treatment.  Marland Kitchen aPTT (seconds) 04/06/2011 35  24-37 Final  . Prothrombin Time (seconds) 04/06/2011  13.1  11.6-15.2 Final  . INR  04/06/2011 0.97  0.00-1.49 Final  . Color, Urine  04/06/2011 YELLOW  YELLOW Final  . APPearance  04/06/2011 CLEAR  CLEAR Final  . Specific Gravity, Urine  04/06/2011 1.022  1.005-1.030 Final  . pH  04/06/2011 5.5  5.0-8.0 Final  . Glucose, UA (mg/dL) 65/78/4696 NEGATIVE  NEGATIVE Final  . Hgb urine dipstick  04/06/2011 NEGATIVE  NEGATIVE Final  . Bilirubin Urine  04/06/2011 NEGATIVE  NEGATIVE Final  . Ketones, ur (mg/dL) 29/52/8413 NEGATIVE  NEGATIVE Final  . Protein, ur (mg/dL) 24/40/1027 NEGATIVE  NEGATIVE Final  . Urobilinogen, UA (mg/dL) 25/36/6440 0.2  3.4-7.4 Final  . Nitrite  04/06/2011 NEGATIVE  NEGATIVE  Final  . Leukocytes, UA  04/06/2011 NEGATIVE  NEGATIVE Final   MICROSCOPIC NOT DONE ON URINES WITH NEGATIVE PROTEIN, BLOOD, LEUKOCYTES, NITRITE, OR GLUCOSE <1000 mg/dL.    Basename 04/20/11 0430 04/19/11 0405 04/18/11 0420  HGB 11.1* 10.9* 12.0    Basename 04/20/11 0430 04/19/11 0405  WBC 9.4 10.7*  RBC 3.50* 3.42*  HCT 33.2* 33.1*  PLT 176 154    Basename 04/19/11 0405 04/18/11 0420  NA 135 137  K 4.0 3.4*  CL 99 101  CO2 30 31  BUN 7 8  CREATININE 0.74 0.71  GLUCOSE 113* 108*  CALCIUM 8.4 8.3*    Basename 04/20/11 0430 04/19/11 0405  LABPT -- --  INR 1.24 1.21    X-Rays:Dg Chest 2 View  04/06/2011  *RADIOLOGY REPORT*  Clinical Data: Smoker, hypertension, preop evaluation for knee replacement  CHEST - 2 VIEW  Comparison: 11/30/2006  Findings: Prominent heart size and chronic bronchitic changes.  No significant change.  Negative for pneumonia, collapse, consolidation, edema, effusion or pneumothorax.  Trachea midline. Degenerative changes of the spine.  Prominent basilar vascular markings more so on the right.  IMPRESSION: Stable exam.  Mild bronchitic changes.  No superimposed acute process.  Original Report Authenticated By: Judie Petit. Ruel Favors, M.D.    EKG:No orders found for this or any previous visit.   Hospital Course: Patient  was admitted to [facility] and taken to the OR and underwent the above state procedure without complications.  Patient tolerated the procedure well and was later transferred to the recovery room and then to the orthopaedic floor for postoperative care.  They were given PO and IV analgesics for pain control following their surgery.  They were given 24 hours of postoperative antibiotics and started on DVT prophylaxis in the form of Lovenox and Coumadin.   PT and OT were ordered for total joint protocol.  Discharge planning consulted to help with postop disposition and equipment needs.  Patient had a rough night on the evening of surgery due to pain but started to get up OOB with therapy on day one.  PCA Morphine was discontinued and they were weaned over to PO meds.  Hemovac drain was pulled without difficulty.  Continued to work with therapy into day two.  Dressing was changed on day two and the incision was healing well.  By day three, the patient had progressed with therapy and meeting their goals.  Incision was healing well.  Patient was seen in rounds and was ready to go to [facility].  Discharge Medications: Prior to Admission medications   Medication Sig Start Date End Date Taking? Authorizing Provider  hydrochlorothiazide (MICROZIDE) 12.5 MG capsule Take 12.5 mg by mouth at bedtime.   Yes Historical Provider, MD  acetaminophen (TYLENOL) 325 MG tablet Take 2 tablets (650 mg total) by mouth every 6 (six) hours as needed (or Fever >/= 101). 04/20/11 04/19/12  Jarrod Mcenery Julien Girt, PA  bisacodyl (DULCOLAX) 10 MG suppository Place 1 suppository (10 mg total) rectally daily as needed. 04/20/11 04/30/11  Sueann Brownley, PA  Carboxymethylcellulose Sodium 0.25 % SOLN Place 3 drops into both eyes 2 (two) times daily as needed. Dry eyes     Historical Provider, MD  docusate sodium 100 MG CAPS Take 100 mg by mouth 2 (two) times daily. 04/20/11 04/30/11  Jason Frisbee, PA  enoxaparin (LOVENOX) 30  MG/0.3ML injection Inject 0.3 mLs (30 mg total) into the skin every 12 (twelve) hours. Continue Lovenox injections until the INR is equal to  or greater than 2.0 on the Coumadin protocol. Once the INR is therapeutic, may discontinue the Lovenox injections. 04/20/11   Deah Ottaway Julien Girt, PA  methocarbamol (ROBAXIN) 500 MG tablet Take 1 tablet (500 mg total) by mouth every 6 (six) hours as needed. 04/20/11 04/30/11  Angely Dietz, PA  ondansetron (ZOFRAN) 4 MG tablet Take 1 tablet (4 mg total) by mouth every 6 (six) hours as needed for nausea. 04/20/11 04/27/11  Korina Tretter, PA  oxyCODONE (OXY IR/ROXICODONE) 5 MG immediate release tablet Take 1-2 tablets (5-10 mg total) by mouth every 4 (four) hours as needed for pain. 04/20/11 04/30/11  Loreen Bankson, PA  polyethylene glycol (MIRALAX / GLYCOLAX) packet Take 17 g by mouth daily as needed. 04/20/11 04/23/11  Lio Wehrly, PA  Warfarin - Pharmacist Dosing Inpatient MISC 1 each by Does not apply route daily at 6 PM. Take Coumadin for three weeks and then discontinue.  The dose may need to be adjusted based upon the INR.  Please follow the INR and titrate Coumadin dose for a therapeutic range between 2.0 and 3.0 INR. 04/20/11   Eldoris Beiser Julien Girt, PA    Diet: heart healthy Activity:WBAT Follow-up:in 2 weeks Disposition - Skilled nursing facility Discharged Condition: good   Discharge Orders    Future Orders Please Complete By Expires   Diet - low sodium heart healthy      Call MD / Call 911      Comments:   If you experience chest pain or shortness of breath, CALL 911 and be transported to the hospital emergency room.  If you develope a fever above 101 F, pus (white drainage) or increased drainage or redness at the wound, or calf pain, call your surgeon's office.   Constipation Prevention      Comments:   Drink plenty of fluids.  Prune juice may be helpful.  You may use a stool softener, such as Colace (over the counter) 100 mg  twice a day.  Use MiraLax (over the counter) for constipation as needed.   Increase activity slowly as tolerated      Weight Bearing as taught in Physical Therapy      Comments:   Use a walker or crutches as instructed.   Driving restrictions      Comments:   No driving   Lifting restrictions      Comments:   No lifting   Discharge instructions      Comments:   Pick up stool softner and laxative for home. Do not submerge incision under water. May shower. Continue to use ice for pain and swelling from surgery.  Take Coumadin for three weeks and then discontinue.  The dose may need to be adjusted based upon the INR.  Please follow the INR and titrate Coumadin dose for a therapeutic range between 2.0 and 3.0 INR.  After completing the three weeks of Coumadin, the patient may stop the Coumadin and resume their 325 mg Aspirin daily.  When discharged from the skilled rehab facility, please have the facility set up the patient's Home Health Physical Therapy prior to being released.  Also provide the patient with their medications at time of release from the facility to include their pain medication, the muscle relaxants, and their blood thinner medication.  Please also assist the patient in arranging follow up appointment in our office and any transportation needs.    TED hose      Comments:   Use stockings (TED hose) for 3 weeks on both leg(s).  You may remove them at night for sleeping.   Change dressing      Comments:   Change dressing daily with sterile 4 x 4 inch gauze dressing and apply TED hose.   Do not put a pillow under the knee. Place it under the heel.        Medication List  As of 04/20/2011 10:19 AM   STOP taking these medications         aspirin 325 MG EC tablet      EQL CALCIUM CITRATE/VITAMIN D 315-250 MG-UNIT Tabs      VITAMIN C PO         TAKE these medications         acetaminophen 325 MG tablet   Commonly known as: TYLENOL   Take 2 tablets (650 mg total) by  mouth every 6 (six) hours as needed (or Fever >/= 101).      bisacodyl 10 MG suppository   Commonly known as: DULCOLAX   Place 1 suppository (10 mg total) rectally daily as needed.      Carboxymethylcellulose Sodium 0.25 % Soln   Place 3 drops into both eyes 2 (two) times daily as needed. Dry eyes        DSS 100 MG Caps   Take 100 mg by mouth 2 (two) times daily.      enoxaparin 30 MG/0.3ML injection   Commonly known as: LOVENOX   Inject 0.3 mLs (30 mg total) into the skin every 12 (twelve) hours. Continue Lovenox injections until the INR is equal to or greater than 2.0 on the Coumadin protocol. Once the INR is therapeutic, may discontinue the Lovenox injections.      hydrochlorothiazide 12.5 MG capsule   Commonly known as: MICROZIDE   Take 12.5 mg by mouth at bedtime.      methocarbamol 500 MG tablet   Commonly known as: ROBAXIN   Take 1 tablet (500 mg total) by mouth every 6 (six) hours as needed.      ondansetron 4 MG tablet   Commonly known as: ZOFRAN   Take 1 tablet (4 mg total) by mouth every 6 (six) hours as needed for nausea.      oxyCODONE 5 MG immediate release tablet   Commonly known as: Oxy IR/ROXICODONE   Take 1-2 tablets (5-10 mg total) by mouth every 4 (four) hours as needed for pain.      polyethylene glycol packet   Commonly known as: MIRALAX / GLYCOLAX   Take 17 g by mouth daily as needed.      Warfarin - Pharmacist Dosing Inpatient Misc   1 each by Does not apply route daily at 6 PM. Take Coumadin for three weeks and then discontinue.  The dose may need to be adjusted based upon the INR.  Please follow the INR and titrate Coumadin dose for a therapeutic range between 2.0 and 3.0 INR.           Follow-up Information    Follow up with Loanne Drilling, MD. Schedule an appointment as soon as possible for a visit in 2 weeks.   Contact information:   [facility] [address] [place]  [address] [phone]          Signed: Patrica Duel 04/20/2011, 10:19 AM

## 2011-04-21 NOTE — Progress Notes (Signed)
Noted a CM consult however CSW involved in case and patient was d/c to SNF.[name] [name]

## 2011-04-23 ENCOUNTER — Other Ambulatory Visit: Payer: Self-pay | Admitting: Orthopedic Surgery

## 2011-04-23 MED ORDER — DEXAMETHASONE SODIUM PHOSPHATE 10 MG/ML IJ SOLN: 10.0000 mg | Freq: Once | INTRAMUSCULAR | Status: DC

## 2011-04-23 MED ORDER — BUPIVACAINE 0.25 % ON-Q PUMP SINGLE CATH 300ML: 300.0000 mL | INJECTION | Status: DC

## 2011-04-28 ENCOUNTER — Encounter (HOSPITAL_COMMUNITY): Payer: Self-pay | Admitting: Orthopedic Surgery

## 2011-05-22 ENCOUNTER — Ambulatory Visit (HOSPITAL_BASED_OUTPATIENT_CLINIC_OR_DEPARTMENT_OTHER): Payer: Medicare Other

## 2011-07-13 ENCOUNTER — Ambulatory Visit (HOSPITAL_BASED_OUTPATIENT_CLINIC_OR_DEPARTMENT_OTHER): Payer: Medicare Other | Attending: Internal Medicine | Admitting: Radiology

## 2011-07-13 VITALS — Ht 65.0 in | Wt 280.0 lb

## 2011-07-13 DIAGNOSIS — G4733 Obstructive sleep apnea (adult) (pediatric): Secondary | ICD-10-CM

## 2011-07-23 DIAGNOSIS — G4733 Obstructive sleep apnea (adult) (pediatric): Secondary | ICD-10-CM

## 2011-07-23 NOTE — Procedures (Signed)
NAME:  Shelby Wiley, Shelby Wiley                 ACCOUNT NO.:  [id number]  MEDICAL RECORD NO.:  [id number]          PATIENT TYPE:  OUT  LOCATION:  SLEEP CENTER                 FACILITY:  [facility]  PHYSICIAN:  Linn Goetze D. Maple Hudson, MD, FCCP, FACPDATE OF BIRTH:  14-Feb-1935  DATE OF STUDY:  07/13/2011                           NOCTURNAL POLYSOMNOGRAM  REFERRING PHYSICIAN:  Tresa Endo L. Philipp Deputy, M.D.  INDICATION FOR STUDY:  Hypersomnia with sleep apnea.  EPWORTH SLEEPINESS SCORE:  3/24.  BMI 46.6, weight 280 pounds.  Height 65 inches, neck 17.5 inches.  MEDICATIONS:  Home medications are charted and reviewed.  SLEEP ARCHITECTURE:  Split study protocol.  During the diagnostic phase, total sleep time 137.5 minutes with sleep efficiency 83.6%.  Stage I was 4.4%, stage II 87.3%, stage III absent, REM 8.4% of total sleep time. Sleep latency 3 minutes, REM latency 150 minutes.  Awake after sleep onset 24 minutes.  Arousal index of 17.  Bedtime medication:  None.  RESPIRATORY DATA:  Split study protocol.  Apnea-hypopnea index (AHI) 17.5 per hour.  A total of 40 events was scored including 3 obstructive apneas and 37 hypopneas.  Events were seen in all sleep positions, especially while supine.  REM AHI 57.4 per hour.  CPAP was titrated to 16 CWP, AHI 3.2 per hour.  She wore a large ResMed Quattro FX full-face mask with heated humidifier.  OXYGEN DATA:  Before CPAP snoring was moderately loud with oxygen desaturation to a nadir of 79% on room air.  With CPAP snoring was prevented and mean oxygen saturation held 92.9% on room air.  CARDIAC DATA:  Sinus rhythm with occasional PVC.  MOVEMENT-PARASOMNIA:  No significant movement disturbance.  Bathroom x3.  IMPRESSIONS-RECOMMENDATIONS: 1. Moderate obstructive sleep apnea/hypopnea syndrome, AHI 17.5 per     hour with events in all sleep positions.  Moderate snoring with     oxygen desaturation to a nadir of 79% on room air. 2. Successful CPAP titration to 16 CWP, AHI  3.2 per hour.  She wore a     large ResMed Quattro FX full-face mask     with heated humidifier.  Snoring was prevented and mean oxygen     saturation held 92.9% on room air. 3. Note:  Effective sleep disturbance by need for bathroom x3.     Keifer Habib D. Maple Hudson, MD, [facility], FACP Diplomate, American Board of Sleep Medicine    CDY/MEDQ  D:  07/23/2011 09:23:40  T:  07/23/2011 09:38:27  Job:  [id number]

## 2011-10-05 ENCOUNTER — Other Ambulatory Visit: Payer: Self-pay | Admitting: Orthopedic Surgery

## 2011-10-05 MED ORDER — DEXAMETHASONE SODIUM PHOSPHATE 10 MG/ML IJ SOLN: 10.0000 mg | Freq: Once | INTRAMUSCULAR | Status: DC

## 2011-10-05 MED ORDER — BUPIVACAINE 0.25 % ON-Q PUMP SINGLE CATH 300ML: 300.0000 mL | INJECTION | Status: DC

## 2011-10-05 NOTE — Progress Notes (Signed)
Preoperative surgical orders have been place into the Epic hospital system for Shelby Wiley on 10/05/2011, 2:07 PM  by Patrica Duel for surgery on 11/13/2011.  Preop Total Knee orders including Bupivacaine On-Q pump, IV Tylenol, and IV Decadron as long as there are no contraindications to the above medications. Avel Peace, PA-C

## 2011-10-26 ENCOUNTER — Other Ambulatory Visit (HOSPITAL_COMMUNITY): Payer: Self-pay | Admitting: Family Medicine

## 2011-10-26 DIAGNOSIS — Z1231 Encounter for screening mammogram for malignant neoplasm of breast: Secondary | ICD-10-CM

## 2011-11-10 ENCOUNTER — Ambulatory Visit (HOSPITAL_COMMUNITY)
Admission: RE | Admit: 2011-11-10 | Discharge: 2011-11-10 | Disposition: A | Discharge status: Home / Self Care | Payer: Medicare Other | Source: Ambulatory Visit | Attending: Family Medicine | Admitting: Family Medicine

## 2011-11-10 DIAGNOSIS — Z1231 Encounter for screening mammogram for malignant neoplasm of breast: Secondary | ICD-10-CM | POA: Insufficient documentation

## 2011-11-13 ENCOUNTER — Inpatient Hospital Stay: Admit: 2011-11-13 | Payer: Medicare Other | Admitting: Orthopedic Surgery

## 2011-11-13 SURGERY — ARTHROPLASTY, KNEE, TOTAL
Anesthesia: Choice | Laterality: Left

## 2012-10-07 ENCOUNTER — Other Ambulatory Visit (HOSPITAL_COMMUNITY): Payer: Self-pay | Admitting: Family Medicine

## 2012-10-07 DIAGNOSIS — Z1231 Encounter for screening mammogram for malignant neoplasm of breast: Secondary | ICD-10-CM

## 2012-11-11 ENCOUNTER — Ambulatory Visit (HOSPITAL_COMMUNITY)
Admission: RE | Admit: 2012-11-11 | Discharge: 2012-11-11 | Disposition: A | Discharge status: Home / Self Care | Payer: Medicare Other | Source: Ambulatory Visit | Attending: Family Medicine | Admitting: Family Medicine

## 2012-11-11 DIAGNOSIS — Z1231 Encounter for screening mammogram for malignant neoplasm of breast: Secondary | ICD-10-CM | POA: Insufficient documentation

## 2013-04-11 IMAGING — CR DG KNEE COMPLETE 4+V*L*
4 series · 4 of 4 positions shown · non-contrast
Comparison: None

CLINICAL DATA: Fall

LEFT KNEE - COMPLETE 4+ VIEW

[t knee ap left]
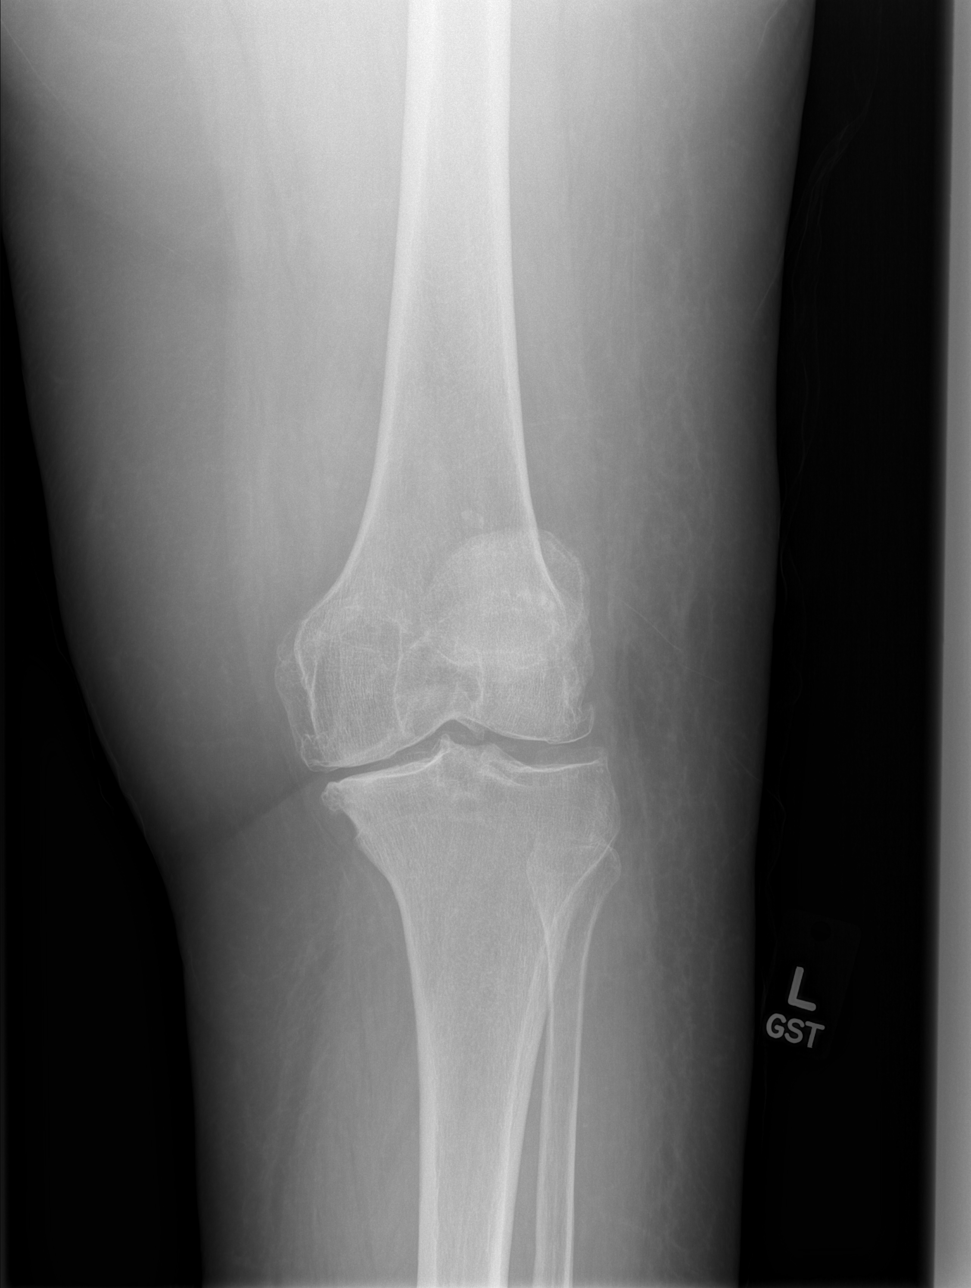

[t knee oblique left * (1 of 2)]
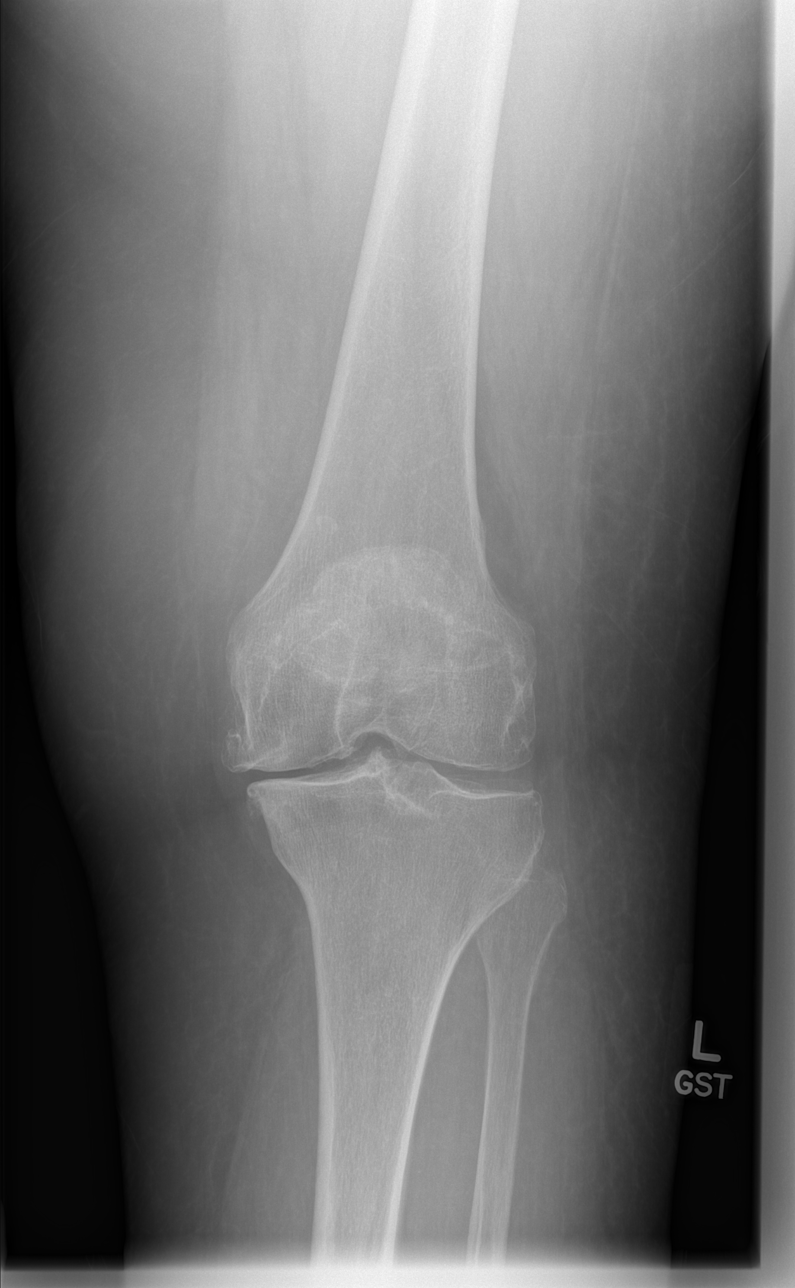

[t knee oblique left * (2 of 2)]
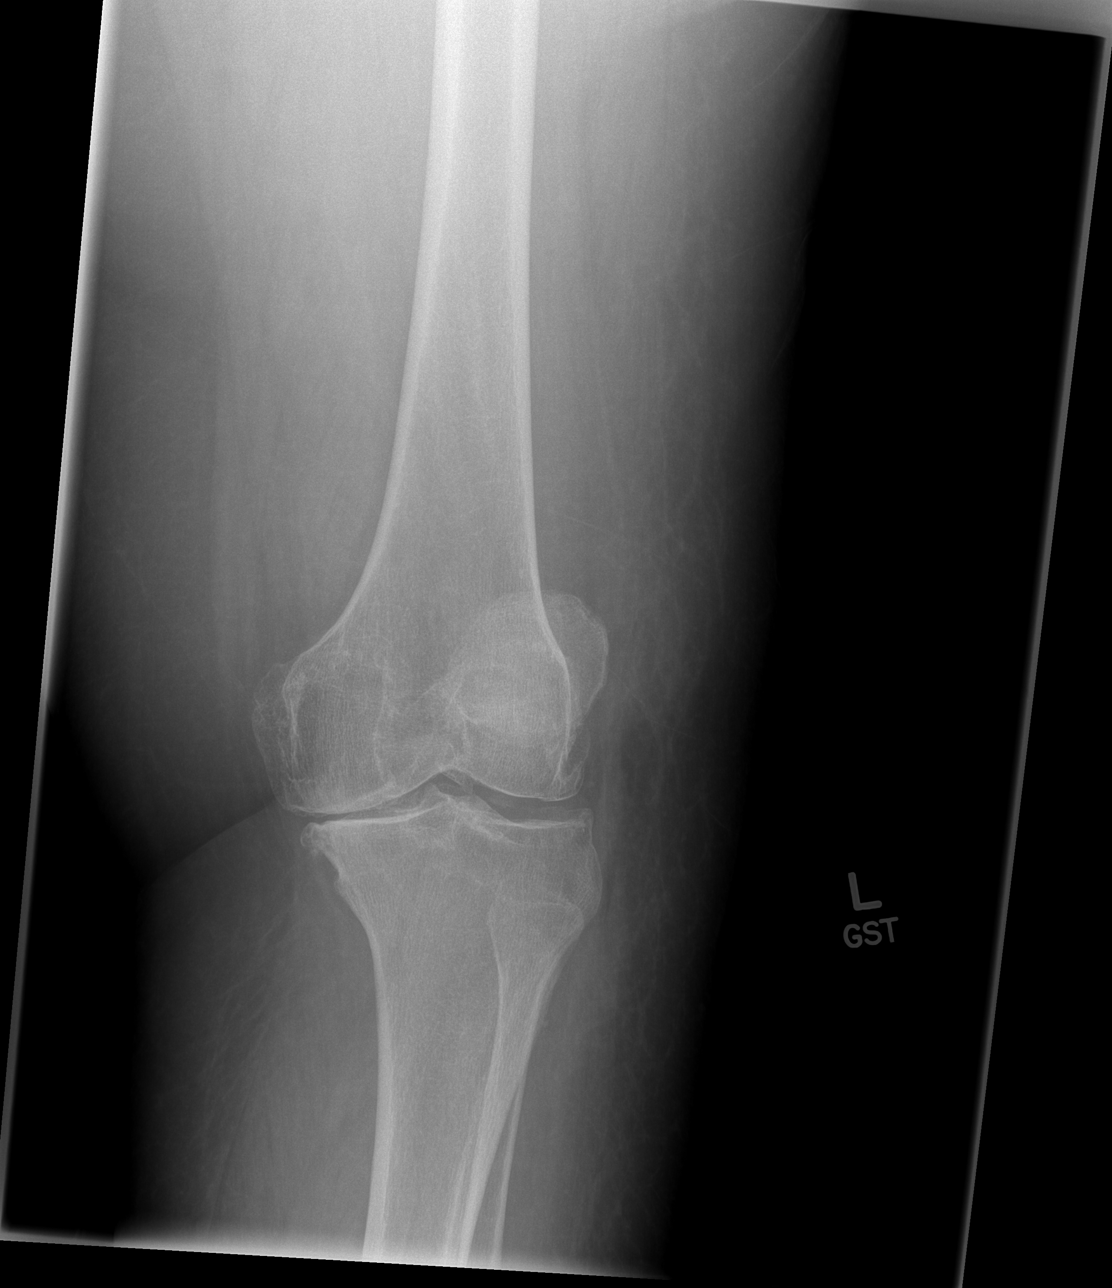

[t knee lat left *]
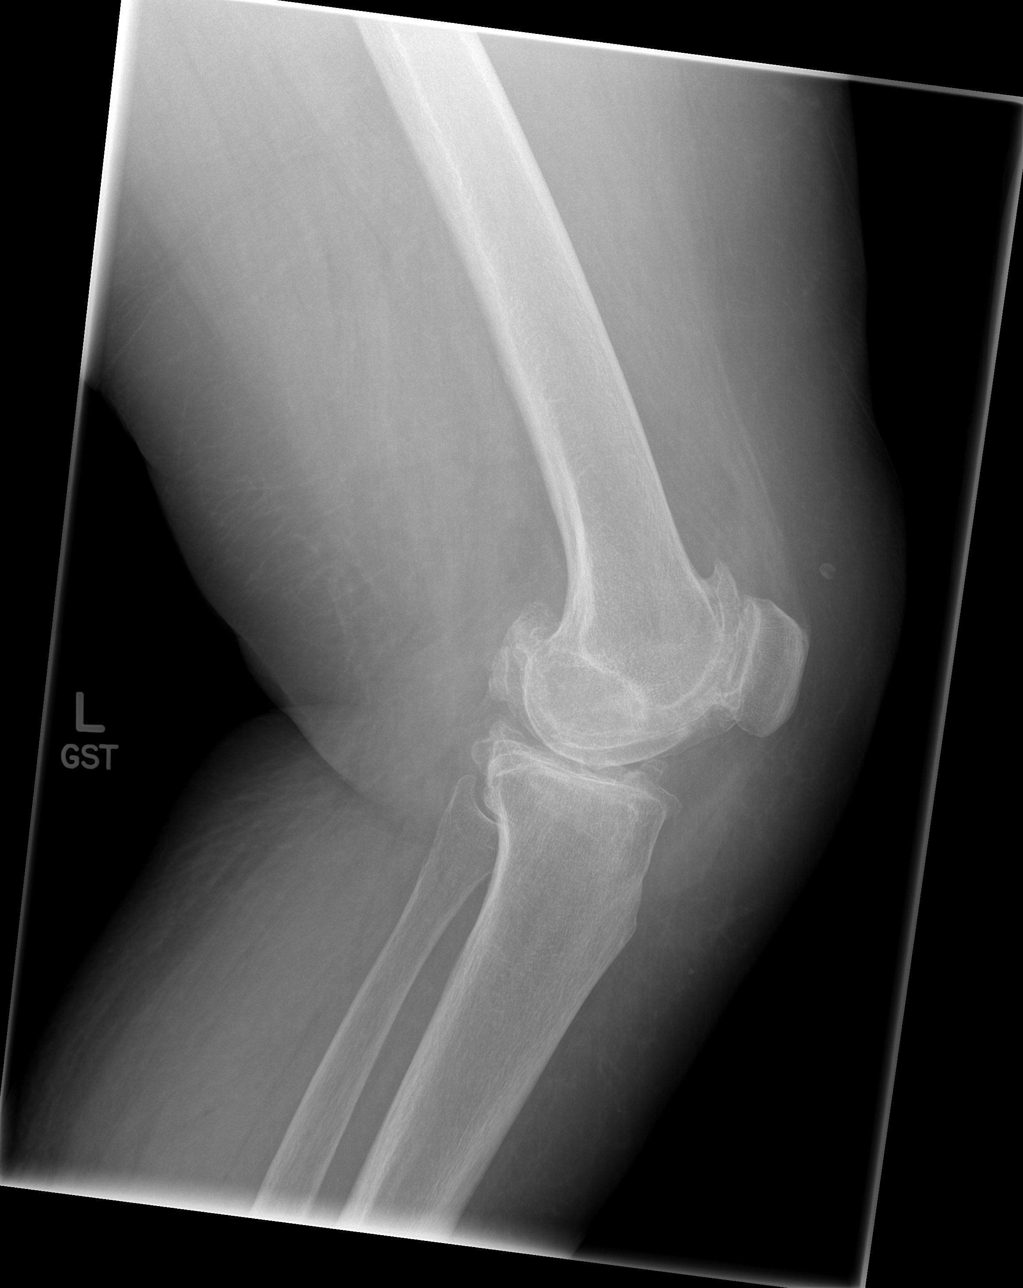

[4 of 4 positions shown; findings below may reference images not displayed]

FINDINGS: Severe tricompartmental degenerative change with joint
space narrowing, cartilage loss, and extensive spurring.  No
effusion or fracture.
IMPRESSION: Advanced tricompartmental degenerative change without fracture.

## 2013-04-11 IMAGING — CR DG FOOT COMPLETE 3+V*L*
3 series · 3 of 3 positions shown · non-contrast
Comparison: None.

CLINICAL DATA: Fall 1 week ago

LEFT FOOT - COMPLETE 3+ VIEW

[t foot ap left *]
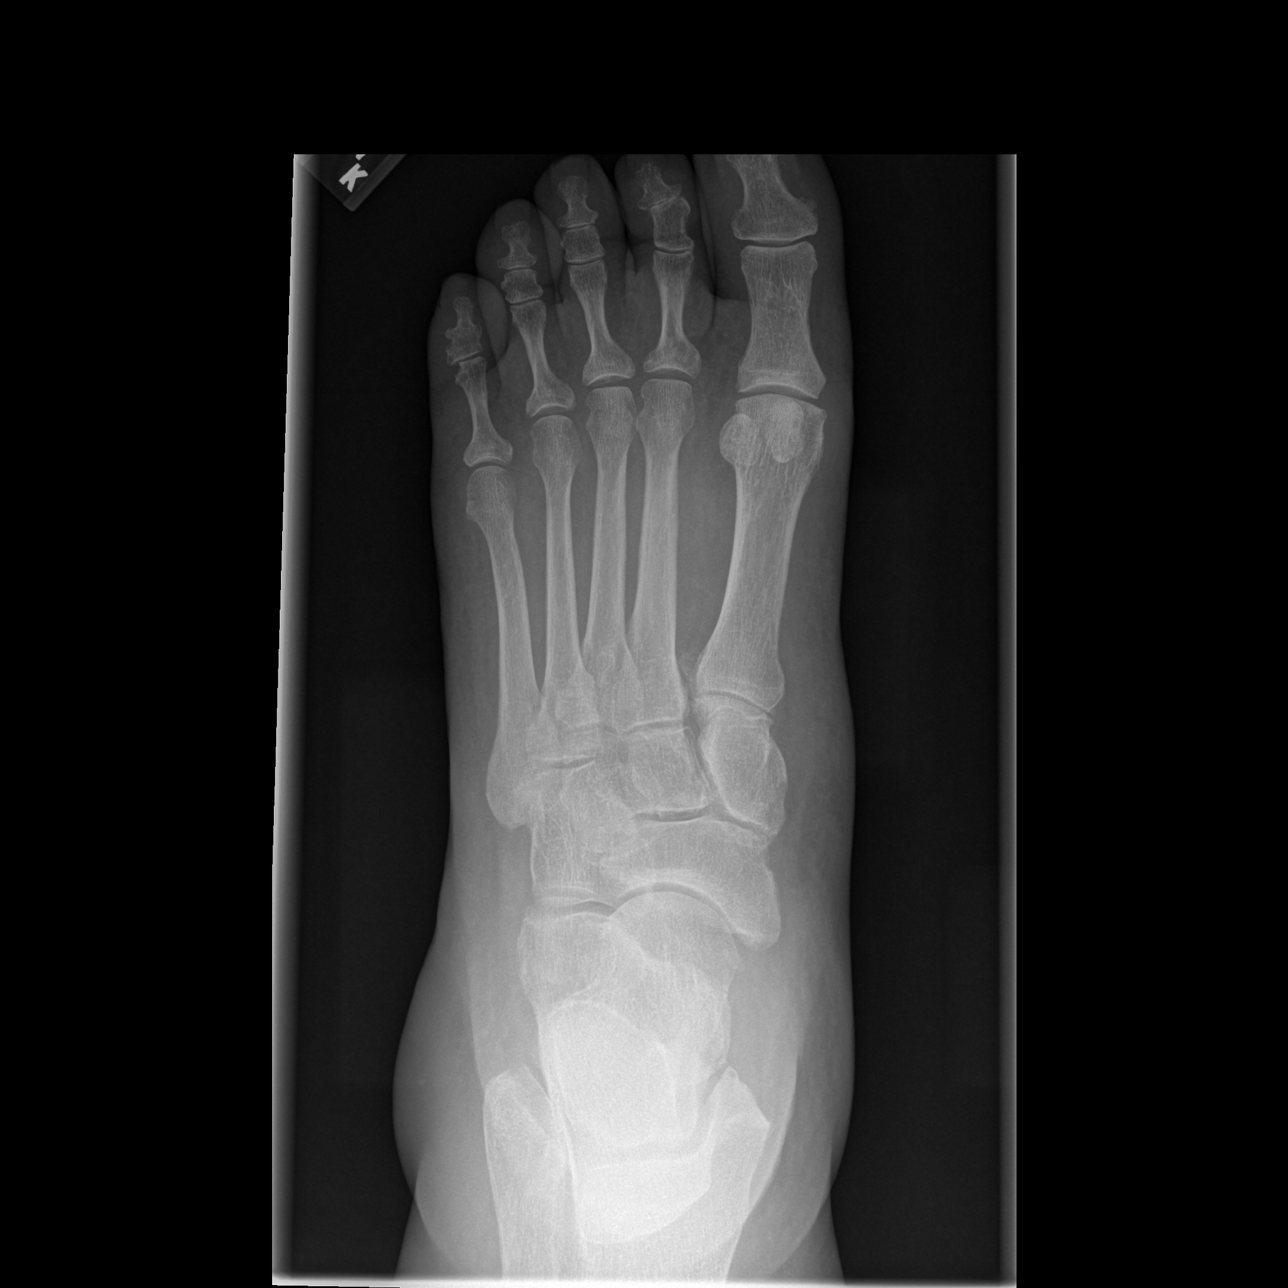

[t foot oblique left *]
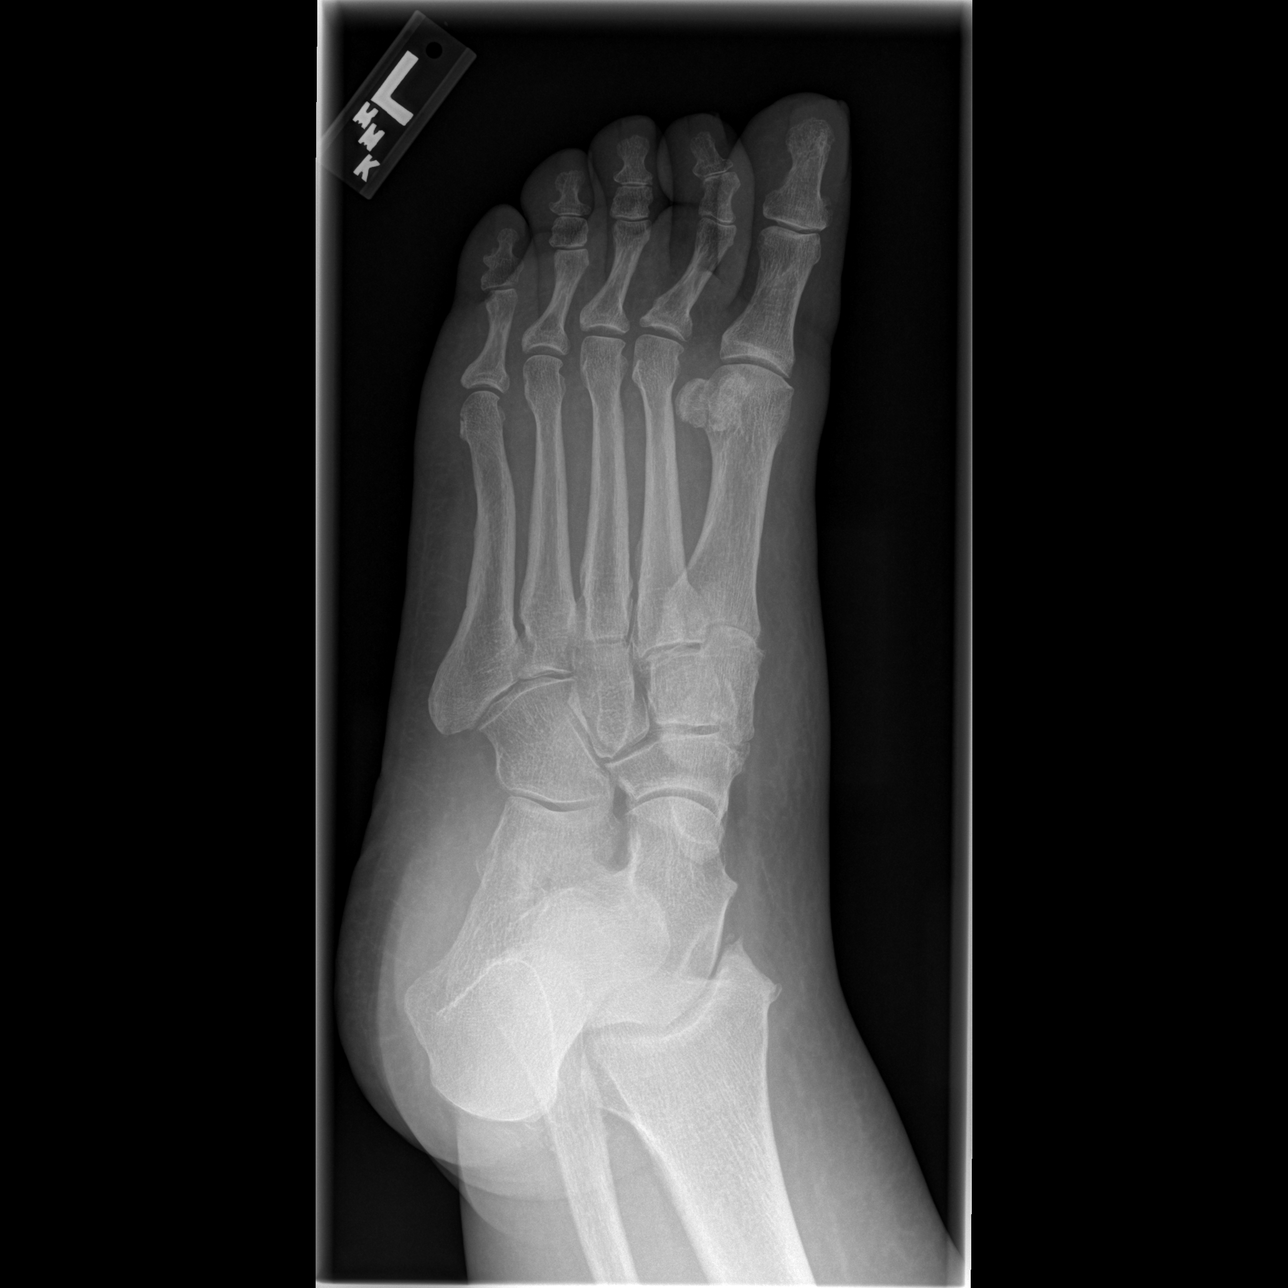

[t foot lat left *]
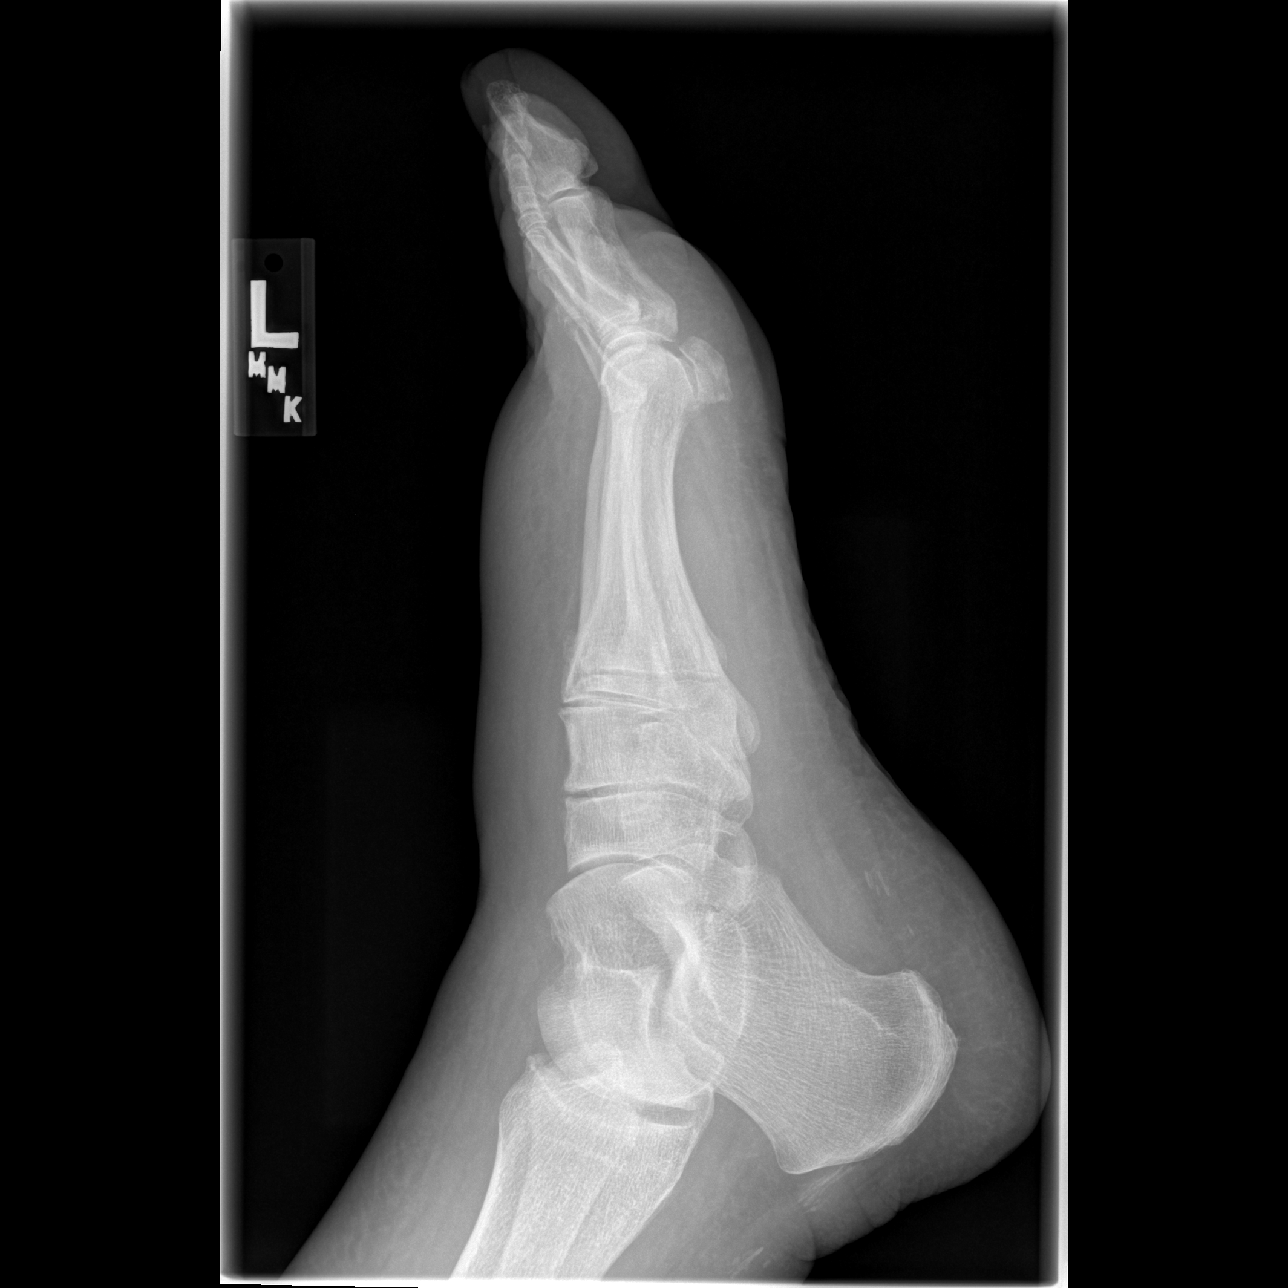

[3 of 3 positions shown; findings below may reference images not displayed]

FINDINGS: Negative for fracture.  Normal alignment.  Negative for
erosion or degenerative change.
IMPRESSION: .  Negative

## 2013-10-27 ENCOUNTER — Other Ambulatory Visit (HOSPITAL_COMMUNITY): Payer: Self-pay | Admitting: Family Medicine

## 2013-10-27 DIAGNOSIS — E2839 Other primary ovarian failure: Secondary | ICD-10-CM

## 2013-11-11 ENCOUNTER — Ambulatory Visit (HOSPITAL_COMMUNITY)
Admission: RE | Admit: 2013-11-11 | Discharge: 2013-11-11 | Disposition: A | Discharge status: Home / Self Care | Payer: Medicare Other | Source: Ambulatory Visit | Attending: Family Medicine | Admitting: Family Medicine

## 2013-11-11 ENCOUNTER — Other Ambulatory Visit (HOSPITAL_COMMUNITY): Payer: Self-pay | Admitting: Family Medicine

## 2013-11-11 DIAGNOSIS — Z1231 Encounter for screening mammogram for malignant neoplasm of breast: Secondary | ICD-10-CM

## 2013-11-11 DIAGNOSIS — Z13820 Encounter for screening for osteoporosis: Secondary | ICD-10-CM | POA: Insufficient documentation

## 2013-11-11 DIAGNOSIS — E2839 Other primary ovarian failure: Secondary | ICD-10-CM

## 2013-11-11 DIAGNOSIS — Z780 Asymptomatic menopausal state: Secondary | ICD-10-CM | POA: Diagnosis not present

## 2013-11-19 ENCOUNTER — Ambulatory Visit (HOSPITAL_COMMUNITY): Payer: Medicare Other

## 2013-11-24 ENCOUNTER — Other Ambulatory Visit: Payer: Self-pay | Admitting: Dermatology

## 2013-12-03 ENCOUNTER — Ambulatory Visit (HOSPITAL_COMMUNITY)
Admission: RE | Admit: 2013-12-03 | Discharge: 2013-12-03 | Disposition: A | Discharge status: Home / Self Care | Payer: Medicare Other | Source: Ambulatory Visit | Attending: Family Medicine | Admitting: Family Medicine

## 2013-12-03 DIAGNOSIS — Z1231 Encounter for screening mammogram for malignant neoplasm of breast: Secondary | ICD-10-CM

## 2014-12-24 ENCOUNTER — Other Ambulatory Visit: Payer: Self-pay

## 2014-12-24 DIAGNOSIS — Z1231 Encounter for screening mammogram for malignant neoplasm of breast: Secondary | ICD-10-CM

## 2014-12-30 ENCOUNTER — Ambulatory Visit: Payer: PRIVATE HEALTH INSURANCE

## 2014-12-31 ENCOUNTER — Ambulatory Visit
Admission: RE | Admit: 2014-12-31 | Discharge: 2014-12-31 | Disposition: A | Discharge status: Home / Self Care | Payer: Medicare Other | Source: Ambulatory Visit

## 2014-12-31 DIAGNOSIS — Z1231 Encounter for screening mammogram for malignant neoplasm of breast: Secondary | ICD-10-CM

## 2015-05-26 ENCOUNTER — Encounter (HOSPITAL_COMMUNITY): Payer: Self-pay | Admitting: Emergency Medicine

## 2015-05-26 DIAGNOSIS — Z7982 Long term (current) use of aspirin: Secondary | ICD-10-CM | POA: Insufficient documentation

## 2015-05-26 DIAGNOSIS — I10 Essential (primary) hypertension: Secondary | ICD-10-CM | POA: Insufficient documentation

## 2015-05-26 DIAGNOSIS — R197 Diarrhea, unspecified: Secondary | ICD-10-CM | POA: Diagnosis not present

## 2015-05-26 DIAGNOSIS — Z96651 Presence of right artificial knee joint: Secondary | ICD-10-CM | POA: Insufficient documentation

## 2015-05-26 DIAGNOSIS — Z8673 Personal history of transient ischemic attack (TIA), and cerebral infarction without residual deficits: Secondary | ICD-10-CM | POA: Diagnosis not present

## 2015-05-26 DIAGNOSIS — Z79899 Other long term (current) drug therapy: Secondary | ICD-10-CM | POA: Diagnosis not present

## 2015-05-26 DIAGNOSIS — Z87891 Personal history of nicotine dependence: Secondary | ICD-10-CM | POA: Insufficient documentation

## 2015-05-26 DIAGNOSIS — R51 Headache: Secondary | ICD-10-CM | POA: Diagnosis not present

## 2015-05-26 DIAGNOSIS — R112 Nausea with vomiting, unspecified: Secondary | ICD-10-CM | POA: Insufficient documentation

## 2015-05-26 LAB — COMPREHENSIVE METABOLIC PANEL
ALK PHOS: 74 U/L (ref 38–126)
ALT: 23 U/L (ref 14–54)
ANION GAP: 8 (ref 5–15)
AST: 21 U/L (ref 15–41)
Albumin: 3.6 g/dL (ref 3.5–5.0)
BILIRUBIN TOTAL: 0.5 mg/dL (ref 0.3–1.2)
BUN: 16 mg/dL (ref 6–20)
CALCIUM: 9 mg/dL (ref 8.9–10.3)
CO2: 26 mmol/L (ref 22–32)
Chloride: 106 mmol/L (ref 101–111)
Creatinine, Ser: 0.73 mg/dL (ref 0.44–1.00)
GFR calc non Af Amer: >60 mL/min (ref >60)
GLUCOSE: 138 mg/dL — AB (ref 65–99)
Potassium: 3.8 mmol/L (ref 3.5–5.1)
Sodium: 140 mmol/L (ref 135–145)
Total Protein: 7.2 g/dL (ref 6.5–8.1)

## 2015-05-26 LAB — CBC
HCT: 45.6 % (ref 36.0–46.0)
HEMOGLOBIN: 15.1 g/dL — AB (ref 12.0–15.0)
MCH: 30.9 pg (ref 26.0–34.0)
MCHC: 33.1 g/dL (ref 30.0–36.0)
MCV: 93.4 fL (ref 78.0–100.0)
Platelets: 185 10*3/uL (ref 150–400)
RBC: 4.88 MIL/uL (ref 3.87–5.11)
RDW: 12.2 % (ref 11.5–15.5)
WBC: 9.5 10*3/uL (ref 4.0–10.5)

## 2015-05-26 LAB — URINALYSIS, ROUTINE W REFLEX MICROSCOPIC
Glucose, UA: NEGATIVE mg/dL
Ketones, ur: 15 mg/dL — AB
NITRITE: NEGATIVE
Protein, ur: NEGATIVE mg/dL
SPECIFIC GRAVITY, URINE: 1.025 (ref 1.005–1.030)
pH: 7 (ref 5.0–8.0)

## 2015-05-26 LAB — URINE MICROSCOPIC-ADD ON
RBC / HPF: NONE SEEN RBC/hpf (ref 0–5)
WBC UA: NONE SEEN WBC/hpf (ref 0–5)

## 2015-05-26 NOTE — ED Notes (Signed)
Pt. reports multiple emesis with diarrhea , fatigue and generalized body aches onset today . Denies fever / mild chills.

## 2015-05-27 ENCOUNTER — Emergency Department (HOSPITAL_COMMUNITY)
Admission: EM | Admit: 2015-05-27 | Discharge: 2015-05-27 | Disposition: A | Discharge status: Home / Self Care | Payer: Medicare Other | Attending: Emergency Medicine | Admitting: Emergency Medicine

## 2015-05-27 DIAGNOSIS — R1110 Vomiting, unspecified: Secondary | ICD-10-CM

## 2015-05-27 DIAGNOSIS — R112 Nausea with vomiting, unspecified: Secondary | ICD-10-CM | POA: Diagnosis not present

## 2015-05-27 DIAGNOSIS — R197 Diarrhea, unspecified: Secondary | ICD-10-CM

## 2015-05-27 LAB — PROTIME-INR
INR: 1.05 (ref 0.00–1.49)
Prothrombin Time: 14 seconds (ref 11.6–15.2)

## 2015-05-27 MED ORDER — ONDANSETRON 4 MG PO TBDP: ORAL_TABLET | ORAL | Status: DC

## 2015-05-27 MED ORDER — ONDANSETRON HCL 4 MG/2ML IJ SOLN
4.0000 mg | Freq: Once | INTRAMUSCULAR | Status: AC
  Administered 2015-05-27: 4 mg via INTRAVENOUS
  Filled 2015-05-27: qty 2

## 2015-05-27 MED ORDER — SODIUM CHLORIDE 0.9 % IV BOLUS (SEPSIS)
1000.0000 mL | Freq: Once | INTRAVENOUS | Status: AC
Start: 2015-05-27 — End: 2015-05-27
  Administered 2015-05-27: 1000 mL via INTRAVENOUS

## 2015-05-27 NOTE — Discharge Instructions (Signed)

## 2015-05-27 NOTE — ED Notes (Addendum)
Multiple complaints from pts son about wait time. Family member informed about what factors play into the wait time (ex: EMS, Acuity level, longest wait time, holds and the level of care a pt needs). Family member stating that we are placing other people in line before them due to the sons frequent questioning at Nurse First Desk. Family member stating that "people could die out here with this wait time. When an EMS drives by I'm just gonna tell them to let me die [facility]."

## 2015-05-27 NOTE — ED Provider Notes (Signed)
CSN: [id number]     Arrival date & time 05/26/15  2157 History  By signing my name below, I, Marisue Humble, attest that this documentation has been prepared under the direction and in the presence of Loren Racer, MD . Electronically Signed: Marisue Humble, Scribe. 05/27/2015. 1:38 AM.   Chief Complaint  Patient presents with  . Emesis  . Diarrhea    The history is provided by the patient. No language interpreter was used.   HPI Comments:  Shelby Wiley is a 80 y.o. female with PMHx of HTN and TIA who presents to the Emergency Department complaining of over 10 episodes of non-bloody vomiting beginning this afternoon. Pt reports associated episodes of diarrhea while in the waiting room, chills, and headache. Last meal this morning was doughnuts, orange juice and coffee. No alleviating factors noted. Denies sick contacts, bloody stool, abdominal pain, or new leg swelling.   Past Medical History  Diagnosis Date  . Arthritis     PAIN AND OA BOTH KNEES  . Hypertension   . Stroke ([facility])     POSS TIA IN 2012   . Shortness of breath     PT RELATES TO HER WEIGHT-OUT OF SHAPE  . Sleep apnea     STOP BANG SCORE 5   Past Surgical History  Procedure Laterality Date  . Eye surgery      BILATERAL CATARACT EXTRACTION  . Left knee arthroscopy    . Total knee arthroplasty  04/17/2011    Procedure: TOTAL KNEE ARTHROPLASTY;  Surgeon: Loanne Drilling, MD;  Location: [facility];  Service: Orthopedics;  Laterality: Right;   No family history on file. Social History  Substance Use Topics  . Smoking status: Former Smoker -- 0.75 packs/day for 15 years  . Smokeless tobacco: None     Comment: QUIT SMOKING ABOUT 2005 ?  Marland Kitchen Alcohol Use: Yes     Comment: RARELY   OB History    No data available     Review of Systems  Constitutional: Positive for chills. Negative for fever.  Respiratory: Negative for cough and shortness of breath.   Cardiovascular: Negative for chest pain and leg swelling.   Gastrointestinal: Positive for nausea, vomiting and diarrhea. Negative for abdominal pain and blood in stool.  Genitourinary: Negative for dysuria and flank pain.  Musculoskeletal: Negative for myalgias, back pain, neck pain and neck stiffness.  Skin: Negative for rash and wound.  Neurological: Positive for headaches. Negative for dizziness, weakness, light-headedness and numbness.  All other systems reviewed and are negative.   Allergies  Codeine sulfate  Home Medications   Prior to Admission medications   Medication Sig Start Date End Date Taking? Authorizing Provider  acetaminophen (TYLENOL) 325 MG tablet Take 325-650 mg by mouth daily as needed for mild pain.   Yes Historical Provider, MD  amoxicillin (AMOXIL) 500 MG capsule Take 2,000 mg by mouth daily as needed (before dental procedures).   Yes Historical Provider, MD  aspirin 325 MG tablet Take 325 mg by mouth daily as needed for mild pain.   Yes Historical Provider, MD  Carboxymethylcellulose Sodium 0.25 % SOLN Place 3 drops into both eyes 2 (two) times daily as needed (dry eyes).    Yes Historical Provider, MD  hydrochlorothiazide (MICROZIDE) 12.5 MG capsule Take 12.5 mg by mouth at bedtime.   Yes Historical Provider, MD  traMADol (ULTRAM) 50 MG tablet Take 3.125 mg by mouth every 6 (six) hours as needed for moderate pain.   Yes Historical  Provider, MD  ondansetron (ZOFRAN ODT) 4 MG disintegrating tablet 4mg  ODT q4 hours prn nausea/vomit 05/27/15   Loren Raceravid Shirlean Berman, MD   BP 130/75 mmHg  Pulse 92  Temp(Src) 98.2 F (36.8 C) (Oral)  Resp 17  SpO2 98% Physical Exam  Constitutional: She is oriented to person, place, and time. She appears well-developed and well-nourished. No distress.  HENT:  Head: Normocephalic and atraumatic.  Mouth/Throat: Oropharynx is clear and moist. No oropharyngeal exudate.  Eyes: EOM are normal. Pupils are equal, round, and reactive to light.  Neck: Normal range of motion. Neck supple.   Cardiovascular: Normal rate and regular rhythm.   Pulmonary/Chest: Effort normal and breath sounds normal. No respiratory distress. She has no wheezes. She has no rales.  Abdominal: Soft. Bowel sounds are normal. She exhibits no distension and no mass. There is no tenderness. There is no rebound and no guarding.  Musculoskeletal: Normal range of motion. She exhibits no edema or tenderness.  No CVA tenderness bilaterally. No lower extremity swelling or pain.  Neurological: She is alert and oriented to person, place, and time.  Results from these without deficit. Sensation is fully intact.  Skin: Skin is warm and dry. No rash noted. No erythema.  Psychiatric: She has a normal mood and affect. Her behavior is normal.  Nursing note and vitals reviewed.   ED Course  Procedures  DIAGNOSTIC STUDIES:  Oxygen Saturation is 96% on RA, normal by my interpretation.    COORDINATION OF CARE:  1:26 AM Will administer fluids. Discussed treatment plan with pt at bedside and pt agreed to plan.  Labs Review Labs Reviewed  COMPREHENSIVE METABOLIC PANEL - Abnormal; Notable for the following:    Glucose, Bld 138 (*)    All other components within normal limits  CBC - Abnormal; Notable for the following:    Hemoglobin 15.1 (*)    All other components within normal limits  URINALYSIS, ROUTINE W REFLEX MICROSCOPIC (NOT AT [facility]) - Abnormal; Notable for the following:    APPearance CLOUDY (*)    Hgb urine dipstick SMALL (*)    Bilirubin Urine SMALL (*)    Ketones, ur 15 (*)    Leukocytes, UA SMALL (*)    All other components within normal limits  URINE MICROSCOPIC-ADD ON - Abnormal; Notable for the following:    Squamous Epithelial / LPF 6-30 (*)    Bacteria, UA FEW (*)    All other components within normal limits  PROTIME-INR    Imaging Review No results found. I have personally reviewed and evaluated these images and lab results as part of my medical decision-making.   EKG  Interpretation None      MDM   Final diagnoses:  Vomiting and diarrhea    I personally performed the services described in this documentation, which was scribed in my presence. The recorded information has been reviewed and is accurate.   Patient states she is feeling much better after IV fluids. No vomiting or diarrhea in the emergency department. Abdominal exam is benign. Suspect gastroenteritis versus foodborne illness. We'll discharge home to follow-up with her primary physician. Return precautions have been given.   Loren Raceravid Carron Mcmurry, MD 05/27/15 [phone]

## 2015-05-27 NOTE — ED Notes (Signed)
Went to speak with patient regarding complaint from wait times as noted in Laura's note. Pt and family given number to director, ad and patient experience.

## 2015-05-27 NOTE — ED Notes (Signed)
Cup of water provided to patient for PO challenge.

## 2015-12-03 ENCOUNTER — Encounter (HOSPITAL_COMMUNITY): Payer: Self-pay | Admitting: Emergency Medicine

## 2015-12-03 ENCOUNTER — Emergency Department (HOSPITAL_COMMUNITY): Payer: Medicare Other

## 2015-12-03 ENCOUNTER — Emergency Department (HOSPITAL_COMMUNITY)
Admission: EM | Admit: 2015-12-03 | Discharge: 2015-12-04 | Disposition: A | Discharge status: Home / Self Care | Payer: Medicare Other | Attending: Emergency Medicine | Admitting: Emergency Medicine

## 2015-12-03 DIAGNOSIS — Z8673 Personal history of transient ischemic attack (TIA), and cerebral infarction without residual deficits: Secondary | ICD-10-CM | POA: Diagnosis not present

## 2015-12-03 DIAGNOSIS — I10 Essential (primary) hypertension: Secondary | ICD-10-CM | POA: Diagnosis not present

## 2015-12-03 DIAGNOSIS — Z96651 Presence of right artificial knee joint: Secondary | ICD-10-CM | POA: Insufficient documentation

## 2015-12-03 DIAGNOSIS — R51 Headache: Secondary | ICD-10-CM | POA: Insufficient documentation

## 2015-12-03 DIAGNOSIS — Z87891 Personal history of nicotine dependence: Secondary | ICD-10-CM | POA: Insufficient documentation

## 2015-12-03 DIAGNOSIS — Z79899 Other long term (current) drug therapy: Secondary | ICD-10-CM | POA: Insufficient documentation

## 2015-12-03 DIAGNOSIS — R519 Headache, unspecified: Secondary | ICD-10-CM

## 2015-12-03 MED ORDER — DIPHENHYDRAMINE HCL 50 MG/ML IJ SOLN
12.5000 mg | Freq: Once | INTRAMUSCULAR | Status: AC
  Administered 2015-12-04: 12.5 mg via INTRAVENOUS

## 2015-12-03 MED ORDER — METOCLOPRAMIDE HCL 5 MG/ML IJ SOLN
10.0000 mg | Freq: Once | INTRAMUSCULAR | Status: AC
  Administered 2015-12-04: 10 mg via INTRAVENOUS

## 2015-12-03 MED ORDER — FENTANYL CITRATE (PF) 100 MCG/2ML IJ SOLN: 50.0000 ug | Freq: Once | INTRAMUSCULAR | Status: DC

## 2015-12-03 NOTE — ED Triage Notes (Signed)
Per [facility]: Patient to ED from home c/o H/A, N/V. Per EMS, pt was asleep in her chair, woke up and about 15mins later, started to have blurred vision while sitting down, followed by the "worst headache of her life," and nausea/vomiting. Emesis x 2 PTA - given 4mg  zofran by EMS. Hx HTN - unable to remember if she took her medication this morning. EMS VS: 166/93, P 76 NSR, 97% RA, CBG 131. Pt A&O x 4, ambulatory from stretcher to bed.

## 2015-12-03 NOTE — ED Notes (Signed)
Pt. Gone for CT 

## 2015-12-04 ENCOUNTER — Emergency Department (HOSPITAL_COMMUNITY): Payer: Medicare Other

## 2015-12-04 DIAGNOSIS — R51 Headache: Secondary | ICD-10-CM | POA: Diagnosis not present

## 2015-12-04 LAB — BASIC METABOLIC PANEL
Anion gap: 10 (ref 5–15)
BUN: 9 mg/dL (ref 6–20)
CALCIUM: 9.2 mg/dL (ref 8.9–10.3)
CHLORIDE: 103 mmol/L (ref 101–111)
CO2: 25 mmol/L (ref 22–32)
CREATININE: 0.75 mg/dL (ref 0.44–1.00)
GFR calc non Af Amer: >60 mL/min (ref >60)
GLUCOSE: 123 mg/dL — AB (ref 65–99)
Potassium: 3.6 mmol/L (ref 3.5–5.1)
Sodium: 138 mmol/L (ref 135–145)

## 2015-12-04 LAB — CBC WITH DIFFERENTIAL/PLATELET
BASOS PCT: 1 %
Basophils Absolute: 0.1 10*3/uL (ref 0.0–0.1)
EOS PCT: 1 %
Eosinophils Absolute: 0.1 10*3/uL (ref 0.0–0.7)
HEMATOCRIT: 43.3 % (ref 36.0–46.0)
HEMOGLOBIN: 14.6 g/dL (ref 12.0–15.0)
LYMPHS PCT: 12 %
Lymphs Abs: 1.2 10*3/uL (ref 0.7–4.0)
MCH: 31.6 pg (ref 26.0–34.0)
MCHC: 33.7 g/dL (ref 30.0–36.0)
MCV: 93.7 fL (ref 78.0–100.0)
MONOS PCT: 6 %
Monocytes Absolute: 0.6 10*3/uL (ref 0.1–1.0)
NEUTROS ABS: 8.4 10*3/uL — AB (ref 1.7–7.7)
NEUTROS PCT: 80 %
Platelets: 201 10*3/uL (ref 150–400)
RBC: 4.62 MIL/uL (ref 3.87–5.11)
RDW: 12.2 % (ref 11.5–15.5)
WBC: 10.4 10*3/uL (ref 4.0–10.5)

## 2015-12-04 LAB — PROTIME-INR
INR: 1
Prothrombin Time: 13.2 seconds (ref 11.4–15.2)

## 2015-12-04 LAB — TROPONIN I: Troponin I: <0.03 ng/mL (ref <0.03)

## 2015-12-04 MED ORDER — DEXAMETHASONE SODIUM PHOSPHATE 10 MG/ML IJ SOLN
10.0000 mg | Freq: Once | INTRAMUSCULAR | Status: AC
  Administered 2015-12-04: 10 mg via INTRAVENOUS
  Filled 2015-12-04: qty 1

## 2015-12-04 MED ORDER — MORPHINE SULFATE (PF) 4 MG/ML IV SOLN: 2.0000 mg | Freq: Once | INTRAVENOUS | Status: DC

## 2015-12-04 MED ORDER — IOPAMIDOL (ISOVUE-370) INJECTION 76%
50.0000 mL | Freq: Once | INTRAVENOUS | Status: AC | PRN
  Administered 2015-12-04: 50 mL via INTRAVENOUS

## 2015-12-04 NOTE — ED Notes (Signed)
Pt taken to CT.

## 2015-12-04 NOTE — ED Provider Notes (Signed)
Pt with h/o HTN, CVA, and sleep apnea, "pounding" 10/10 headache onset last night ~10 pm with associated blurred vision, nausea, and vomiting. She was asleep in a chair and woke up 15 minutes afterwards with her noticeably blurred vision. Pt notes the rest of her symptoms followed not long after. She was asymptomatic prior but reports she had not ate much yesterday. She states that her blurred vision resolved PTA but notes her headache and other symptoms are more persistent. Denies h/o similar symptoms. She is ambulatory with a cane. Pt is not on blood thinners. She denies generalized weakness, photophobia, chest pain, dizziness, shortness of breath, speech difficulty, trouble swallowing, neck pain. After given medications in the ED en route with EMS, pt states her symptoms have significantly improved; however, she still has a headache at this time. Head CT unremarkable.   Pt heart regular rate and rhythm. No temporal artery tenderness. CN 2-12 intact, no ataxia on finger to nose, no nystagmus, 5/5 strength throughout, no pronator drift, Romberg not tested, gait not tested.   CT head negative within six hours of onset of sudden onset headache.  Doubt SAH.  Doubt temporal arteritis. Doubt meningitis.    nonfocal neuro exam.  Headache and BP improving with treatment in the ED.  I personally performed the services described in this documentation, which was scribed in my presence. The recorded information has been reviewed and is accurate.    Glynn OctaveStephen Jorma Tassinari, MD 12/04/15 [phone]

## 2015-12-04 NOTE — ED Notes (Signed)
Pt ambulatory to bedside commode with steady gait  

## 2015-12-04 NOTE — ED Provider Notes (Signed)
[facility]PT Provider Note   CSN: [id number] Arrival date & time: 12/03/15  2233     History   Chief Complaint Chief Complaint  Patient presents with  . Headache    HPI Ricardo JerichoLinda F Stepp is a 80 y.o. female.  Patient with a history of HTN presents with sudden onset severe headache with nausea and vomiting this afternoon. She woke up from a nap and noticed bilateral blurry vision that has since cleared. The headache pain started soon after she woke. No fever, recent head trauma. No history of headaches in the past. The pain is located in bilateral frontal areas. No neck pain.    The history is provided by the patient. No language interpreter was used.    Past Medical History:  Diagnosis Date  . Arthritis    PAIN AND OA BOTH KNEES  . Hypertension   . Shortness of breath    PT RELATES TO HER WEIGHT-OUT OF SHAPE  . Sleep apnea    STOP BANG SCORE 5  . Stroke (HCC)    POSS TIA IN 2012     Patient Active Problem List   Diagnosis Date Noted  . Postop Hypokalemia 04/20/2011  . OA (osteoarthritis) of knee 04/17/2011  . TIA 11/02/2009  . HYPERGLYCEMIA, BORDERLINE 07/20/2009  . EYE FLOATERS, LEFT 07/02/2009  . FATIGUE 07/02/2009  . WART, VIRAL 11/20/2008  . Vitreous degeneration 11/20/2008  . HEMORRHOIDS, INTERNAL 06/25/2008  . EXTERNAL HEMORRHOIDS 06/25/2008  . KERATOSIS PILARIS 06/25/2008  . HYPERTENSION, BORDERLINE 02/26/2007  . SHOULDER PAIN, RIGHT 02/26/2007  . OBESITY, MORBID 05/05/2006  . INSUFFICIENCY, VENOUS NOS 05/05/2006  . OSTEOARTHRITIS 05/05/2006  . DIVERTICULOSIS, COLON 12/08/2003    Past Surgical History:  Procedure Laterality Date  . EYE SURGERY     BILATERAL CATARACT EXTRACTION  . LEFT KNEE ARTHROSCOPY    . TOTAL KNEE ARTHROPLASTY  04/17/2011   Procedure: TOTAL KNEE ARTHROPLASTY;  Surgeon: Loanne DrillingFrank V Aluisio, MD;  Location: [facility];  Service: Orthopedics;  Laterality: Right;    OB History    No data available       Home Medications     Prior to Admission medications   Medication Sig Start Date End Date Taking? Authorizing Provider  amoxicillin (AMOXIL) 500 MG capsule Take 2,000 mg by mouth daily as needed (before dental procedures).   Yes Historical Provider, MD  hydrochlorothiazide (MICROZIDE) 12.5 MG capsule Take 12.5 mg by mouth at bedtime.   Yes Historical Provider, MD  ondansetron (ZOFRAN ODT) 4 MG disintegrating tablet 4mg  ODT q4 hours prn nausea/vomit Patient not taking: Reported on 12/03/2015 05/27/15   Loren Raceravid Yelverton, MD    Family History No family history on file.  Social History Social History  Substance Use Topics  . Smoking status: Former Smoker    Packs/day: 0.75    Years: 15.00  . Smokeless tobacco: Never Used     Comment: QUIT SMOKING ABOUT 2005 ?  Marland Kitchen. Alcohol use Yes     Comment: RARELY     Allergies   Codeine sulfate   Review of Systems Review of Systems  Constitutional: Negative for chills and fever.  HENT: Negative.  Negative for sinus pain.   Eyes: Positive for visual disturbance (Blurry vision - resolved).  Respiratory: Negative.  Negative for shortness of breath.   Cardiovascular: Negative.  Negative for chest pain and leg swelling.  Gastrointestinal: Positive for nausea and vomiting. Negative for abdominal pain.  Genitourinary: Negative.   Musculoskeletal: Negative.  Negative for neck pain and neck  stiffness.  Skin: Negative.   Neurological: Positive for headaches. Negative for weakness.     Physical Exam Updated Vital Signs BP 160/86 (BP Location: Right Arm)   Pulse 73   Temp 97.6 F (36.4 C)   Resp 18   SpO2 97%   Physical Exam  Constitutional: She is oriented to person, place, and time. She appears well-developed and well-nourished.  HENT:  Head: Normocephalic and atraumatic.  Eyes: Conjunctivae are normal. Pupils are equal, round, and reactive to light.  Neck: Normal range of motion. Neck supple.  Cardiovascular: Normal rate and regular rhythm.   No murmur  heard. Pulmonary/Chest: Effort normal and breath sounds normal. She has no wheezes. She has no rales.  Abdominal: Soft. Bowel sounds are normal. There is no tenderness. There is no rebound and no guarding.  Musculoskeletal: Normal range of motion.  Neurological: She is alert and oriented to person, place, and time. She has normal strength and normal reflexes. No sensory deficit. She displays a negative Romberg sign. Coordination normal.  Coordination normal by finger to nose. Speech clear and focused. CN's 3-12 grossly intact. No facial asymmetry.  Skin: Skin is warm and dry. No rash noted.  Psychiatric: She has a normal mood and affect.     ED Treatments / Results  Labs (all labs ordered are listed, but only abnormal results are displayed) Labs Reviewed  CBC WITH DIFFERENTIAL/PLATELET - Abnormal; Notable for the following:       Result Value   Neutro Abs 8.4 (*)    All other components within normal limits  BASIC METABOLIC PANEL - Abnormal; Notable for the following:    Glucose, Bld 123 (*)    All other components within normal limits  PROTIME-INR  TROPONIN I    EKG  EKG Interpretation None       Radiology Ct Head Wo Contrast  Result Date: 12/03/2015 CLINICAL DATA:  Initial evaluation for sudden onset headache. EXAM: CT HEAD WITHOUT CONTRAST TECHNIQUE: Contiguous axial images were obtained from the base of the skull through the vertex without intravenous contrast. COMPARISON:  Prior CT and MRI from 09/25/09. FINDINGS: Brain: Age-related cerebral atrophy with mild chronic microvascular ischemic disease. No acute intracranial hemorrhage. No evidence for acute large vessel territory infarct. No mass lesion, midline shift, or mass effect. No hydrocephalus. No extra-axial fluid collection. Vascular: No hyperdense vessel. Scattered vascular calcifications noted within the carotid siphons. Skull: Scalp soft tissues within normal limits.  Calvarium intact. Sinuses/Orbits: Globes and  orbital soft tissues within normal limits. Mild scattered mucosal thickening within the ethmoidal air cells. Paranasal sinuses are otherwise clear. Trace right mastoid effusion, of doubtful clinical significance. Left mastoid air cells are clear. IMPRESSION: 1. No acute intracranial process. 2. Mild age-related cerebral atrophy with chronic microvascular ischemic disease. Electronically Signed   By: Rise Mu M.D.   On: 12/03/2015 23:25    Procedures Procedures (including critical care time)  Medications Ordered in ED Medications  metoCLOPramide (REGLAN) injection 10 mg (10 mg Intravenous Given 12/04/15 0002)  diphenhydrAMINE (BENADRYL) injection 12.5 mg (12.5 mg Intravenous Given 12/04/15 0002)     Initial Impression / Assessment and Plan / ED Course  I have reviewed the triage vital signs and the nursing notes.  Pertinent labs & imaging results that were available during my care of the patient were reviewed by me and considered in my medical decision making (see chart for details).  Clinical Course    Patient presents EMS with severe, sudden onset headache that is  bilateral, frontal, and associated with nausea and vomiting. No fever.   CT scan ordered immediately upon arrival and is negative for acute bleed. The patient is examined by Dr. Manus Gunningancour who advises CTA head and neck, IV headache cocktail. CTS's are completely negative. Headache cocktail relieves headache pain.   The patient ambulates with assistance (baseline). No further nausea or vomiting. Do not suspect meningitis, intracranial bleed, temporal arteritis.   Patient is felt appropriate for discharge home and PCP follow up. Return precautions discussed.   Final Clinical Impressions(s) / ED Diagnoses   Final diagnoses:  None    New Prescriptions New Prescriptions   No medications on file     Elpidio AnisShari Gizelle Whetsel, Cordelia Poche-C 12/04/15 [phone]    Glynn OctaveStephen Rancour, MD 12/04/15 [phone]

## 2015-12-04 NOTE — ED Notes (Signed)
Meal tray at bedside.  

## 2017-02-07 ENCOUNTER — Other Ambulatory Visit: Payer: Self-pay | Admitting: Cardiology

## 2017-02-07 ENCOUNTER — Ambulatory Visit (HOSPITAL_COMMUNITY)
Admission: RE | Admit: 2017-02-07 | Discharge: 2017-02-07 | Disposition: A | Discharge status: Home / Self Care | Payer: Medicare Other | Source: Ambulatory Visit | Attending: Vascular Surgery | Admitting: Vascular Surgery

## 2017-02-07 DIAGNOSIS — I872 Venous insufficiency (chronic) (peripheral): Secondary | ICD-10-CM | POA: Diagnosis present

## 2017-02-07 DIAGNOSIS — R600 Localized edema: Secondary | ICD-10-CM | POA: Diagnosis not present

## 2017-02-12 ENCOUNTER — Encounter (HOSPITAL_COMMUNITY): Payer: Medicare Other

## 2017-09-17 ENCOUNTER — Ambulatory Visit: Payer: Medicare Other | Admitting: Rehabilitation

## 2017-09-27 ENCOUNTER — Ambulatory Visit: Payer: Medicare Other

## 2017-10-05 ENCOUNTER — Ambulatory Visit: Payer: Medicare Other | Attending: Family Medicine | Admitting: Physical Therapy

## 2018-09-24 ENCOUNTER — Encounter: Payer: Self-pay | Admitting: Cardiovascular Disease

## 2018-10-23 NOTE — Progress Notes (Deleted)
Cardiology Office Note:   Date:  10/23/2018  NAME:  Shelby Wiley    MRN: [id number] DOB:  1935-05-01   PCP:  Aretta Nip, MD  Cardiologist:  Evalina Field, MD  Electrophysiologist:  None   Referring MD: Aretta Nip, MD   No chief complaint on file. ***  History of Present Illness:   TAHRA Shelby Wiley is a 83 y.o. female with a hx of morbid obesity, venous insufficiency, lymphedema, HTN, aortic stenosis who is being seen today for the evaluation of aortic stenosis at the request of Rankins, Bill Salinas, MD.  An echocardiogram was obtained in 02/2017 that showed EF 55% moderate aortic stenosis (Vmax 3 m/s, MG 20.3 mmHG, AVA 1.09 cm2), pulmonary hypertension, moderate to severe TR.   Past Medical History: Past Medical History:  Diagnosis Date  . Arthritis    PAIN AND OA BOTH KNEES  . Hypertension   . Shortness of breath    PT RELATES TO HER WEIGHT-OUT OF SHAPE  . Sleep apnea    STOP BANG SCORE 5  . Stroke ([place])    POSS TIA IN 2012     Past Surgical History: Past Surgical History:  Procedure Laterality Date  . EYE SURGERY     BILATERAL CATARACT EXTRACTION  . LEFT KNEE ARTHROSCOPY    . TOTAL KNEE ARTHROPLASTY  04/17/2011   Procedure: TOTAL KNEE ARTHROPLASTY;  Surgeon: Gearlean Alf, MD;  Location: [facility];  Service: Orthopedics;  Laterality: Right;    Current Medications: No outpatient medications have been marked as taking for the 10/24/18 encounter (Appointment) with O'Neal, Cassie Freer, MD.     Allergies:    Codeine sulfate   Social History: Social History   Socioeconomic History  . Marital status: Divorced    Spouse name: Not on file  . Number of children: Not on file  . Years of education: Not on file  . Highest education level: Not on file  Occupational History  . Not on file  Social Needs  . Financial resource strain: Not on file  . Food insecurity    Worry: Not on file    Inability: Not on file  . Transportation needs    Medical:  Not on file    Non-medical: Not on file  Tobacco Use  . Smoking status: Former Smoker    Packs/day: 0.75    Years: 15.00    Pack years: 11.25  . Smokeless tobacco: Never Used  . Tobacco comment: QUIT SMOKING ABOUT 2005 ?  Substance and Sexual Activity  . Alcohol use: Yes    Comment: RARELY  . Drug use: No  . Sexual activity: Not on file  Lifestyle  . Physical activity    Days per week: Not on file    Minutes per session: Not on file  . Stress: Not on file  Relationships  . Social Herbalist on phone: Not on file    Gets together: Not on file    Attends religious service: Not on file    Active member of club or organization: Not on file    Attends meetings of clubs or organizations: Not on file    Relationship status: Not on file  Other Topics Concern  . Not on file  Social History Narrative  . Not on file     Family History: The patient's ***family history is not on file.  ROS:   All other ROS reviewed and negative. Pertinent positives noted in the  HPI.     EKGs/Labs/Other Studies Reviewed:   The following studies were personally reviewed by me today:  EKG:  EKG is *** ordered today.  The ekg ordered today demonstrates ***, and was personally reviewed by me.   Recent Labs: No results found for requested labs within last 8760 hours.   Recent Lipid Panel    Component Value Date/Time   CHOL  09/25/2009 0856    172        ATP III CLASSIFICATION:  <200     mg/dL   Desirable  161-096200-239  mg/dL   Borderline High  >=045>=240    mg/dL   High          TRIG [id number] 09/25/2009 0856   HDL 39 (L) 09/25/2009 0856   CHOLHDL 4.4 09/25/2009 0856   VLDL 29 09/25/2009 0856   LDLCALC (H) 09/25/2009 0856    104        Total Cholesterol/HDL:CHD Risk Coronary Heart Disease Risk Table                     Men   Women  1/2 Average Risk   3.4   3.3  Average Risk       5.0   4.4  2 X Average Risk   9.6   7.1  3 X Average Risk  23.4   11.0        Use the calculated Patient  Ratio above and the CHD Risk Table to determine the patient's CHD Risk.        ATP III CLASSIFICATION (LDL):  <100     mg/dL   Optimal  [id number]  mg/dL   Near or Above                    Optimal  130-159  mg/dL   Borderline  [id number]  mg/dL   High  >213>190     mg/dL   Very High    Physical Exam:   VS:  There were no vitals taken for this visit.   Wt Readings from Last 3 Encounters:  07/13/11 280 lb (127 kg)  04/17/11 288 lb 4.8 oz (130.8 kg)  04/06/11 288 lb 4.8 oz (130.8 kg)    General: Well nourished, well developed, in no acute distress Heart: Atraumatic, normal size  Eyes: PEERLA, EOMI  Neck: Supple, no JVD Endocrine: No thryomegaly Cardiac: Normal S1, S2; RRR; no murmurs, rubs, or gallops Lungs: Clear to auscultation bilaterally, no wheezing, rhonchi or rales  Abd: Soft, nontender, no hepatomegaly  Ext: No edema, pulses 2+ Musculoskeletal: No deformities, BUE and BLE strength normal and equal Skin: Warm and dry, no rashes   Neuro: Alert and oriented to person, place, time, and situation, CNII-XII grossly intact, no focal deficits  Psych: Normal mood and affect   ASSESSMENT:   Shelby Wiley is a 83 y.o. female who presents for the following: No diagnosis found.  PLAN:   There are no diagnoses linked to this encounter.  Disposition: No follow-ups on file.  Medication Adjustments/Labs and Tests Ordered: Current medicines are reviewed at length with the patient today.  Concerns regarding medicines are outlined above.  No orders of the defined types were placed in this encounter.  No orders of the defined types were placed in this encounter.   There are no Patient Instructions on file for this visit.   Signed, Lenna GilfordWesley T. Flora Lipps'Neal, MD [facility]  [facility]  [address], [address]  [place], [place] [address] [phone]  10/23/2018 8:21 PM

## 2018-10-24 ENCOUNTER — Ambulatory Visit: Payer: Medicare Other | Admitting: Cardiovascular Disease

## 2019-03-21 ENCOUNTER — Ambulatory Visit: Payer: PRIVATE HEALTH INSURANCE | Admitting: Podiatry

## 2019-05-27 ENCOUNTER — Other Ambulatory Visit: Payer: Self-pay

## 2019-05-27 ENCOUNTER — Encounter: Payer: Self-pay | Admitting: Podiatry

## 2019-05-27 ENCOUNTER — Ambulatory Visit: Payer: Medicare Other | Admitting: Podiatry

## 2019-05-27 ENCOUNTER — Ambulatory Visit (INDEPENDENT_AMBULATORY_CARE_PROVIDER_SITE_OTHER): Payer: Medicare Other | Admitting: Podiatry

## 2019-05-27 DIAGNOSIS — B351 Tinea unguium: Secondary | ICD-10-CM

## 2019-05-27 DIAGNOSIS — I890 Lymphedema, not elsewhere classified: Secondary | ICD-10-CM | POA: Insufficient documentation

## 2019-05-27 DIAGNOSIS — I872 Venous insufficiency (chronic) (peripheral): Secondary | ICD-10-CM | POA: Diagnosis not present

## 2019-05-27 DIAGNOSIS — M79674 Pain in right toe(s): Secondary | ICD-10-CM

## 2019-05-27 DIAGNOSIS — M79675 Pain in left toe(s): Secondary | ICD-10-CM | POA: Diagnosis not present

## 2019-05-27 NOTE — Progress Notes (Signed)
This patient presents  to my office for at risk foot care.  This patient requires this care by a professional since this patient will be at risk due to having venous insufficiency.  Patient is wearing unna boors  B/L.  This patient is unable to cut nails herself since the patient cannot reach her nails.These nails are painful walking and wearing shoes.  This patient presents for at risk foot care today.  General Appearance  Alert, conversant and in no acute stress.  Vascular  Deferred due to unna boots  Neurologic  Deferred due to unna boots   Nails Thick disfigured discolored nails with subungual debris  from hallux to fifth toes bilaterally. No evidence of bacterial infection or drainage bilaterally.  Orthopedic  No limitations of motion  feet .  No crepitus or effusions noted.  No bony pathology or digital deformities noted.  Skin  normotropic skin with no porokeratosis noted bilaterally.  No signs of infections or ulcers noted.     Onychomycosis  Pain in right toes  Pain in left toes  Consent was obtained for treatment procedures.   Mechanical debridement of nails 1-5  bilaterally performed with a nail nipper.  Filed with dremel without incident.    Return office visit  10 weeks                   Told patient to return for periodic foot care and evaluation due to potential at risk complications.   Helane Gunther DPM

## 2019-08-13 ENCOUNTER — Ambulatory Visit (INDEPENDENT_AMBULATORY_CARE_PROVIDER_SITE_OTHER): Payer: Medicare Other | Admitting: Podiatry

## 2019-08-13 ENCOUNTER — Other Ambulatory Visit: Payer: Self-pay

## 2019-08-13 ENCOUNTER — Encounter: Payer: Self-pay | Admitting: Podiatry

## 2019-08-13 DIAGNOSIS — B351 Tinea unguium: Secondary | ICD-10-CM

## 2019-08-13 DIAGNOSIS — M79674 Pain in right toe(s): Secondary | ICD-10-CM

## 2019-08-13 DIAGNOSIS — I872 Venous insufficiency (chronic) (peripheral): Secondary | ICD-10-CM | POA: Diagnosis not present

## 2019-08-13 DIAGNOSIS — I890 Lymphedema, not elsewhere classified: Secondary | ICD-10-CM

## 2019-08-13 DIAGNOSIS — M79675 Pain in left toe(s): Secondary | ICD-10-CM | POA: Diagnosis not present

## 2019-08-13 NOTE — Progress Notes (Signed)
This patient presents  to my office for at risk foot care.  This patient requires this care by a professional since this patient will be at risk due to having venous insufficiency.  Patient is wearing unna boors  B/L.  This patient is unable to cut nails herself since the patient cannot reach her nails.These nails are painful walking and wearing shoes.  This patient presents for at risk foot care today.  General Appearance  Alert, conversant and in no acute stress.  Vascular  Deferred due to unna boots  Neurologic  Deferred due to unna boots   Nails Thick disfigured discolored nails with subungual debris  from hallux to fifth toes bilaterally. No evidence of bacterial infection or drainage bilaterally.  Orthopedic  No limitations of motion  feet .  No crepitus or effusions noted.  No bony pathology or digital deformities noted.  Skin  normotropic skin with no porokeratosis noted bilaterally.  No signs of infections or ulcers noted.     Onychomycosis  Pain in right toes  Pain in left toes  Consent was obtained for treatment procedures.   Mechanical debridement of nails 1-5  bilaterally performed with a nail nipper.  Filed with dremel without incident. Patient talked continually above my suction and drill being uses.   Return office visit  10 weeks                   Told patient to return for periodic foot care and evaluation due to potential at risk complications.   Helane Gunther DPM

## 2019-11-07 ENCOUNTER — Ambulatory Visit (INDEPENDENT_AMBULATORY_CARE_PROVIDER_SITE_OTHER): Payer: Medicare Other | Admitting: Podiatry

## 2019-11-07 ENCOUNTER — Encounter: Payer: Self-pay | Admitting: Podiatry

## 2019-11-07 ENCOUNTER — Other Ambulatory Visit: Payer: Self-pay

## 2019-11-07 DIAGNOSIS — M79674 Pain in right toe(s): Secondary | ICD-10-CM

## 2019-11-07 DIAGNOSIS — M79675 Pain in left toe(s): Secondary | ICD-10-CM | POA: Diagnosis not present

## 2019-11-07 DIAGNOSIS — B351 Tinea unguium: Secondary | ICD-10-CM | POA: Diagnosis not present

## 2019-11-07 DIAGNOSIS — I872 Venous insufficiency (chronic) (peripheral): Secondary | ICD-10-CM | POA: Diagnosis not present

## 2019-11-07 DIAGNOSIS — I890 Lymphedema, not elsewhere classified: Secondary | ICD-10-CM | POA: Diagnosis not present

## 2019-11-07 NOTE — Progress Notes (Signed)
This patient presents  to my office for at risk foot care.  This patient requires this care by a professional since this patient will be at risk due to having venous insufficiency.  Patient is wearing unna boors  B/L.  This patient is unable to cut nails herself since the patient cannot reach her nails.These nails are painful walking and wearing shoes.  This patient presents for at risk foot care today.  General Appearance  Alert, conversant and in no acute stress.  Vascular  Deferred due to unna boots  Neurologic  Deferred due to unna boots   Nails Thick disfigured discolored nails with subungual debris  from hallux to fifth toes bilaterally. No evidence of bacterial infection or drainage bilaterally.  Orthopedic  No limitations of motion  feet .  No crepitus or effusions noted.  No bony pathology or digital deformities noted.  Skin  normotropic skin with no porokeratosis noted bilaterally.  No signs of infections or ulcers noted.     Onychomycosis  Pain in right toes  Pain in left toes  Consent was obtained for treatment procedures.   Mechanical debridement of nails 1-5  bilaterally performed with a nail nipper.  Filed with dremel without incident. Patient talked continually above my suction and drill being uses.   Return office visit  10 weeks                   Told patient to return for periodic foot care and evaluation due to potential at risk complications.   Helane Gunther DPM

## 2020-01-23 ENCOUNTER — Ambulatory Visit: Payer: Medicare Other | Admitting: Podiatry

## 2020-04-01 ENCOUNTER — Ambulatory Visit: Payer: Medicare Other | Admitting: Podiatry

## 2020-04-06 ENCOUNTER — Ambulatory Visit: Payer: Medicare Other | Admitting: Podiatry

## 2020-04-13 ENCOUNTER — Ambulatory Visit (INDEPENDENT_AMBULATORY_CARE_PROVIDER_SITE_OTHER): Payer: Medicare Other | Admitting: Podiatry

## 2020-04-13 ENCOUNTER — Other Ambulatory Visit: Payer: Self-pay

## 2020-04-13 DIAGNOSIS — I872 Venous insufficiency (chronic) (peripheral): Secondary | ICD-10-CM

## 2020-04-13 DIAGNOSIS — M79674 Pain in right toe(s): Secondary | ICD-10-CM | POA: Diagnosis not present

## 2020-04-13 DIAGNOSIS — B351 Tinea unguium: Secondary | ICD-10-CM | POA: Diagnosis not present

## 2020-04-13 DIAGNOSIS — M79675 Pain in left toe(s): Secondary | ICD-10-CM | POA: Diagnosis not present

## 2020-04-13 DIAGNOSIS — I890 Lymphedema, not elsewhere classified: Secondary | ICD-10-CM

## 2020-04-18 NOTE — Progress Notes (Signed)
  Subjective:  Patient ID: [name], female    DOB: 09/25/1935,  MRN: [id number]  Chief Complaint  Patient presents with  . Nail Problem    Thick painful toenails    85 y.o. female presents with the above complaint. History confirmed with patient.   Objective:  Physical Exam: Legs are wrapped for lymphedema treatment.  She has thickened elongated nail plates G95 with yellow-brown discoloration, capillary refill is brisk y Assessment:   1. Pain due to onychomycosis of toenails of both feet   2. Venous (peripheral) insufficiency   3. Lymphedema      Plan:  Patient was evaluated and treated and all questions answered.  Patient educated on diabetes. Discussed proper diabetic foot care and discussed risks and complications of disease. Educated patient in depth on reasons to return to the office immediately should he/she discover anything concerning or new on the feet. All questions answered. Discussed proper shoes as well.   Discussed the etiology and treatment options for the condition in detail with the patient. Educated patient on the topical and oral treatment options for mycotic nails. Recommended debridement of the nails today. Sharp and mechanical debridement performed of all painful and mycotic nails today. Nails debrided in length and thickness using a nail nipper to level of comfort. Discussed treatment options including appropriate shoe gear. Follow up as needed for painful nails.   Return in about 3 months (around 07/13/2020) for at risk diabetic foot care.

## 2020-07-13 ENCOUNTER — Other Ambulatory Visit: Payer: Self-pay

## 2020-07-13 ENCOUNTER — Ambulatory Visit (INDEPENDENT_AMBULATORY_CARE_PROVIDER_SITE_OTHER): Payer: Medicare Other | Admitting: Podiatry

## 2020-07-13 ENCOUNTER — Encounter: Payer: Self-pay | Admitting: Podiatry

## 2020-07-13 DIAGNOSIS — B351 Tinea unguium: Secondary | ICD-10-CM | POA: Diagnosis not present

## 2020-07-13 DIAGNOSIS — I890 Lymphedema, not elsewhere classified: Secondary | ICD-10-CM

## 2020-07-13 DIAGNOSIS — M79675 Pain in left toe(s): Secondary | ICD-10-CM | POA: Diagnosis not present

## 2020-07-13 DIAGNOSIS — I872 Venous insufficiency (chronic) (peripheral): Secondary | ICD-10-CM

## 2020-07-13 DIAGNOSIS — M79674 Pain in right toe(s): Secondary | ICD-10-CM | POA: Diagnosis not present

## 2020-07-13 NOTE — Progress Notes (Signed)
This patient presents  to my office for at risk foot care.  This patient requires this care by a professional since this patient will be at risk due to having venous insufficiency.  Patient is wearing unna boors  B/L.  This patient is unable to cut nails herself since the patient cannot reach her nails.These nails are painful walking and wearing shoes.  This patient presents for at risk foot care today.  General Appearance  Alert, conversant and in no acute stress.  Vascular  Deferred due to unna boots  Neurologic  Deferred due to unna boots   Nails Thick disfigured discolored nails with subungual debris  from hallux to fifth toes bilaterally. No evidence of bacterial infection or drainage bilaterally.  Orthopedic  No limitations of motion  feet .  No crepitus or effusions noted.  No bony pathology or digital deformities noted.  Skin  normotropic skin with no porokeratosis noted bilaterally.  No signs of infections or ulcers noted.     Onychomycosis  Pain in right toes  Pain in left toes  Consent was obtained for treatment procedures.   Mechanical debridement of nails 1-5  bilaterally performed with a nail nipper.  Filed with dremel without incident. Patient talked continually above my suction and drill being uses.   Return office visit  12   weeks                   Told patient to return for periodic foot care and evaluation due to potential at risk complications.   Helane Gunther DPM

## 2020-10-18 ENCOUNTER — Other Ambulatory Visit: Payer: Self-pay

## 2020-10-18 ENCOUNTER — Ambulatory Visit (INDEPENDENT_AMBULATORY_CARE_PROVIDER_SITE_OTHER): Payer: Medicare Other | Admitting: Podiatry

## 2020-10-18 ENCOUNTER — Encounter: Payer: Self-pay | Admitting: Podiatry

## 2020-10-18 DIAGNOSIS — M79675 Pain in left toe(s): Secondary | ICD-10-CM | POA: Diagnosis not present

## 2020-10-18 DIAGNOSIS — I890 Lymphedema, not elsewhere classified: Secondary | ICD-10-CM

## 2020-10-18 DIAGNOSIS — M79674 Pain in right toe(s): Secondary | ICD-10-CM

## 2020-10-18 DIAGNOSIS — I872 Venous insufficiency (chronic) (peripheral): Secondary | ICD-10-CM

## 2020-10-18 DIAGNOSIS — B351 Tinea unguium: Secondary | ICD-10-CM | POA: Diagnosis not present

## 2020-10-18 NOTE — Progress Notes (Signed)
This patient presents  to my office for at risk foot care.  This patient requires this care by a professional since this patient will be at risk due to having venous insufficiency.  Patient is wearing unna boors  B/L.  This patient is unable to cut nails herself since the patient cannot reach her nails.These nails are painful walking and wearing shoes.  This patient presents for at risk foot care today.  General Appearance  Alert, conversant and in no acute stress.  Vascular  Deferred due to unna boots  Neurologic  Deferred due to unna boots   Nails Thick disfigured discolored nails with subungual debris  from hallux to fifth toes bilaterally. No evidence of bacterial infection or drainage bilaterally.  Orthopedic  No limitations of motion  feet .  No crepitus or effusions noted.  No bony pathology or digital deformities noted.  Skin  normotropic skin with no porokeratosis noted bilaterally.  No signs of infections or ulcers noted.     Onychomycosis  Pain in right toes  Pain in left toes  Consent was obtained for treatment procedures.   Mechanical debridement of nails 1-5  bilaterally performed with a nail nipper.  Filed with dremel without incident. Patient talked continually above my suction and drill being uses.   Return office visit  12   weeks                   Told patient to return for periodic foot care and evaluation due to potential at risk complications.   Bralin Garry DPM   

## 2020-11-17 ENCOUNTER — Encounter (HOSPITAL_COMMUNITY): Payer: Self-pay | Admitting: Emergency Medicine

## 2020-11-17 ENCOUNTER — Emergency Department (HOSPITAL_COMMUNITY)
Admission: EM | Admit: 2020-11-17 | Discharge: 2020-11-18 | Disposition: A | Discharge status: Home / Self Care | Payer: Medicare Other | Attending: Emergency Medicine | Admitting: Emergency Medicine

## 2020-11-17 ENCOUNTER — Emergency Department (HOSPITAL_COMMUNITY): Payer: Medicare Other

## 2020-11-17 DIAGNOSIS — Y92009 Unspecified place in unspecified non-institutional (private) residence as the place of occurrence of the external cause: Secondary | ICD-10-CM | POA: Diagnosis not present

## 2020-11-17 DIAGNOSIS — S0990XA Unspecified injury of head, initial encounter: Secondary | ICD-10-CM | POA: Diagnosis present

## 2020-11-17 DIAGNOSIS — Z87891 Personal history of nicotine dependence: Secondary | ICD-10-CM | POA: Diagnosis not present

## 2020-11-17 DIAGNOSIS — W01198A Fall on same level from slipping, tripping and stumbling with subsequent striking against other object, initial encounter: Secondary | ICD-10-CM | POA: Insufficient documentation

## 2020-11-17 DIAGNOSIS — W19XXXA Unspecified fall, initial encounter: Secondary | ICD-10-CM

## 2020-11-17 DIAGNOSIS — I10 Essential (primary) hypertension: Secondary | ICD-10-CM | POA: Insufficient documentation

## 2020-11-17 DIAGNOSIS — Z79899 Other long term (current) drug therapy: Secondary | ICD-10-CM | POA: Diagnosis not present

## 2020-11-17 DIAGNOSIS — Z23 Encounter for immunization: Secondary | ICD-10-CM | POA: Diagnosis not present

## 2020-11-17 DIAGNOSIS — S0101XA Laceration without foreign body of scalp, initial encounter: Secondary | ICD-10-CM | POA: Insufficient documentation

## 2020-11-17 DIAGNOSIS — Z96651 Presence of right artificial knee joint: Secondary | ICD-10-CM | POA: Insufficient documentation

## 2020-11-17 MED ORDER — TETANUS-DIPHTH-ACELL PERTUSSIS 5-2.5-18.5 LF-MCG/0.5 IM SUSY
0.5000 mL | PREFILLED_SYRINGE | Freq: Once | INTRAMUSCULAR | Status: AC
  Administered 2020-11-18: 0.5 mL via INTRAMUSCULAR
  Filled 2020-11-17: qty 0.5

## 2020-11-17 NOTE — ED Provider Notes (Signed)
[facility] DEPT Provider Note   CSN: [id number] Arrival date & time: 11/17/20  1943     History Chief Complaint  Patient presents with   Fall    Patient had mechanical fall at home with on loss of consciousness. She tripped over feet at home outside and fell forward and hit her head on the edge of the brick stairs. No neck or back pain. Patient denies blood thinners. Bleeding from forehead stopped before EMS arrived. Per EMS, patient has anxiety and has no family support. Per EMS, neighbor came to help her after the fall.     Shelby Wiley is a 85 y.o. female.  Patient tripped and fell outside and hit her head on the concrete no loss of consciousness.  The history is provided by the patient and medical records.  Fall This is a new problem. The current episode started 6 to 12 hours ago. The problem occurs rarely. The problem has been resolved. Pertinent negatives include no chest pain, no abdominal pain and no headaches. Nothing aggravates the symptoms. Nothing relieves the symptoms. She has tried nothing for the symptoms. The treatment provided no relief.      Past Medical History:  Diagnosis Date   Arthritis    PAIN AND OA BOTH KNEES   Hypertension    Shortness of breath    PT RELATES TO HER WEIGHT-OUT OF SHAPE   Sleep apnea    STOP BANG SCORE 5   Stroke ([place])    POSS TIA IN 2012     Patient Active Problem List   Diagnosis Date Noted   Pain due to onychomycosis of toenails of both feet 05/27/2019   Venous (peripheral) insufficiency 05/27/2019   Lymphedema 05/27/2019   Postop Hypokalemia 04/20/2011   OA (osteoarthritis) of knee 04/17/2011   TIA 11/02/2009   HYPERGLYCEMIA, BORDERLINE 07/20/2009   EYE FLOATERS, LEFT 07/02/2009   FATIGUE 07/02/2009   WART, VIRAL 11/20/2008   Vitreous degeneration 11/20/2008   HEMORRHOIDS, INTERNAL 06/25/2008   EXTERNAL HEMORRHOIDS 06/25/2008   KERATOSIS PILARIS 06/25/2008   HYPERTENSION, BORDERLINE  02/26/2007   SHOULDER PAIN, RIGHT 02/26/2007   OBESITY, MORBID 05/05/2006   INSUFFICIENCY, VENOUS NOS 05/05/2006   OSTEOARTHRITIS 05/05/2006   DIVERTICULOSIS, COLON 12/08/2003    Past Surgical History:  Procedure Laterality Date   EYE SURGERY     BILATERAL CATARACT EXTRACTION   LEFT KNEE ARTHROSCOPY     TOTAL KNEE ARTHROPLASTY  04/17/2011   Procedure: TOTAL KNEE ARTHROPLASTY;  Surgeon: Gearlean Alf, MD;  Location: [facility] ORS;  Service: Orthopedics;  Laterality: Right;     OB History   No obstetric history on file.     No family history on file.  Social History   Tobacco Use   Smoking status: Former    Packs/day: 0.75    Years: 15.00    Pack years: 11.25    Types: Cigarettes   Smokeless tobacco: Never   Tobacco comments:    QUIT SMOKING ABOUT 2005 ?  Substance Use Topics   Alcohol use: Yes    Comment: RARELY   Drug use: No    Home Medications Prior to Admission medications   Medication Sig Start Date End Date Taking? Authorizing Provider  amoxicillin (AMOXIL) 500 MG capsule Take 2,000 mg by mouth daily as needed (before dental procedures).    [provider]  furosemide (LASIX) 40 MG tablet Take 40 mg by mouth.    [provider]  hydrochlorothiazide (MICROZIDE) 12.5 MG capsule  Take 12.5 mg by mouth at bedtime.    [provider]  ondansetron (ZOFRAN ODT) 4 MG disintegrating tablet 4mg  ODT q4 hours prn nausea/vomit 05/27/15   05/29/15, MD    Allergies    Codeine sulfate  Review of Systems   Review of Systems  Constitutional:  Negative for appetite change and fatigue.  HENT:  Negative for congestion, ear discharge and sinus pressure.   Eyes:  Negative for discharge.  Respiratory:  Negative for cough.   Cardiovascular:  Negative for chest pain.  Gastrointestinal:  Negative for abdominal pain and diarrhea.  Genitourinary:  Negative for frequency and hematuria.  Musculoskeletal:  Negative for back pain.       Headache  Skin:   Negative for rash.  Neurological:  Negative for seizures and headaches.  Psychiatric/Behavioral:  Negative for hallucinations.    Physical Exam Updated Vital Signs BP (!) 141/69 (BP Location: Right Arm)   Pulse 91   Temp 98.1 F (36.7 C) (Oral)   Resp 17   SpO2 100%   Physical Exam Vitals and nursing note reviewed.  Constitutional:      Appearance: She is well-developed.  HENT:     Head: Normocephalic.     Comments: 3 cm superficial laceration to anterior scalp    Nose: Nose normal.  Eyes:     General: No scleral icterus.    Conjunctiva/sclera: Conjunctivae normal.  Neck:     Thyroid: No thyromegaly.  Cardiovascular:     Rate and Rhythm: Normal rate and regular rhythm.     Heart sounds: No murmur heard.   No friction rub. No gallop.  Pulmonary:     Breath sounds: No stridor. No wheezing or rales.  Chest:     Chest wall: No tenderness.  Abdominal:     General: There is no distension.     Tenderness: There is no abdominal tenderness. There is no rebound.  Musculoskeletal:        General: Normal range of motion.     Cervical back: Neck supple.  Lymphadenopathy:     Cervical: No cervical adenopathy.  Skin:    Findings: No erythema or rash.  Neurological:     Mental Status: She is alert and oriented to person, place, and time.     Motor: No abnormal muscle tone.     Coordination: Coordination normal.  Psychiatric:        Behavior: Behavior normal.    ED Results / Procedures / Treatments   Labs (all labs ordered are listed, but only abnormal results are displayed) Labs Reviewed - No data to display  EKG None  Radiology CT Head Wo Contrast  Result Date: 11/17/2020 CLINICAL DATA:  Fall. EXAM: CT HEAD WITHOUT CONTRAST CT CERVICAL SPINE WITHOUT CONTRAST TECHNIQUE: Multidetector CT imaging of the head and cervical spine was performed following the standard protocol without intravenous contrast. Multiplanar CT image reconstructions of the cervical spine were also  generated. COMPARISON:  CT head 12/03/2015. FINDINGS: CT HEAD FINDINGS Brain: No evidence of acute infarction, hemorrhage, hydrocephalus, extra-axial collection or mass lesion/mass effect. Again seen is mild diffuse atrophy and mild periventricular white matter hypodensity, likely chronic small vessel ischemic change. Vascular: No hyperdense vessel or unexpected calcification. Skull: Normal. Negative for fracture or focal lesion. Sinuses/Orbits: No acute finding. Other: None. CT CERVICAL SPINE FINDINGS Alignment: There is 2 mm of anterolisthesis at C2-C3 and C4-C5 which is likely degenerative. Alignment is otherwise anatomic. Skull base and vertebrae: No acute fracture. No primary bone  lesion or focal pathologic process. The bones are osteopenic. Soft tissues and spinal canal: No prevertebral fluid or swelling. No visible canal hematoma. Disc levels: There is severe disc space narrowing and endplate osteophyte formation throughout the cervical spine compatible with degenerative change. There is bone-on-bone configuration at C5-C6. Facet arthropathy is seen most significant on the right at C3-C4 and C4-C5 causing mild neural foraminal stenosis. No significant central canal stenosis at any level. Upper chest: Negative. Other: None. IMPRESSION: 1.  No acute intracranial process. 2. No acute fracture or traumatic subluxation of the cervical spine. 3. Stable mild diffuse brain atrophy and mild chronic small vessel ischemic change. 4. Moderate severe degenerative changes throughout the cervical spine. Electronically Signed   By: Ronney Asters M.D.   On: 11/17/2020 20:32   CT Cervical Spine Wo Contrast  Result Date: 11/17/2020 CLINICAL DATA:  Fall. EXAM: CT HEAD WITHOUT CONTRAST CT CERVICAL SPINE WITHOUT CONTRAST TECHNIQUE: Multidetector CT imaging of the head and cervical spine was performed following the standard protocol without intravenous contrast. Multiplanar CT image reconstructions of the cervical spine were  also generated. COMPARISON:  CT head 12/03/2015. FINDINGS: CT HEAD FINDINGS Brain: No evidence of acute infarction, hemorrhage, hydrocephalus, extra-axial collection or mass lesion/mass effect. Again seen is mild diffuse atrophy and mild periventricular white matter hypodensity, likely chronic small vessel ischemic change. Vascular: No hyperdense vessel or unexpected calcification. Skull: Normal. Negative for fracture or focal lesion. Sinuses/Orbits: No acute finding. Other: None. CT CERVICAL SPINE FINDINGS Alignment: There is 2 mm of anterolisthesis at C2-C3 and C4-C5 which is likely degenerative. Alignment is otherwise anatomic. Skull base and vertebrae: No acute fracture. No primary bone lesion or focal pathologic process. The bones are osteopenic. Soft tissues and spinal canal: No prevertebral fluid or swelling. No visible canal hematoma. Disc levels: There is severe disc space narrowing and endplate osteophyte formation throughout the cervical spine compatible with degenerative change. There is bone-on-bone configuration at C5-C6. Facet arthropathy is seen most significant on the right at C3-C4 and C4-C5 causing mild neural foraminal stenosis. No significant central canal stenosis at any level. Upper chest: Negative. Other: None. IMPRESSION: 1.  No acute intracranial process. 2. No acute fracture or traumatic subluxation of the cervical spine. 3. Stable mild diffuse brain atrophy and mild chronic small vessel ischemic change. 4. Moderate severe degenerative changes throughout the cervical spine. Electronically Signed   By: Ronney Asters M.D.   On: 11/17/2020 20:32    Procedures .Marland KitchenLaceration Repair  Date/Time: 11/17/2020 8:49 PM Performed by: Milton Ferguson, MD Authorized by: Milton Ferguson, MD   Comments:     Patient has a 3 cm laceration to her scalp.  The area was cleaned thoroughly with Betadine.  No anesthesia was used.  6 staples were used to close the wound.  Patient tolerated the procedure well    Medications Ordered in ED Medications  Tdap (BOOSTRIX) injection 0.5 mL (has no administration in time range)    ED Course  I have reviewed the triage vital signs and the nursing notes.  Pertinent labs & imaging results that were available during my care of the patient were reviewed by me and considered in my medical decision making (see chart for details).    MDM Rules/Calculators/A&P                           Fall with laceration to scalp.  3 cm laceration that was closed with 6 staples.  CT of  head neck negative.  Patient alert and oriented and will be discharged home and have staples removed in 1 week Final Clinical Impression(s) / ED Diagnoses Final diagnoses:  Fall, initial encounter    Rx / DC Orders ED Discharge Orders     None        Milton Ferguson, MD 11/17/20 2049

## 2020-11-17 NOTE — Discharge Instructions (Signed)
Clean laceration twice a day with soap and water gently.  Take Tylenol or Motrin for pain.  Follow-up in 1 week to get your staples removed.  You can go to your family doctor or an urgent care.

## 2020-11-18 DIAGNOSIS — S0101XA Laceration without foreign body of scalp, initial encounter: Secondary | ICD-10-CM | POA: Diagnosis not present

## 2021-01-24 ENCOUNTER — Ambulatory Visit: Payer: Medicare Other | Admitting: Podiatry

## 2021-01-26 ENCOUNTER — Encounter: Payer: Self-pay | Admitting: Podiatry

## 2021-01-26 ENCOUNTER — Other Ambulatory Visit: Payer: Self-pay

## 2021-01-26 ENCOUNTER — Ambulatory Visit (INDEPENDENT_AMBULATORY_CARE_PROVIDER_SITE_OTHER): Payer: Medicare Other | Admitting: Podiatry

## 2021-01-26 DIAGNOSIS — M79675 Pain in left toe(s): Secondary | ICD-10-CM | POA: Diagnosis not present

## 2021-01-26 DIAGNOSIS — B351 Tinea unguium: Secondary | ICD-10-CM | POA: Diagnosis not present

## 2021-01-26 DIAGNOSIS — I890 Lymphedema, not elsewhere classified: Secondary | ICD-10-CM

## 2021-01-26 DIAGNOSIS — M79674 Pain in right toe(s): Secondary | ICD-10-CM

## 2021-01-26 DIAGNOSIS — I872 Venous insufficiency (chronic) (peripheral): Secondary | ICD-10-CM

## 2021-01-26 NOTE — Progress Notes (Signed)
This patient presents  to my office for at risk foot care.  This patient requires this care by a professional since this patient will be at risk due to having venous insufficiency.  Patient is wearing unna boors  B/L.  This patient is unable to cut nails herself since the patient cannot reach her nails.These nails are painful walking and wearing shoes.  This patient presents for at risk foot care today.  General Appearance  Alert, conversant and in no acute stress.  Vascular  Deferred due to unna boots  Neurologic  Deferred due to unna boots   Nails Thick disfigured discolored nails with subungual debris  from hallux to fifth toes bilaterally. No evidence of bacterial infection or drainage bilaterally.  Orthopedic  No limitations of motion  feet .  No crepitus or effusions noted.  No bony pathology or digital deformities noted.  Skin  normotropic skin with no porokeratosis noted bilaterally.  No signs of infections or ulcers noted.     Onychomycosis  Pain in right toes  Pain in left toes  Consent was obtained for treatment procedures.   Mechanical debridement of nails 1-5  bilaterally performed with a nail nipper.  Filed with dremel without incident.    Return office visit  12   weeks                   Told patient to return for periodic foot care and evaluation due to potential at risk complications.   Helane Gunther DPM

## 2021-04-27 ENCOUNTER — Ambulatory Visit: Payer: Medicare Other | Admitting: Podiatry

## 2021-12-09 ENCOUNTER — Encounter: Payer: Self-pay | Admitting: Podiatry

## 2021-12-09 ENCOUNTER — Ambulatory Visit (INDEPENDENT_AMBULATORY_CARE_PROVIDER_SITE_OTHER): Payer: Medicare Other | Admitting: Podiatry

## 2021-12-09 VITALS — BP 134/72

## 2021-12-09 DIAGNOSIS — B353 Tinea pedis: Secondary | ICD-10-CM

## 2021-12-09 DIAGNOSIS — M79674 Pain in right toe(s): Secondary | ICD-10-CM

## 2021-12-09 DIAGNOSIS — L602 Onychogryphosis: Secondary | ICD-10-CM

## 2021-12-09 DIAGNOSIS — M79675 Pain in left toe(s): Secondary | ICD-10-CM | POA: Diagnosis not present

## 2021-12-09 DIAGNOSIS — B351 Tinea unguium: Secondary | ICD-10-CM | POA: Diagnosis not present

## 2021-12-09 MED ORDER — KETOCONAZOLE 2 % EX CREA: TOPICAL_CREAM | CUTANEOUS | 1 refills | Status: DC

## 2021-12-09 NOTE — Patient Instructions (Addendum)
Purchase a long handled sponge at [facility] and clean your feet. Also get one to apply medication to your feet.  Athlete's Foot Athlete's foot (tinea pedis) is a fungal infection of the skin on your feet. It often occurs on the skin that is between or underneath the toes. It can also occur on the soles of your feet. The infection can spread from person to person (is contagious). It can also spread when a person's bare feet come in contact with the fungus on shower floors or on items such as shoes. What are the causes? This condition is caused by a fungus that grows in warm, moist places. You can get athlete's foot by sharing shoes, shower stalls, towels, and wet floors with someone who is infected. Not washing your feet or changing your socks often enough can also lead to athlete's foot. What increases the risk? This condition is more likely to develop in: Men. People who have a weak body defense system (immune system). People who have diabetes. People who use public showers, such as at a gym. People who wear heavy-duty shoes, such as Environmental manager. Seasons with warm, humid weather. What are the signs or symptoms? Symptoms of this condition include: Itchy areas between your toes or on the soles of your feet. White, flaky, or scaly areas between your toes or on the soles of your feet. Very itchy small blisters between your toes or on the soles of your feet. Small cuts in your skin. These cuts can become infected. Thick or discolored toenails. How is this diagnosed? This condition may be diagnosed with a physical exam and a review of your medical history. Your health care provider may also take a skin or toenail sample to examine under a microscope. How is this treated? This condition is treated with antifungal medicines. These may be applied as powders, ointments, or creams. In severe cases, an oral antifungal medicine may be given. Follow these instructions at  home: Medicines Apply or take over-the-counter and prescription medicines only as told by your health care provider. Apply your antifungal medicine as told by your health care provider. Do not stop using the antifungal even if your condition improves. Foot care Do not scratch your feet. Keep your feet dry: Wear cotton or wool socks. Change your socks every day or if they become wet. Wear shoes that allow air to flow, such as sandals or canvas tennis shoes. Wash and dry your feet, including the area between your toes. Also, wash and dry your feet: Every day or as told by your health care provider. After exercising. General instructions Do not let others use towels, shoes, nail clippers, or other personal items that touch your feet. Protect your feet by wearing sandals in wet areas, such as locker rooms and shared showers. Keep all follow-up visits. This is important. If you have diabetes, keep your blood sugar under control. Contact a health care provider if: You have a fever. You have swelling, soreness, warmth, or redness in your foot. Your feet are not getting better with treatment. Your symptoms get worse. You have new symptoms. You have severe pain. Summary Athlete's foot (tinea pedis) is a fungal infection of the skin on your feet. It often occurs on skin that is between or underneath the toes. This condition is caused by a fungus that grows in warm, moist places. Symptoms include white, flaky, or scaly areas between your toes or on the soles of your feet. This condition is treated with  antifungal medicines. Keep your feet clean. Always dry them thoroughly. This information is not intended to replace advice given to you by your health care provider. Make sure you discuss any questions you have with your health care provider. Document Revised: 04/11/2020 Document Reviewed: 04/11/2020 Elsevier Patient Education  2023 [employer].

## 2021-12-09 NOTE — Progress Notes (Signed)
  Subjective:  Patient ID: Shelby Wiley, female    DOB: May 11, 1935,  MRN: [id number]  DEETYA Wiley presents to clinic today for painful elongated mycotic toenails 1-5 bilaterally which are tender when wearing enclosed shoe gear. Pain is relieved with periodic professional debridement.  Chief Complaint  Patient presents with   Nail Problem    Routine foot care PCP-[facility]   New problem(s): None.   PCP is Rankins, Turkey R, MD.  Allergies  Allergen Reactions   Codeine Nausea And Vomiting and Other (See Comments)    Caused GI upset   Codeine Sulfate Nausea And Vomiting and Other (See Comments)    Caused GI upset    Review of Systems: Negative except as noted in the HPI.  Objective: No changes noted in today's physical examination. Vitals:   12/09/21 1034  BP: 134/72   JACLYN Wiley is a pleasant 86 y.o. female morbidly obese in NAD. AAO x 3.  Neurovascular examination deferred due to bilateral below knee unna boots in place. Unna boots are clean, dry and intact. She has normal capillary refill time to all 10 toes and digits are warm.  Dermatological Examination: Severely overgrown, onychogryphotic toenails 1-5 b/l. No hyperkeratotic nor porokeratotic lesions present on today's visit. Pedal skin noted to exhibit signs of poor pedal hygiene with noted foot odor b/l and interdigital debris. No open wounds noted. Diffuse scaling noted peripherally and plantarly b/l feet.  No interdigital macerations.  No blisters, no weeping. No signs of secondary bacterial infection noted.  Musculoskeletal Examination: Muscle strength 5/5 to all lower extremity muscle groups bilaterally. HAV with bunion deformity noted b/l LE. Utilizes walker for ambulation assistance.  Radiographs: None  Assessment/Plan: 1. Pain due to onychomycosis of toenails of both feet   2. Onychogryphosis   3. Tinea pedis of both feet     Meds ordered this encounter  Medications   ketoconazole (NIZORAL) 2 %  cream    Sig: Apply to both feet and between toes once daily for 6 weeks.    Dispense:  60 g    Refill:  1   -Patient was evaluated and treated. All patient's and/or POA's questions/concerns answered on today's visit. -Patient to continue soft, supportive shoe gear daily. -Mycotic toenails 1-5 bilaterally were debrided in length and girth with sterile nail nippers and dremel without incident. -For tinea pedis, Rx sent to pharmacy for Ketoconazole Cream 2% to be applied once daily for six weeks. -Patient/POA to call should there be question/concern in the interim.   Return in about 3 months (around 03/10/2022).  Freddie Breech, DPM

## 2022-03-27 ENCOUNTER — Ambulatory Visit: Payer: Medicare Other | Admitting: Podiatry

## 2022-04-20 ENCOUNTER — Other Ambulatory Visit (HOSPITAL_COMMUNITY): Payer: Self-pay | Admitting: Family Medicine

## 2022-04-20 DIAGNOSIS — I272 Pulmonary hypertension, unspecified: Secondary | ICD-10-CM

## 2022-05-05 ENCOUNTER — Ambulatory Visit (HOSPITAL_COMMUNITY): Payer: Medicare Other

## 2022-05-09 ENCOUNTER — Encounter: Payer: Self-pay | Admitting: Podiatry

## 2022-05-09 ENCOUNTER — Ambulatory Visit (INDEPENDENT_AMBULATORY_CARE_PROVIDER_SITE_OTHER): Payer: Medicare Other | Admitting: Podiatry

## 2022-05-09 DIAGNOSIS — M79674 Pain in right toe(s): Secondary | ICD-10-CM

## 2022-05-09 DIAGNOSIS — M79675 Pain in left toe(s): Secondary | ICD-10-CM

## 2022-05-09 DIAGNOSIS — I890 Lymphedema, not elsewhere classified: Secondary | ICD-10-CM

## 2022-05-09 DIAGNOSIS — B351 Tinea unguium: Secondary | ICD-10-CM

## 2022-05-09 NOTE — Progress Notes (Signed)
This patient presents  to my office for at risk foot care.  This patient requires this care by a professional since this patient will be at risk due to having venous insufficiency.  Patient is wearing unna boors  B/L.  This patient is unable to cut nails herself since the patient cannot reach her nails.These nails are painful walking and wearing shoes.  This patient presents for at risk foot care today. ° °General Appearance  Alert, conversant and in no acute stress. ° °Vascular  Deferred due to unna boots ° °Neurologic  Deferred due to unna boots  ° °Nails Thick disfigured discolored nails with subungual debris  from hallux to fifth toes bilaterally. No evidence of bacterial infection or drainage bilaterally. ° °Orthopedic  No limitations of motion  feet .  No crepitus or effusions noted.  No bony pathology or digital deformities noted. ° °Skin  normotropic skin with no porokeratosis noted bilaterally.  No signs of infections or ulcers noted.    ° °Onychomycosis  Pain in right toes  Pain in left toes ° °Consent was obtained for treatment procedures.   Mechanical debridement of nails 1-5  bilaterally performed with a nail nipper.  Filed with dremel without incident.  ° ° °Return office visit  12   weeks                   Told patient to return for periodic foot care and evaluation due to potential at risk complications. ° ° °Aislinn Feliz DPM  ° °

## 2022-05-22 ENCOUNTER — Encounter (HOSPITAL_COMMUNITY): Payer: Self-pay

## 2022-05-22 ENCOUNTER — Ambulatory Visit (HOSPITAL_COMMUNITY): Payer: Medicare Other

## 2022-07-20 ENCOUNTER — Ambulatory Visit (HOSPITAL_COMMUNITY): Payer: Medicare Other

## 2022-08-09 ENCOUNTER — Ambulatory Visit: Payer: Medicare Other | Admitting: Podiatry

## 2022-08-16 ENCOUNTER — Ambulatory Visit: Payer: Medicare Other | Admitting: Podiatry

## 2022-09-28 ENCOUNTER — Encounter (HOSPITAL_COMMUNITY): Payer: Self-pay

## 2022-09-28 ENCOUNTER — Inpatient Hospital Stay (HOSPITAL_COMMUNITY)
Admission: EM | Admit: 2022-09-28 | Discharge: 2022-10-03 | DRG: 603 | Disposition: A | Discharge status: Skilled Nursing Facility | Payer: Medicare Other | Source: Ambulatory Visit | Attending: Internal Medicine | Admitting: Internal Medicine

## 2022-09-28 ENCOUNTER — Emergency Department (HOSPITAL_COMMUNITY): Payer: Medicare Other

## 2022-09-28 ENCOUNTER — Other Ambulatory Visit: Payer: Self-pay

## 2022-09-28 DIAGNOSIS — Z602 Problems related to living alone: Secondary | ICD-10-CM | POA: Diagnosis present

## 2022-09-28 DIAGNOSIS — Z885 Allergy status to narcotic agent status: Secondary | ICD-10-CM

## 2022-09-28 DIAGNOSIS — I890 Lymphedema, not elsewhere classified: Principal | ICD-10-CM | POA: Diagnosis present

## 2022-09-28 DIAGNOSIS — I10 Essential (primary) hypertension: Secondary | ICD-10-CM | POA: Diagnosis present

## 2022-09-28 DIAGNOSIS — M170 Bilateral primary osteoarthritis of knee: Secondary | ICD-10-CM | POA: Diagnosis present

## 2022-09-28 DIAGNOSIS — Z96651 Presence of right artificial knee joint: Secondary | ICD-10-CM | POA: Diagnosis present

## 2022-09-28 DIAGNOSIS — Z751 Person awaiting admission to adequate facility elsewhere: Secondary | ICD-10-CM | POA: Diagnosis not present

## 2022-09-28 DIAGNOSIS — M79606 Pain in leg, unspecified: Secondary | ICD-10-CM | POA: Diagnosis not present

## 2022-09-28 DIAGNOSIS — I872 Venous insufficiency (chronic) (peripheral): Secondary | ICD-10-CM | POA: Diagnosis present

## 2022-09-28 DIAGNOSIS — Z87891 Personal history of nicotine dependence: Secondary | ICD-10-CM

## 2022-09-28 DIAGNOSIS — Z8673 Personal history of transient ischemic attack (TIA), and cerebral infarction without residual deficits: Secondary | ICD-10-CM | POA: Diagnosis not present

## 2022-09-28 DIAGNOSIS — Z6841 Body Mass Index (BMI) 40.0 and over, adult: Secondary | ICD-10-CM | POA: Diagnosis not present

## 2022-09-28 DIAGNOSIS — Z79899 Other long term (current) drug therapy: Secondary | ICD-10-CM

## 2022-09-28 DIAGNOSIS — I350 Nonrheumatic aortic (valve) stenosis: Secondary | ICD-10-CM | POA: Diagnosis present

## 2022-09-28 DIAGNOSIS — I480 Paroxysmal atrial fibrillation: Secondary | ICD-10-CM

## 2022-09-28 DIAGNOSIS — E876 Hypokalemia: Secondary | ICD-10-CM | POA: Diagnosis present

## 2022-09-28 DIAGNOSIS — I4891 Unspecified atrial fibrillation: Secondary | ICD-10-CM | POA: Diagnosis present

## 2022-09-28 DIAGNOSIS — L03116 Cellulitis of left lower limb: Principal | ICD-10-CM | POA: Diagnosis present

## 2022-09-28 DIAGNOSIS — L03115 Cellulitis of right lower limb: Secondary | ICD-10-CM | POA: Diagnosis present

## 2022-09-28 DIAGNOSIS — L0390 Cellulitis, unspecified: Secondary | ICD-10-CM | POA: Diagnosis present

## 2022-09-28 DIAGNOSIS — E66813 Obesity, class 3: Secondary | ICD-10-CM | POA: Diagnosis present

## 2022-09-28 DIAGNOSIS — Z91199 Patient's noncompliance with other medical treatment and regimen due to unspecified reason: Secondary | ICD-10-CM | POA: Diagnosis not present

## 2022-09-28 LAB — COMPREHENSIVE METABOLIC PANEL
ALT: 7 U/L (ref 0–44)
AST: 11 U/L — ABNORMAL LOW (ref 15–41)
Albumin: 3.2 g/dL — ABNORMAL LOW (ref 3.5–5.0)
Alkaline Phosphatase: 83 U/L (ref 38–126)
Anion gap: 9 (ref 5–15)
BUN: 11 mg/dL (ref 8–23)
CO2: 29 mmol/L (ref 22–32)
Calcium: 8.6 mg/dL — ABNORMAL LOW (ref 8.9–10.3)
Chloride: 101 mmol/L (ref 98–111)
Creatinine, Ser: 0.68 mg/dL (ref 0.44–1.00)
GFR, Estimated: >60 mL/min (ref >60)
Glucose, Bld: 109 mg/dL — ABNORMAL HIGH (ref 70–99)
Potassium: 3.4 mmol/L — ABNORMAL LOW (ref 3.5–5.1)
Sodium: 139 mmol/L (ref 135–145)
Total Bilirubin: 1 mg/dL (ref 0.3–1.2)
Total Protein: 6.9 g/dL (ref 6.5–8.1)

## 2022-09-28 LAB — CBC WITH DIFFERENTIAL/PLATELET
Abs Immature Granulocytes: 0.03 10*3/uL (ref 0.00–0.07)
Basophils Absolute: 0.1 10*3/uL (ref 0.0–0.1)
Basophils Relative: 1 %
Eosinophils Absolute: 0.2 10*3/uL (ref 0.0–0.5)
Eosinophils Relative: 2 %
HCT: 38.6 % (ref 36.0–46.0)
Hemoglobin: 12.4 g/dL (ref 12.0–15.0)
Immature Granulocytes: 0 %
Lymphocytes Relative: 17 %
Lymphs Abs: 1.7 10*3/uL (ref 0.7–4.0)
MCH: 31.3 pg (ref 26.0–34.0)
MCHC: 32.1 g/dL (ref 30.0–36.0)
MCV: 97.5 fL (ref 80.0–100.0)
Monocytes Absolute: 0.9 10*3/uL (ref 0.1–1.0)
Monocytes Relative: 9 %
Neutro Abs: 7.4 10*3/uL (ref 1.7–7.7)
Neutrophils Relative %: 71 %
Platelets: 220 10*3/uL (ref 150–400)
RBC: 3.96 MIL/uL (ref 3.87–5.11)
RDW: 12.5 % (ref 11.5–15.5)
WBC: 10.3 10*3/uL (ref 4.0–10.5)
nRBC: 0 % (ref 0.0–0.2)

## 2022-09-28 LAB — I-STAT CG4 LACTIC ACID, ED
Lactic Acid, Venous: 0.6 mmol/L (ref 0.5–1.9)
Lactic Acid, Venous: 1.2 mmol/L (ref 0.5–1.9)

## 2022-09-28 LAB — PROCALCITONIN: Procalcitonin: <0.1 ng/mL

## 2022-09-28 LAB — CK: Total CK: 34 U/L — ABNORMAL LOW (ref 38–234)

## 2022-09-28 LAB — MAGNESIUM: Magnesium: 2.2 mg/dL (ref 1.7–2.4)

## 2022-09-28 LAB — PHOSPHORUS: Phosphorus: 2.9 mg/dL (ref 2.5–4.6)

## 2022-09-28 MED ORDER — POTASSIUM CHLORIDE CRYS ER 20 MEQ PO TBCR
40.0000 meq | EXTENDED_RELEASE_TABLET | Freq: Once | ORAL | Status: AC
  Administered 2022-09-28: 40 meq via ORAL
  Filled 2022-09-28: qty 2

## 2022-09-28 MED ORDER — SODIUM CHLORIDE 0.9 % IV SOLN
2.0000 g | Freq: Once | INTRAVENOUS | Status: AC
  Administered 2022-09-29: 2 g via INTRAVENOUS
  Filled 2022-09-28: qty 12.5

## 2022-09-28 MED ORDER — VANCOMYCIN HCL IN DEXTROSE 1-5 GM/200ML-% IV SOLN
1000.0000 mg | Freq: Once | INTRAVENOUS | Status: AC
  Administered 2022-09-28: 1000 mg via INTRAVENOUS
  Filled 2022-09-28: qty 200

## 2022-09-28 NOTE — ED Triage Notes (Signed)
Pt BIBA from doctor's office with c/o bleeding from legs. Noncompliant with medications or appointments. Supposed to use wound vac but has not. Hx lipidemia.   126/76 HR 92 98% RA  CBG 110

## 2022-09-28 NOTE — ED Provider Notes (Signed)
[place] [facility] AT [facility] Provider Note   CSN: [id number] Arrival date & time: 09/28/22  1510     History  Chief Complaint  Patient presents with   Wound Infection    Shelby Wiley is a 87 y.o. female.  HPI Patient with history of lymphedema.  Has Unna boots on.  Sent from new PCP for the wounds.  Reportedly has been wearing the boots for at least a month or 2.  Patient states he is not sure when they got put on.  States [facility] had been managing them but they switched suppliers so she stopped seeing him has had these on.  Went to do PCP and was sent in.  States they usually are not draining like this but started to bleed and drain at the office today.  States she is worried she is going to lose the leg discuss that with the doctor at the clinic told her.  She denies fevers and denies chills.   Past Medical History:  Diagnosis Date   Arthritis    PAIN AND OA BOTH KNEES   Hypertension    Shortness of breath    PT RELATES TO HER WEIGHT-OUT OF SHAPE   Sleep apnea    STOP BANG SCORE 5   Stroke (HCC)    POSS TIA IN 2012   '  Home Medications Prior to Admission medications   Medication Sig Start Date End Date Taking? Authorizing Provider  amoxicillin (AMOXIL) 500 MG capsule Take 2,000 mg by mouth See admin instructions. Take 2,000 mg by mouth one hour prior to dental appointments.    [provider]  aspirin 325 MG EC tablet Take 325 mg by mouth 3 (three) times a week.    [provider]  furosemide (LASIX) 40 MG tablet Take 40 mg by mouth daily.    [provider]  hydrochlorothiazide (MICROZIDE) 12.5 MG capsule Take 12.5 mg by mouth at bedtime. Patient not taking: Reported on 11/17/2020    [provider]  ketoconazole (NIZORAL) 2 % cream Apply to both feet and between toes once daily for 6 weeks. 12/09/21   Freddie Breech, DPM  ondansetron (ZOFRAN ODT) 4 MG disintegrating tablet 4mg  ODT  q4 hours prn nausea/vomit Patient not taking: Reported on 11/17/2020 05/27/15   Loren Racer, MD      Allergies    Codeine and Codeine sulfate    Review of Systems   Review of Systems  Physical Exam Updated Vital Signs BP 124/65   Pulse 77   Temp 97.9 F (36.6 C) (Oral)   Resp 18   SpO2 95%  Physical Exam Vitals and nursing note reviewed.  Cardiovascular:     Rate and Rhythm: Regular rhythm.  Abdominal:     Tenderness: There is no abdominal tenderness.  Musculoskeletal:     Comments: Lymphedema bilateral lower extremities.  Foul-smelling Unna boots in place bilaterally.  More drainage from left lower leg.  Sensation intact bilateral feet.  Able to move with both extremities at the ankles.  Skin:    General: Skin is warm.  Neurological:     Mental Status: She is alert.             ED Results / Procedures / Treatments   Labs (all labs ordered are listed, but only abnormal results are displayed) Labs Reviewed  COMPREHENSIVE METABOLIC PANEL - Abnormal; Notable for the following components:      Result Value  Potassium 3.4 (*)    Glucose, Bld 109 (*)    Calcium 8.6 (*)    Albumin 3.2 (*)    AST 11 (*)    All other components within normal limits  CBC WITH DIFFERENTIAL/PLATELET  I-STAT CG4 LACTIC ACID, ED  I-STAT CG4 LACTIC ACID, ED    EKG None  Radiology DG Tibia/Fibula Right  Result Date: 09/28/2022 CLINICAL DATA:  Infection, bleeding from legs. Best obtainable images EXAM: RIGHT TIBIA AND FIBULA - 2 VIEW; RIGHT FOOT COMPLETE - 3+ VIEW COMPARISON:  Radiographs 07/08/2009 FINDINGS: Right TKA. No acute fracture or dislocation. Diffuse soft tissue swelling about the right lower extremity and foot. Overlying gauze/bandage material obscures detail at the ankle and midfoot. No definite osteomyelitis. IMPRESSION: Diffuse soft tissue swelling about the right lower extremity and foot. No definite osteomyelitis. Electronically Signed   By: Minerva Fester M.D.    On: 09/28/2022 20:07   DG Foot Complete Right  Result Date: 09/28/2022 CLINICAL DATA:  Infection, bleeding from legs. Best obtainable images EXAM: RIGHT TIBIA AND FIBULA - 2 VIEW; RIGHT FOOT COMPLETE - 3+ VIEW COMPARISON:  Radiographs 07/08/2009 FINDINGS: Right TKA. No acute fracture or dislocation. Diffuse soft tissue swelling about the right lower extremity and foot. Overlying gauze/bandage material obscures detail at the ankle and midfoot. No definite osteomyelitis. IMPRESSION: Diffuse soft tissue swelling about the right lower extremity and foot. No definite osteomyelitis. Electronically Signed   By: Minerva Fester M.D.   On: 09/28/2022 20:07   DG Tibia/Fibula Left  Result Date: 09/28/2022 CLINICAL DATA:  Bleeding from legs.  Infection. EXAM: LEFT TIBIA AND FIBULA - 2 VIEW; LEFT FOOT - COMPLETE 3+ VIEW COMPARISON:  None Available. FINDINGS: Advanced arthritis left knee with near complete loss of medial joint space. No acute fracture or dislocation. Soft tissue swelling about the left lower extremity and foot. Overlying gauze obscures fine detail in the lower extremity and foot. No definite osteomyelitis. IMPRESSION: Soft tissue swelling about the left lower extremity and foot. No definite osteomyelitis. Electronically Signed   By: Minerva Fester M.D.   On: 09/28/2022 20:05   DG Foot Complete Left  Result Date: 09/28/2022 CLINICAL DATA:  Bleeding from legs.  Infection. EXAM: LEFT TIBIA AND FIBULA - 2 VIEW; LEFT FOOT - COMPLETE 3+ VIEW COMPARISON:  None Available. FINDINGS: Advanced arthritis left knee with near complete loss of medial joint space. No acute fracture or dislocation. Soft tissue swelling about the left lower extremity and foot. Overlying gauze obscures fine detail in the lower extremity and foot. No definite osteomyelitis. IMPRESSION: Soft tissue swelling about the left lower extremity and foot. No definite osteomyelitis. Electronically Signed   By: Minerva Fester M.D.   On:  09/28/2022 20:05    Procedures Procedures    Medications Ordered in ED Medications - No data to display  ED Course/ Medical Decision Making/ A&P                                 Medical Decision Making Amount and/or Complexity of Data Reviewed Labs: ordered. Radiology: ordered.   Patient with potential lower extremity infections.  Has had her Unna boots on for more likely months.  Has been removed on the top of the boot on the left lower leg appears to have some infection.  X-rays independently interpreted and show no clear osteomyelitis.  Lab work overall is reassuring however I think patient would [facility] for antibiotics, wound care and definitely needs help with social issues since she is allowed to get this bad over the last couple months.  Will discuss with hospitalist.        Final Clinical Impression(s) / ED Diagnoses Final diagnoses:  Lymphedema  Cellulitis of left lower extremity    Rx / DC Orders ED Discharge Orders     None         Benjiman Core, MD 09/28/22 2022

## 2022-09-28 NOTE — H&P (Incomplete)
Shelby Wiley DOB: 05-31-1935 DOA: 09/28/2022   PCP: Clayborn Heron, MD   Outpatient Specialists:   [facility]  Dr. Reatha Harps, MD   Patient arrived to ER on 09/28/22 at 1510 Referred by Attending Benjiman Core, MD   Patient coming from:    home Lives alone,      Chief Complaint:   Chief Complaint  Patient presents with  . Wound Infection   HPI: Shelby Wiley is a 87 y.o. female with medical history significant of chronic leg wounds, lymphedema, HTN    Presented with bilateral leg wounds Has had Unna boots on for the past 2 months Lives at home alone  Evidence of severe wounds  In the past was seen by [facility] and vein had been managing them but they switched suppliers so she stopped seeing him  The wound started to bleed thorough she went to PCP and was seen was sent to ER No fever no chills    Denies significant ETOH intake   Does not smoke   No results found for: "SARSCOV2NAA"    Regarding pertinent Chronic problems:     Morbid obesity-   BMI Readings from Last 1 Encounters:  07/13/11 46.59 kg/m     While in ER:    Started on vancomycin    Lab Orders         Comprehensive metabolic panel         CBC with Differential         I-Stat Lactic Acid      Tibia/ foot bilateral non acute  Following Medications were ordered in ER: Medications - No data to display  _______     ED Triage Vitals  Encounter Vitals Group     BP 09/28/22 1525 120/77     Systolic BP Percentile --      Diastolic BP Percentile --      Pulse Rate 09/28/22 1525 93     Resp 09/28/22 1525 18     Temp 09/28/22 1525 97.8 F (36.6 C)     Temp Source 09/28/22 1525 Oral     SpO2 09/28/22 1652 100 %     Weight --      Height --      Head Circumference --      Peak Flow --      Pain Score --      Pain Loc --      Pain Education --      Exclude from Growth Chart --   WNUU(72)@     _________________________________________ Significant initial   Findings: Abnormal Labs Reviewed  COMPREHENSIVE METABOLIC PANEL - Abnormal; Notable for the following components:      Result Value   Potassium 3.4 (*)    Glucose, Bld 109 (*)    Calcium 8.6 (*)    Albumin 3.2 (*)    AST 11 (*)    All other components within normal limits    _________________________ Troponin   Cardiac Panel (last 3 results) Recent Labs    09/28/22 2248  CKTOTAL 34*    ECG: Ordered Personally reviewed and interpreted by me showing: HR : 87 Rhythm:  A.fib.   nonspecific changes, anterior infarct QTC 480   The recent clinical data is shown below. Vitals:   09/28/22 1652 09/28/22 1800 09/28/22 2049 09/28/22 2100  BP: 136/85 124/65  133/70  Pulse: 79 77  85  Resp: 16 18  18   Temp: 97.9 F (36.6  C)  97.7 F (36.5 C)   TempSrc: Oral  Oral   SpO2: 100% 95%  98%    WBC     Component Value Date/Time   WBC 10.3 09/28/2022 1828   LYMPHSABS 1.7 09/28/2022 1828   MONOABS 0.9 09/28/2022 1828   EOSABS 0.2 09/28/2022 1828   BASOSABS 0.1 09/28/2022 1828    Lactic Acid, Venous    Component Value Date/Time   LATICACIDVEN 1.2 09/28/2022 2124    Procalcitonin   Ordered      Results for orders placed or performed during the hospital encounter of 04/06/11  Surgical pcr screen     Status: None   Collection Time: 04/06/11  1:53 PM   Specimen: Nasal Mucosa; Nasal Swab  Result Value Ref Range Status   MRSA, PCR NEGATIVE NEGATIVE Final   Staphylococcus aureus NEGATIVE NEGATIVE Final    Comment:        The Xpert SA Assay (FDA approved for NASAL specimens only), is one component of a comprehensive surveillance program.  It is not intended to diagnose infection nor to guide or monitor treatment.    ABX started vancomycin and cefepime  No results found for the last 90 days.   __________________________________________________________ Recent Labs  Lab 09/28/22 1828  NA 139  K 3.4*  CO2 29  GLUCOSE 109*  BUN 11  CREATININE 0.68  CALCIUM 8.6*     Cr  stable,    Lab Results  Component Value Date   CREATININE 0.68 09/28/2022   CREATININE 0.75 12/03/2015   CREATININE 0.73 05/26/2015    Recent Labs  Lab 09/28/22 1828  AST 11*  ALT 7  ALKPHOS 83  BILITOT 1.0  PROT 6.9  ALBUMIN 3.2*   Lab Results  Component Value Date   CALCIUM 8.6 (L) 09/28/2022    Plt: Lab Results  Component Value Date   PLT 220 09/28/2022    Recent Labs  Lab 09/28/22 1828  WBC 10.3  NEUTROABS 7.4  HGB 12.4  HCT 38.6  MCV 97.5  PLT 220    HG/HCT stable,      Component Value Date/Time   HGB 12.4 09/28/2022 1828   HCT 38.6 09/28/2022 1828   MCV 97.5 09/28/2022 1828     _______________________________________________ Hospitalist was called for admission for   Lymphedema    Cellulitis of left lower extremity, lower ext wounds    The following Work up has been ordered so far:  Orders Placed This Encounter  Procedures  . DG Tibia/Fibula Left  . DG Tibia/Fibula Right  . DG Foot Complete Left  . DG Foot Complete Right  . Comprehensive metabolic panel  . CBC with Differential  . Consult to hospitalist  . I-Stat Lactic Acid     OTHER Significant initial  Findings:  labs showing:     Cultures: No results found for: "SDES", "SPECREQUEST", "CULT", "REPTSTATUS"   Radiological Exams on Admission: DG Tibia/Fibula Right  Result Date: 09/28/2022 CLINICAL DATA:  Infection, bleeding from legs. Best obtainable images EXAM: RIGHT TIBIA AND FIBULA - 2 VIEW; RIGHT FOOT COMPLETE - 3+ VIEW COMPARISON:  Radiographs 07/08/2009 FINDINGS: Right TKA. No acute fracture or dislocation. Diffuse soft tissue swelling about the right lower extremity and foot. Overlying gauze/bandage material obscures detail at the ankle and midfoot. No definite osteomyelitis. IMPRESSION: Diffuse soft tissue swelling about the right lower extremity and foot. No definite osteomyelitis. Electronically Signed   By: Minerva Fester M.D.   On: 09/28/2022 20:07   DG Foot  Complete Right  Result Date: 09/28/2022 CLINICAL DATA:  Infection, bleeding from legs. Best obtainable images EXAM: RIGHT TIBIA AND FIBULA - 2 VIEW; RIGHT FOOT COMPLETE - 3+ VIEW COMPARISON:  Radiographs 07/08/2009 FINDINGS: Right TKA. No acute fracture or dislocation. Diffuse soft tissue swelling about the right lower extremity and foot. Overlying gauze/bandage material obscures detail at the ankle and midfoot. No definite osteomyelitis. IMPRESSION: Diffuse soft tissue swelling about the right lower extremity and foot. No definite osteomyelitis. Electronically Signed   By: Minerva Fester M.D.   On: 09/28/2022 20:07   DG Tibia/Fibula Left  Result Date: 09/28/2022 CLINICAL DATA:  Bleeding from legs.  Infection. EXAM: LEFT TIBIA AND FIBULA - 2 VIEW; LEFT FOOT - COMPLETE 3+ VIEW COMPARISON:  None Available. FINDINGS: Advanced arthritis left knee with near complete loss of medial joint space. No acute fracture or dislocation. Soft tissue swelling about the left lower extremity and foot. Overlying gauze obscures fine detail in the lower extremity and foot. No definite osteomyelitis. IMPRESSION: Soft tissue swelling about the left lower extremity and foot. No definite osteomyelitis. Electronically Signed   By: Minerva Fester M.D.   On: 09/28/2022 20:05   DG Foot Complete Left  Result Date: 09/28/2022 CLINICAL DATA:  Bleeding from legs.  Infection. EXAM: LEFT TIBIA AND FIBULA - 2 VIEW; LEFT FOOT - COMPLETE 3+ VIEW COMPARISON:  None Available. FINDINGS: Advanced arthritis left knee with near complete loss of medial joint space. No acute fracture or dislocation. Soft tissue swelling about the left lower extremity and foot. Overlying gauze obscures fine detail in the lower extremity and foot. No definite osteomyelitis. IMPRESSION: Soft tissue swelling about the left lower extremity and foot. No definite osteomyelitis. Electronically Signed   By: Minerva Fester M.D.   On: 09/28/2022 20:05    _______________________________________________________________________________________________________ Latest  Blood pressure 133/70, pulse 85, temperature 97.7 F (36.5 C), temperature source Oral, resp. rate 18, SpO2 98%.   Vitals  labs and radiology finding personally reviewed  Review of Systems:    Pertinent positives include:   fatigue, wounds   Constitutional:  No weight loss, night sweats, Fevers, chills,, weight loss  HEENT:  No headaches, Difficulty swallowing,Tooth/dental problems,Sore throat,  No sneezing, itching, ear ache, nasal congestion, post nasal drip,  Cardio-vascular:  No chest pain, Orthopnea, PND, anasarca, dizziness, palpitations.no Bilateral lower extremity swelling  GI:  No heartburn, indigestion, abdominal pain, nausea, vomiting, diarrhea, change in bowel habits, loss of appetite, melena, blood in stool, hematemesis Resp:  no shortness of breath at rest. No dyspnea on exertion, No excess mucus, no productive cough, No non-productive cough, No coughing up of blood.No change in color of mucus.No wheezing. Skin:  no rash or lesions. No jaundice GU:  no dysuria, change in color of urine, no urgency or frequency. No straining to urinate.  No flank pain.  Musculoskeletal:  No joint pain or no joint swelling. No decreased range of motion. No back pain.  Psych:  No change in mood or affect. No depression or anxiety. No memory loss.  Neuro: no localizing neurological complaints, no tingling, no weakness, no double vision, no gait abnormality, no slurred speech, no confusion  All systems reviewed and apart from HOPI all are negative _______________________________________________________________________________________________ Past Medical History:   Past Medical History:  Diagnosis Date  . Arthritis    PAIN AND OA BOTH KNEES  . Hypertension   . Shortness of breath    PT RELATES TO HER WEIGHT-OUT OF SHAPE  . Sleep apnea  STOP BANG SCORE 5  . Stroke  (HCC)    POSS TIA IN 2012       Past Surgical History:  Procedure Laterality Date  . EYE SURGERY     BILATERAL CATARACT EXTRACTION  . LEFT KNEE ARTHROSCOPY    . TOTAL KNEE ARTHROPLASTY  04/17/2011   Procedure: TOTAL KNEE ARTHROPLASTY;  Surgeon: Loanne Drilling, MD;  Location: [facility];  Service: Orthopedics;  Laterality: Right;    Social History:  Ambulatory   independently     reports that she has quit smoking. Her smoking use included cigarettes. She has a 11.3 pack-year smoking history. She has never used smokeless tobacco. She reports current alcohol use. She reports that she does not use drugs.    Family History:   History reviewed. No pertinent family history. ______________________________________________________________________________________________ Allergies: Allergies  Allergen Reactions  . Codeine Nausea And Vomiting and Other (See Comments)    Caused GI upset  . Codeine Sulfate Nausea And Vomiting and Other (See Comments)    Caused GI upset     Prior to Admission medications   Medication Sig Start Date End Date Taking? Authorizing Provider  amoxicillin (AMOXIL) 500 MG capsule Take 2,000 mg by mouth See admin instructions. Take 2,000 mg by mouth one hour prior to dental appointments.    [provider]  aspirin 325 MG EC tablet Take 325 mg by mouth 3 (three) times a week.    [provider]  furosemide (LASIX) 40 MG tablet Take 40 mg by mouth daily.    [provider]  hydrochlorothiazide (MICROZIDE) 12.5 MG capsule Take 12.5 mg by mouth at bedtime. Patient not taking: Reported on 11/17/2020    [provider]  ketoconazole (NIZORAL) 2 % cream Apply to both feet and between toes once daily for 6 weeks. 12/09/21   Freddie Breech, DPM  ondansetron (ZOFRAN ODT) 4 MG disintegrating tablet 4mg  ODT q4 hours prn nausea/vomit Patient not taking: Reported on 11/17/2020 05/27/15   Loren Racer, MD     ___________________________________________________________________________________________________ Physical Exam:    09/28/2022    9:00 PM 09/28/2022    6:00 PM 09/28/2022    4:52 PM  Vitals with BMI  Systolic 133 124 161  Diastolic 70 65 85  Pulse 85 77 79     1. General:  in No  Acute distress    well   -appearing 2. Psychological: Alert and   Oriented to self and situation but asking questions repetitively 3. Head/ENT:   Moist   Mucous Membranes                          Head Non traumatic, neck supple                          Poor Dentition 4. SKIN:  decreased Skin turgor,  Skin           5. Heart: Regular rate and rhythm no  Murmur, no Rub or gallop 6. Lungs:  no wheezes or crackles   7. Abdomen: Soft,   non-tender, Non distended   obese  bowel sounds present 8. Lower extremities: no clubbing, cyanosis, no  edema chronic venous statis changes 9. Neurologically Grossly intact, moving all 4 extremities equally   10. MSK: Normal range of motion    Chart has been reviewed  ______________________________________________________________________________________________  Assessment/Plan 87 y.o. female with medical history significant of chronic leg wounds, lymphedema,  HTN    Admitted for   Lymphedema  Cellulitis of left lower extremity, infected wounds and new A.fib     Present on Admission: . Cellulitis . Hypokalemia . OBESITY, MORBID . HYPERTENSION, BORDERLINE . A-fib (HCC)     Cellulitis -admit per  cellulitis protocol will   continue current antibiotic choice cefepime vanc      plain films showed:   no evidence of air  no evidence of osteomyelitis         Will obtain MRSA screening,       obtain blood cultures  if febrile or septic     further antibiotic adjustment pending above results   Hypokalemia - will replace electrolytes and repeat  check Mg, phos and Ca level and replace as needed Monitor on telemetry   Lab Results  Component Value Date    K 3.4 (L) 09/28/2022     Lab Results  Component Value Date   CREATININE 0.68 09/28/2022   No results found for: "MG" Lab Results  Component Value Date   CALCIUM 8.6 (L) 09/28/2022     OBESITY, MORBID Will need nutritional assess ment as an out pt   HYPERTENSION, BORDERLINE Allow permissive HTn   A-fib (HCC) New diagnosis  - Admit to step down on Cardizem drip       CHA2D-VASC score            Will start on ***Heparin, coumadin, newer anticoagulant           ***not a candidate for anticoagulation due to risks of falls *** active bleeding      Check TSH      Cycle cardiac enzymes      Obtain ECHO      Cardiology consult in AM       Other plan as per orders.  DVT prophylaxis:  hep [place]    Code Status:    Code Status: Prior FULL CODE  as per patient   I had personally discussed CODE STATUS with patient  ACP   none    Family Communication:   Family not at  Bedside    Diet  heart healthy   Disposition Plan:   *** likely will need placement for rehabilitation                          Back to current facility when stable                            To home once workup is complete and patient is stable  ***Following barriers for discharge:                             Chest pain *** Stroke *** work up is complete                            Electrolytes corrected                               Anemia corrected h/H stable                             Pain controlled with PO medications  Afebrile, white count improving able to transition to PO antibiotics                             Will need to be able to tolerate PO                            Will likely need home health, home O2, set up                           Will need consultants to evaluate patient prior to discharge       Consult Orders  (From admission, onward)           Start     Ordered   09/28/22 2045  Consult to hospitalist  Once       Provider:  (Not yet  assigned)  Question Answer Comment  Place call to: [facility]   Reason for Consult Admit      09/28/22 2044                              ***Would benefit from PT/OT eval prior to DC  Ordered                   Swallow eval - SLP ordered                   Diabetes care coordinator                   Transition of care consulted                   Nutrition    consulted                  Wound care  consulted                   Palliative care    consulted                   Behavioral health  consulted                    Consults called: ***     Admission status:  ED Disposition     ED Disposition  Admit   Condition  --   Comment  Hospital Area: [facility] [[id number]]  Level of Care: Telemetry [5]  Admit to tele based on following criteria: Other see comments  Comments: infection  May admit patient to [facility] or [facility] if equivalent level of care is available:: No  Covid Evaluation: Asymptomatic - no recent exposure (last 10 days) testing not required  Diagnosis: Cellulitis [[id number]]  Admitting Physician: Therisa Doyne [[id number]]  Attending Physician: Therisa Doyne [[id number]]  Certification:: I certify this patient will need inpatient services for at least 2 midnights  Expected Medical Readiness: 10/01/2022           Obs***  ***  inpatient     I Expect 2 midnight stay secondary to severity of patient's current illness need for inpatient interventions justified by the following: ***hemodynamic instability despite optimal treatment (tachycardia *hypotension * tachypnea *hypoxia, hypercapnia) * Severe lab/radiological/exam abnormalities including:     and extensive comorbidities including: *substance abuse  *Chronic pain *DM2  * CHF *  CAD  * COPD/asthma *Morbid Obesity * CKD *dementia *liver disease *history of stroke with residual deficits *  malignancy, * sickle cell disease  History of amputation Chronic  anticoagulation  That are currently affecting medical management.   I expect  patient to be hospitalized for 2 midnights requiring inpatient medical care.  Patient is at high risk for adverse outcome (such as loss of life or disability) if not treated.  Indication for inpatient stay as follows:  Severe change from baseline regarding mental status Hemodynamic instability despite maximal medical therapy,  ongoing suicidal ideations,  severe pain requiring acute inpatient management,  inability to maintain oral hydration   persistent chest pain despite medical management Need for operative/procedural  intervention New or worsening hypoxia   Need for IV antibiotics, IV fluids, IV rate controling medications, IV antihypertensives, IV pain medications, IV anticoagulation, need for biPAP    Level of care     tele   12H       Lyndel Dancel 09/28/2022, 9:39 PM ***  [facility]     after 2 AM please page floor coverage PA If 7AM-7PM, please contact the day team taking care of the patient using [email or web address]

## 2022-09-28 NOTE — Assessment & Plan Note (Signed)
-   will replace electrolytes and repeat  check Mg, phos and Ca level and replace as needed Monitor on telemetry   Lab Results  Component Value Date   K 3.4 (L) 09/28/2022     Lab Results  Component Value Date   CREATININE 0.68 09/28/2022   No results found for: "MG" Lab Results  Component Value Date   CALCIUM 8.6 (L) 09/28/2022

## 2022-09-28 NOTE — Assessment & Plan Note (Signed)
Will need nutritional assess ment as an out pt

## 2022-09-28 NOTE — Assessment & Plan Note (Signed)
Allow permissive HTn

## 2022-09-28 NOTE — Subjective & Objective (Signed)
Has had Unna boots on for the past 2 months Lives at home alone  Evidence of severe wounds  In the past was seen by [facility] and vein had been managing them but they switched suppliers so she stopped seeing him  The wound started to bleed thorough she went to PCP and was seen was sent to ER No fever no chills

## 2022-09-28 NOTE — Assessment & Plan Note (Addendum)
Significant bilateral wounds I suspect that patient may have mild degree of dementia and was not realizing that she has gone for quite a while without having any dressings done She would benefit from PT OT assessment and possible SNF placement for wound care -admit per  cellulitis protocol will   continue current antibiotic choice cefepime vanc      plain films showed:   no evidence of air  no evidence of osteomyelitis         Will obtain MRSA screening,       obtain blood cultures  if febrile or septic     further antibiotic adjustment pending above results

## 2022-09-28 NOTE — ED Notes (Signed)
Okay received from both Dr. Rubin Payor and Dr. Adela Glimpse that wet to dry saline soaked kerlix okay to keep in place on bilateral lower extremities d/t remnants of gauze from previous dressing still stuck to skin, until wound nurse able to assess patient extensive wound needs.

## 2022-09-28 NOTE — Assessment & Plan Note (Addendum)
New diagnosis  - Admit to step down on Cardizem drip       CHA2D-VASC score  4          Will start on  Heparin for tonight as pt may need procedures done for her leg wounds               Check TSH      Cycle cardiac enzymes      Obtain ECHO Will need follow up with A.fib clinic

## 2022-09-28 NOTE — Progress Notes (Signed)
A consult was received from an ED physician for Vancomycin per pharmacy dosing.  The patient's profile has been reviewed for ht/wt/allergies/indication/available labs.    A one time order has been placed for Vancomycin 1gm IV.    Further antibiotics/pharmacy consults should be ordered by admitting physician if indicated.                       Thank you, Maryellen Pile, PharmD 09/28/2022  9:49 PM

## 2022-09-29 ENCOUNTER — Inpatient Hospital Stay (HOSPITAL_COMMUNITY): Payer: Medicare Other

## 2022-09-29 DIAGNOSIS — I480 Paroxysmal atrial fibrillation: Secondary | ICD-10-CM | POA: Diagnosis not present

## 2022-09-29 DIAGNOSIS — L03116 Cellulitis of left lower limb: Secondary | ICD-10-CM | POA: Diagnosis not present

## 2022-09-29 DIAGNOSIS — M79606 Pain in leg, unspecified: Secondary | ICD-10-CM | POA: Diagnosis not present

## 2022-09-29 DIAGNOSIS — I890 Lymphedema, not elsewhere classified: Secondary | ICD-10-CM | POA: Diagnosis not present

## 2022-09-29 DIAGNOSIS — E876 Hypokalemia: Secondary | ICD-10-CM | POA: Diagnosis not present

## 2022-09-29 LAB — MRSA NEXT GEN BY PCR, NASAL: MRSA by PCR Next Gen: NOT DETECTED

## 2022-09-29 LAB — COMPREHENSIVE METABOLIC PANEL
ALT: 6 U/L (ref 0–44)
AST: 9 U/L — ABNORMAL LOW (ref 15–41)
Albumin: 2.4 g/dL — ABNORMAL LOW (ref 3.5–5.0)
Alkaline Phosphatase: 68 U/L (ref 38–126)
Anion gap: 6 (ref 5–15)
BUN: 10 mg/dL (ref 8–23)
CO2: 26 mmol/L (ref 22–32)
Calcium: 8 mg/dL — ABNORMAL LOW (ref 8.9–10.3)
Chloride: 105 mmol/L (ref 98–111)
Creatinine, Ser: 0.58 mg/dL (ref 0.44–1.00)
GFR, Estimated: >60 mL/min (ref >60)
Glucose, Bld: 87 mg/dL (ref 70–99)
Potassium: 3.5 mmol/L (ref 3.5–5.1)
Sodium: 137 mmol/L (ref 135–145)
Total Bilirubin: 0.7 mg/dL (ref 0.3–1.2)
Total Protein: 5.4 g/dL — ABNORMAL LOW (ref 6.5–8.1)

## 2022-09-29 LAB — CBC
HCT: 37.2 % (ref 36.0–46.0)
Hemoglobin: 11.7 g/dL — ABNORMAL LOW (ref 12.0–15.0)
MCH: 32 pg (ref 26.0–34.0)
MCHC: 31.5 g/dL (ref 30.0–36.0)
MCV: 101.6 fL — ABNORMAL HIGH (ref 80.0–100.0)
Platelets: 178 10*3/uL (ref 150–400)
RBC: 3.66 MIL/uL — ABNORMAL LOW (ref 3.87–5.11)
RDW: 12.6 % (ref 11.5–15.5)
WBC: 8 10*3/uL (ref 4.0–10.5)
nRBC: 0 % (ref 0.0–0.2)

## 2022-09-29 LAB — PROTIME-INR
INR: 1.2 (ref 0.8–1.2)
Prothrombin Time: 15.4 s — ABNORMAL HIGH (ref 11.4–15.2)

## 2022-09-29 LAB — HEMOGLOBIN A1C
Hgb A1c MFr Bld: 5.2 % (ref 4.8–5.6)
Mean Plasma Glucose: 102.54 mg/dL

## 2022-09-29 LAB — HEPARIN LEVEL (UNFRACTIONATED): Heparin Unfractionated: 0.22 [IU]/mL — ABNORMAL LOW (ref 0.30–0.70)

## 2022-09-29 LAB — C-REACTIVE PROTEIN: CRP: 1.3 mg/dL — ABNORMAL HIGH (ref <1.0)

## 2022-09-29 LAB — PREALBUMIN: Prealbumin: <5 mg/dL — ABNORMAL LOW (ref 18–38)

## 2022-09-29 LAB — MAGNESIUM: Magnesium: 2.1 mg/dL (ref 1.7–2.4)

## 2022-09-29 LAB — APTT: aPTT: 40 s — ABNORMAL HIGH (ref 24–36)

## 2022-09-29 LAB — PHOSPHORUS: Phosphorus: 2.6 mg/dL (ref 2.5–4.6)

## 2022-09-29 LAB — SEDIMENTATION RATE: Sed Rate: 13 mm/h (ref 0–22)

## 2022-09-29 MED ORDER — SODIUM CHLORIDE 0.9 % IV SOLN
2.0000 g | Freq: Three times a day (TID) | INTRAVENOUS | Status: DC
  Administered 2022-09-29 – 2022-09-30 (×4): 2 g via INTRAVENOUS
  Filled 2022-09-29 (×4): qty 12.5

## 2022-09-29 MED ORDER — POTASSIUM CHLORIDE CRYS ER 20 MEQ PO TBCR
40.0000 meq | EXTENDED_RELEASE_TABLET | ORAL | Status: AC
  Administered 2022-09-29 (×2): 40 meq via ORAL
  Filled 2022-09-29 (×2): qty 2

## 2022-09-29 MED ORDER — VITAMIN C 500 MG PO TABS
500.0000 mg | ORAL_TABLET | Freq: Every day | ORAL | Status: DC
  Administered 2022-09-29 – 2022-10-03 (×5): 500 mg via ORAL
  Filled 2022-09-29 (×5): qty 1

## 2022-09-29 MED ORDER — SODIUM CHLORIDE 0.9% FLUSH
3.0000 mL | Freq: Two times a day (BID) | INTRAVENOUS | Status: DC
  Administered 2022-09-28 – 2022-09-30 (×3): 3 mL via INTRAVENOUS

## 2022-09-29 MED ORDER — SODIUM CHLORIDE 0.9% FLUSH: 3.0000 mL | INTRAVENOUS | Status: DC | PRN

## 2022-09-29 MED ORDER — ACETAMINOPHEN 325 MG PO TABS
650.0000 mg | ORAL_TABLET | Freq: Four times a day (QID) | ORAL | Status: DC | PRN
  Administered 2022-09-30: 650 mg via ORAL
  Filled 2022-09-29: qty 2

## 2022-09-29 MED ORDER — HYDROCODONE-ACETAMINOPHEN 5-325 MG PO TABS
1.0000 | ORAL_TABLET | ORAL | Status: DC | PRN
  Administered 2022-10-01: 1 via ORAL
  Administered 2022-10-03: 2 via ORAL
  Filled 2022-09-29: qty 1
  Filled 2022-09-29: qty 2

## 2022-09-29 MED ORDER — HEPARIN (PORCINE) 25000 UT/250ML-% IV SOLN
1450.0000 [IU]/h | INTRAVENOUS | Status: DC
  Administered 2022-09-29: 1250 [IU]/h via INTRAVENOUS
  Filled 2022-09-29: qty 250

## 2022-09-29 MED ORDER — SODIUM CHLORIDE 0.9 % IV SOLN: 250.0000 mL | INTRAVENOUS | Status: DC | PRN

## 2022-09-29 MED ORDER — APIXABAN 5 MG PO TABS
5.0000 mg | ORAL_TABLET | Freq: Two times a day (BID) | ORAL | Status: DC
  Administered 2022-09-29 – 2022-10-03 (×9): 5 mg via ORAL
  Filled 2022-09-29 (×9): qty 1

## 2022-09-29 MED ORDER — HEPARIN BOLUS VIA INFUSION
4000.0000 [IU] | Freq: Once | INTRAVENOUS | Status: AC
  Administered 2022-09-29: 4000 [IU] via INTRAVENOUS
  Filled 2022-09-29: qty 4000

## 2022-09-29 MED ORDER — JUVEN PO PACK
1.0000 | PACK | Freq: Two times a day (BID) | ORAL | Status: DC
  Administered 2022-09-29 – 2022-10-03 (×8): 1 via ORAL
  Filled 2022-09-29 (×8): qty 1

## 2022-09-29 MED ORDER — VANCOMYCIN HCL 1500 MG/300ML IV SOLN
1500.0000 mg | INTRAVENOUS | Status: AC
  Administered 2022-09-29: 1500 mg via INTRAVENOUS
  Filled 2022-09-29: qty 300

## 2022-09-29 MED ORDER — ONDANSETRON HCL 4 MG PO TABS: 4.0000 mg | ORAL_TABLET | Freq: Four times a day (QID) | ORAL | Status: DC | PRN

## 2022-09-29 MED ORDER — ONDANSETRON HCL 4 MG/2ML IJ SOLN: 4.0000 mg | Freq: Four times a day (QID) | INTRAMUSCULAR | Status: DC | PRN

## 2022-09-29 MED ORDER — ZINC SULFATE 220 (50 ZN) MG PO CAPS
220.0000 mg | ORAL_CAPSULE | Freq: Every day | ORAL | Status: DC
  Administered 2022-09-29 – 2022-10-03 (×5): 220 mg via ORAL
  Filled 2022-09-29 (×5): qty 1

## 2022-09-29 MED ORDER — VANCOMYCIN HCL 1250 MG/250ML IV SOLN
1250.0000 mg | INTRAVENOUS | Status: DC
  Administered 2022-09-30: 1250 mg via INTRAVENOUS
  Filled 2022-09-29: qty 250

## 2022-09-29 MED ORDER — ACETAMINOPHEN 650 MG RE SUPP: 650.0000 mg | Freq: Four times a day (QID) | RECTAL | Status: DC | PRN

## 2022-09-29 NOTE — Assessment & Plan Note (Signed)
Admitted for bilateral leg cellulitis. Appears that her unna boots have been left on both lower legs for a prolonged period of time.  Wound care has removed most of the old wrappings. Some old wrapping adherent to lower leg skin. Have asked wound care to place saline soaked kerlex gauze around lower legs to soften the old wrappings so they are easier to remove this afternoon. Legs already look better compared to yesterday. See pictures  Admission 09-28-2022 lower legs 09-29-2022 lower legs

## 2022-09-29 NOTE — Progress Notes (Signed)
Pt arrived to unit via stretcher room 1508. Alert and oriented x4. Oriented to room and callbell with no complications. Pt  soiled,  staff immediately bathed and placed hospital gown on pt. Initial assessment completed. 2RN assessment completed. Plan of care ongoing.

## 2022-09-29 NOTE — Subjective & Objective (Addendum)
Pt seen and examined. Lives alone. Has been non-compliant with f/u for her unna boots. Wound care RN at bedside.  She has removed most of the old unna boot wraps. Skin underneath wraps looks healthy in most spots. There are obvious compression ulcers due to unna boot on the left leg. See pictures. Pt very talkative.  Likes to repeat the phrase "from your lips to God's ears" a lot.

## 2022-09-29 NOTE — Progress Notes (Signed)
Initial Nutrition Assessment  INTERVENTION:   For wound healing: -1 packet Juven BID, each packet provides 95 calories, 2.5 grams of protein (collagen), and 9.8 grams of carbohydrate (3 grams sugar); also contains 7 grams of L-arginine and L-glutamine, 300 mg vitamin C, 15 mg vitamin E, 1.2 mcg vitamin B-12, 9.5 mg zinc, 200 mg calcium, and 1.5 g  Calcium Beta-hydroxy-Beta-methylbutyrate to support wound healing -500 mg Vitamin C daily -220 mg Zinc sulfate daily x 14 days  NUTRITION DIAGNOSIS:   Increased nutrient needs related to wound healing as evidenced by estimated needs.  GOAL:   Patient will meet greater than or equal to 90% of their needs  MONITOR:   PO intake, Supplement acceptance, Labs, Weight trends, I & O's, Skin  REASON FOR ASSESSMENT:   Consult Assessment of nutrition requirement/status  ASSESSMENT:   87 y.o. female with medical history significant of chronic leg wounds, lymphedema, HTN  Patient working with therapies at time of visit.  Will attempt to gather history at later time. Given review of wound photos, would recommend supplements and vitamins to aid in wound healing.  Will order Juven supplements as well as Vitamin C and Zinc. Patient is consuming 100% of house trays.  Per weight records, no recent weights before this admission.  Medications: KLOR-CON  Labs reviewed.  NUTRITION - FOCUSED PHYSICAL EXAM:  Unable to perform at this time.  Diet Order:   Diet Order             Diet Heart Room service appropriate? Yes; Fluid consistency: Thin  Diet effective now                   EDUCATION NEEDS:   Not appropriate for education at this time  Skin:  Skin Assessment: Reviewed RN Assessment  Last BM:  9/26  Height:   Ht Readings from Last 1 Encounters:  09/28/22 5\' 5"  (1.651 m)    Weight:   Wt Readings from Last 1 Encounters:  09/28/22 121.6 kg    BMI:  Body mass index is 44.6 kg/m.  Estimated Nutritional Needs:    Kcal:  1700-1900  Protein:  85-95g  Fluid:  1.9L/day  Tilda Franco, MS, RD, LDN Inpatient Clinical Dietitian Contact information available via Amion

## 2022-09-29 NOTE — Assessment & Plan Note (Signed)
New onset. Tsh pending. Keep K >4.0 and Mg >2.0 Check echo. CHAD-VASC is 6 Stop IV heparin. Start Eliquis.

## 2022-09-29 NOTE — Discharge Instructions (Signed)

## 2022-09-29 NOTE — Consult Note (Signed)
WOC Nurse Consult Note: patient with longstanding history of lymphedema, had previously received care at [facility] per patient; had unna boots placed but then has not followed up (worn unna boots for unknown amount of time)  Reason for Consult: leg wounds  Wound type: 1.  Full thickness  wounds B anterior legs likely due to pressure from unna boots  2. Scattered areas of partial thickness skin loss likely r/t venous insufficiency B anterior legs  Pressure Injury POA: yes, felt to be due to pressure from unna boots at most superior aspect  Measurement: 1.  R anterior leg 8 cm x 15 cm x 0.2 cm full thickness 90% pink moist 10% brown yellow tissue  2.  L anterior leg 3 cm x 15 cm x 0.8 cms 100% pink and moist  Scattered areas of partial thickness skin loss both legs 100% pink and moist  Wound bed: see above Drainage (amount, consistency, odor) moderate serous and minimal tan exudate  Periwound: non-pitting edema, hyperkeratotic skin; some debris from old unna boots  Dressing procedure/placement/frequency: Clean bilateral legs with Vashe wound cleanser, apply Vashe moistened gauze [place] [phone]) to wound beds daily, cover with dry gauze and ABD pads.  Wrap legs with Kerlix roll gauze (beginning just above toes and ending right below knees). Secure dressing with Ace bandages for light compression   Dr. Imogene Burn in room when wound care being performed. This RN was able to remove a significant amount of dead sloughing skin and debris from legs.  If unna boots replaced while inpatient patient must have follow-up in place for management.    Patient tearful at times, states her son died and she is estranged from daughter so she has no one but a friend who came with her to the Emergency Room last night.  She does not know the friends phone number but feels he will call her later to check on her and she can get it then.     POC discussed with bedside nurse, primary MD and patient.  WOC team  will not follow. Re-consult if further needs arise.   Thank you,    Priscella Mann MSN, RN-BC, CWOCN [phone]  ADDENDUM:  Returned to room at 2 pm and removed saline moistened kerlix. Was able to remove more dead skin and debris.  Rewrapped legs in Vashe moistened Kerlix, ABD pads and Kerlix roll gauze. Secured with Ace bandages.

## 2022-09-29 NOTE — Progress Notes (Signed)
ANTICOAGULATION CONSULT NOTE - Initial Consult  Pharmacy Consult for Heparin Indication: new onset atrial fibrillation  Allergies  Allergen Reactions   Codeine Nausea And Vomiting and Other (See Comments)    Caused GI upset   Codeine Sulfate Nausea And Vomiting and Other (See Comments)    Caused GI upset    Patient Measurements: Height: 5\' 5"  (165.1 cm) Weight: 121.6 kg (268 lb) IBW/kg (Calculated) : 57 Heparin Dosing Weight: 86 kg  Vital Signs: Temp: 98.9 F (37.2 C) (09/26 2345) Temp Source: Oral (09/26 2345) BP: 127/79 (09/26 2345) Pulse Rate: 98 (09/26 2345)  Labs: Recent Labs    09/28/22 1828 09/28/22 2248  HGB 12.4  --   HCT 38.6  --   PLT 220  --   CREATININE 0.68  --   CKTOTAL  --  34*    Estimated Creatinine Clearance: 68.4 mL/min (by C-G formula based on SCr of 0.68 mg/dL).   Medical History: Past Medical History:  Diagnosis Date   Arthritis    PAIN AND OA BOTH KNEES   Hypertension    Shortness of breath    PT RELATES TO HER WEIGHT-OUT OF SHAPE   Sleep apnea    STOP BANG SCORE 5   Stroke (HCC)    POSS TIA IN 2012     Medications:  No oral anticoagulation PTA  Assessment:  87 yr female admitted with cellulitis and infected wounds and new onset AFib  Goal of Therapy:  Heparin level 0.3-0.7 units/ml Monitor platelets by anticoagulation protocol: Yes   Plan:  Baseline aPTT and PT/INR ordered Heparin 4000 unit IV bolus x 1  Heparin gtt @ 1250 units/hr Heparin level 8 hr after heparin started Daily heparin level & CBC  Geoffrey Mankin, Joselyn Glassman, PharmD 09/29/2022,12:20 AM

## 2022-09-29 NOTE — Assessment & Plan Note (Signed)
Chronic. Will need home health for RN wound care. Probably better to use ACE wraps rather than unna boots due to pt's non-compliance and lack of followup.

## 2022-09-29 NOTE — Progress Notes (Signed)
ABI exam has been completed.   Results can be found under chart review under CV PROC. 09/29/2022 11:52 AM Kaloni Bisaillon RVT, RDMS

## 2022-09-29 NOTE — Assessment & Plan Note (Signed)
Chronic needs chronic wound care

## 2022-09-29 NOTE — Evaluation (Addendum)
Physical Therapy Evaluation Patient Details Name: Shelby Wiley MRN: [id number] DOB: 05/02/1935 Today's Date: 09/29/2022  History of Present Illness  87 y.o. female with medical history significant of chronic leg wounds, lymphedema, HTN.  Presented with bilateral leg wounds. Dx of cellulitis.  Clinical Impression  Pt admitted with above diagnosis. +2 max assist for supine to sit. Pt sat edge of bed for ~10 minutes with a heavy posterior lean requiring assist varying from min to max to bring trunk to neutral. She was not able to maintain her trunk upright independently so transfers were not attempted. Pt stated she walked into her Dr's office yesterday. She has had a significant decline in mobility. Patient will benefit from continued inpatient follow up therapy, <3 hours/day.  Pt currently with functional limitations due to the deficits listed below (see PT Problem List). Pt will benefit from acute skilled PT to increase their independence and safety with mobility to allow discharge.           If plan is discharge home, recommend the following: Two people to help with bathing/dressing/bathroom;Two people to help with walking and/or transfers;Assistance with cooking/housework;Assist for transportation;Help with stairs or ramp for entrance   Can travel by private vehicle   No    Equipment Recommendations Other (comment) (TBD at SNF)  Recommendations for Other Services       Functional Status Assessment Patient has had a recent decline in their functional status and demonstrates the ability to make significant improvements in function in a reasonable and predictable amount of time.     Precautions / Restrictions Precautions Precautions: Fall Precaution Comments: 1 fall in past 6 months Restrictions Weight Bearing Restrictions: No Other Position/Activity Restrictions: B leg wraps      Mobility  Bed Mobility Overal bed mobility: Needs Assistance Bed Mobility: Supine to Sit, Sit to  Supine     Supine to sit: Max assist, +2 for physical assistance Sit to supine: Total assist, +2 for physical assistance   General bed mobility comments: Pt initiated moving legs to EOB and assisted to get to full sit pushing on elbow. Pt required helicopter turn total assist back into bed.    Transfers                   General transfer comment: Pt unable to stand or attempt to transfer at this time.    Ambulation/Gait                  Stairs            Wheelchair Mobility     Tilt Bed    Modified Rankin (Stroke Patients Only)       Balance Overall balance assessment: History of Falls, Needs assistance Sitting-balance support: Feet supported, Bilateral upper extremity supported Sitting balance-Leahy Scale: Poor Sitting balance - Comments: pt with posterior lean requiring min to max assist to maintain neutral Postural control: Posterior lean   Standing balance-Leahy Scale: Zero Standing balance comment: unable to stand                             Pertinent Vitals/Pain Pain Assessment Pain Assessment: No/denies pain    Home Living Family/patient expects to be discharged to:: Private residence Living Arrangements: Alone Available Help at Discharge: Available PRN/intermittently;Family Type of Home: House Home Access: Stairs to enter   [employer] of Steps: 3   Home Layout: One level Home Equipment: Shower seat;Toilet riser;Cane -  single point;Rolling Walker (2 wheels)      Prior Function Prior Level of Function : Independent/Modified Independent;History of Falls (last six months);Driving             Mobility Comments: walks with SPC or RW ADLs Comments: sponge bathes bc of wraps on BLEs; independent sponge bathing and dressing; friend brings groceries     Extremity/Trunk Assessment   Upper Extremity Assessment Upper Extremity Assessment: Defer to OT evaluation    Lower Extremity Assessment Lower  Extremity Assessment: Generalized weakness;RLE deficits/detail;LLE deficits/detail RLE Deficits / Details: reports sensation is intact to light touch B feet, able to fully DF/PF B ankles; hips/knees not assessed this session 2* poor sitting balance and pt fatigue RLE Sensation: WNL LLE Deficits / Details: reports sensation is intact to light touch B feet, able to fully DF/PF B ankles, hips/knees not assessed this session 2* poor sitting balance and pt fatigue LLE Sensation: WNL    Cervical / Trunk Assessment Cervical / Trunk Assessment: Kyphotic  Communication   Communication Communication: Other (comment) (hyperverbal) Cueing Techniques: Verbal cues  Cognition Arousal: Alert Behavior During Therapy: Anxious Overall Cognitive Status: Within Functional Limits for tasks assessed                                 General Comments: Feel pt is most likely at baseline. Pt is hyperverbal and easily distracted and at times difficult to redirect.        General Comments General comments (skin integrity, edema, etc.): Pt very weak overall. Pt did walk into clinic yesterday so feel it is imperitive to mobilize pt as soon as possible before pt is too weak to move.    Exercises  Ankle pumps x 15 both AROM supine Gluteal sets x 5 both AROM supine   Assessment/Plan    PT Assessment Patient needs continued PT services  PT Problem List Decreased strength;Decreased activity tolerance;Decreased balance;Decreased mobility       PT Treatment Interventions Gait training;Therapeutic exercise;Functional mobility training;Therapeutic activities;Patient/family education    PT Goals (Current goals can be found in the Care Plan section)  Acute Rehab PT Goals Patient Stated Goal: to go home PT Goal Formulation: With patient Time For Goal Achievement: 10/13/22 Potential to Achieve Goals: Good    Frequency Min 1X/week     Co-evaluation PT/OT/SLP Co-Evaluation/Treatment: Yes Reason for  Co-Treatment: For patient/therapist safety;Complexity of the patient's impairments (multi-system involvement);To address functional/ADL transfers PT goals addressed during session: Mobility/safety with mobility;Balance         AM-PAC PT "6 Clicks" Mobility  Outcome Measure Help needed turning from your back to your side while in a flat bed without using bedrails?: Total Help needed moving from lying on your back to sitting on the side of a flat bed without using bedrails?: Total Help needed moving to and from a bed to a chair (including a wheelchair)?: Total Help needed standing up from a chair using your arms (e.g., wheelchair or bedside chair)?: Total Help needed to walk in hospital room?: Total Help needed climbing 3-5 steps with a railing? : Total 6 Click Score: 6    End of Session   Activity Tolerance: Patient limited by fatigue Patient left: in bed;with call bell/phone within reach;with bed alarm set Nurse Communication: Mobility status;Need for lift equipment PT Visit Diagnosis: Muscle weakness (generalized) (M62.81);Other abnormalities of gait and mobility (R26.89);Difficulty in walking, not elsewhere classified (R26.2)    Time: 1610-9604  PT Time Calculation (min) (ACUTE ONLY): 25 min   Charges:   PT Evaluation $PT Eval Moderate Complexity: 1 Mod   PT General Charges $$ ACUTE PT VISIT: 1 Visit         Tamala Ser PT 09/29/2022  Acute Rehabilitation Services  Office [phone]

## 2022-09-29 NOTE — Progress Notes (Signed)
PROGRESS NOTE    Shelby Wiley  ZOX:[id number] DOB: 1935/11/08 DOA: 09/28/2022 PCP: Clayborn Heron, MD  Subjective: Pt seen and examined. Lives alone. Has been non-compliant with f/u for her unna boots. Wound care RN at bedside.  She has removed most of the old unna boot wraps. Skin underneath wraps looks healthy in most spots. There are obvious compression ulcers due to unna boot on the left leg. See pictures. Pt very talkative.  Likes to repeat the phrase "from your lips to God's ears" a lot.   Hospital Course: HPI: Shelby Wiley is a 87 y.o. female with medical history significant of chronic leg wounds, lymphedema, HTN     Presented with bilateral leg wounds Has had Unna boots on for the past 2 months Lives at home alone  Evidence of severe wounds  In the past was seen by [facility] had been managing them but they switched suppliers so she stopped seeing him  The wound started to bleed thorough she went to PCP and was seen was sent to ER No fever no chills  Significant Events: Admitted 09/28/2022 for bilateral leg cellulitis, new afib   Significant Labs:   Significant Imaging Studies: Admission R/L tib/fib x-rays negative for osteomyelitis  Antibiotic Therapy: Anti-infectives (From admission, onward)    Start     Dose/Rate Route Frequency Ordered Stop   09/29/22 2359  vancomycin (VANCOREADY) IVPB 1250 mg/250 mL        1,250 mg 166.7 mL/hr over 90 Minutes Intravenous Every 24 hours 09/29/22 0029     09/29/22 0900  ceFEPIme (MAXIPIME) 2 g in sodium chloride 0.9 % 100 mL IVPB        2 g 200 mL/hr over 30 Minutes Intravenous Every 8 hours 09/29/22 0042     09/29/22 0115  vancomycin (VANCOREADY) IVPB 1500 mg/300 mL        1,500 mg 150 mL/hr over 120 Minutes Intravenous STAT 09/29/22 0026 09/29/22 0820   09/28/22 2315  ceFEPIme (MAXIPIME) 2 g in sodium chloride 0.9 % 100 mL IVPB        2 g 200 mL/hr over 30 Minutes Intravenous  Once 09/28/22 2313  09/29/22 0159   09/28/22 2200  vancomycin (VANCOCIN) IVPB 1000 mg/200 mL premix        1,000 mg 200 mL/hr over 60 Minutes Intravenous  Once 09/28/22 2148 09/29/22 0820       Procedures:   Consultants: Wound care    Assessment and Plan: * Bilateral cellulitis of lower leg Admitted for bilateral leg cellulitis. Appears that her unna boots have been left on both lower legs for a prolonged period of time.  Wound care has removed most of the old wrappings. Some old wrapping adherent to lower leg skin. Have asked wound care to place saline soaked kerlex gauze around lower legs to soften the old wrappings so they are easier to remove this afternoon. Legs already look better compared to yesterday. See pictures  Admission 09-28-2022 lower legs 09-29-2022 lower legs          A-fib (HCC) New onset. Tsh pending. Keep K >4.0 and Mg >2.0 Check echo. CHAD-VASC is 6 Stop IV heparin. Start Eliquis.  Lymphedema Chronic. Will need home health for RN wound care. Probably better to use ACE wraps rather than unna boots due to pt's non-compliance and lack of followup.  Hypokalemia Continue with oral replacement. Repeat BMP in AM  HYPERTENSION, BORDERLINE Stable.  OBESITY, MORBID Chronic. BMI 44.6  DVT prophylaxis:  apixaban (ELIQUIS) tablet 5 mg     Code Status: Full Code Family Communication: no family at bedside Disposition Plan: may need ALF Vs SNF VS return to home. Will need CM consult Reason for continuing need for hospitalization: remains on IV ABX, awaiting echo.  Objective: Vitals:   09/28/22 2345 09/29/22 0047 09/29/22 0450 09/29/22 0757  BP: 127/79 131/75 126/60 108/68  Pulse: 98 84 95 87  Resp: (!) 24 19  15   Temp: 98.9 F (37.2 C) 97.8 F (36.6 C) 98 F (36.7 C) 98.4 F (36.9 C)  TempSrc: Oral Oral Oral Oral  SpO2:  97% 98% 100%  Weight: 121.6 kg     Height: 5\' 5"  (1.651 m)       Intake/Output Summary (Last 24 hours) at 09/29/2022 1146 Last data filed at  09/29/2022 1009 Gross per 24 hour  Intake 1387.91 ml  Output 550 ml  Net 837.91 ml   Filed Weights   09/28/22 2345  Weight: 121.6 kg    Examination:  Physical Exam Vitals and nursing note reviewed.  Constitutional:      General: She is not in acute distress.    Appearance: She is not toxic-appearing.     Comments: Chronically ill appearing  HENT:     Nose: Nose normal.  Eyes:     General: No scleral icterus. Cardiovascular:     Rate and Rhythm: Normal rate. Rhythm irregular.  Pulmonary:     Effort: Pulmonary effort is normal.     Breath sounds: Normal breath sounds.  Abdominal:     General: Abdomen is protuberant. There is no distension.     Palpations: Abdomen is soft.     Tenderness: There is no abdominal tenderness.  Musculoskeletal:     Comments: See pictures of lower legs bilaterally  Skin:    General: Skin is warm and dry.     Capillary Refill: Capillary refill takes less than 2 seconds.     Findings: Lesion present.  Neurological:     Mental Status: She is alert and oriented to person, place, and time.     Data Reviewed: I have personally reviewed following labs and imaging studies  CBC: Recent Labs  Lab 09/28/22 1828 09/29/22 0532  WBC 10.3 8.0  NEUTROABS 7.4  --   HGB 12.4 11.7*  HCT 38.6 37.2  MCV 97.5 101.6*  PLT 220 178   Basic Metabolic Panel: Recent Labs  Lab 09/28/22 1828 09/28/22 2248 09/29/22 0532  NA 139  --  137  K 3.4*  --  3.5  CL 101  --  105  CO2 29  --  26  GLUCOSE 109*  --  87  BUN 11  --  10  CREATININE 0.68  --  0.58  CALCIUM 8.6*  --  8.0*  MG  --  2.2 2.1  PHOS  --  2.9 2.6   GFR: Estimated Creatinine Clearance: 68.4 mL/min (by C-G formula based on SCr of 0.58 mg/dL). Liver Function Tests: Recent Labs  Lab 09/28/22 1828 09/29/22 0532  AST 11* 9*  ALT 7 6  ALKPHOS 83 68  BILITOT 1.0 0.7  PROT 6.9 5.4*  ALBUMIN 3.2* 2.4*    Coagulation Profile: Recent Labs  Lab 09/29/22 0532  INR 1.2   Cardiac  Enzymes: Recent Labs  Lab 09/28/22 2248  CKTOTAL 34*    HbA1C: Recent Labs    09/28/22 2249  HGBA1C 5.2    Sepsis Labs: Recent Labs  Lab 09/28/22  1834 09/28/22 2124 09/28/22 2248  PROCALCITON  --   --  <0.10  LATICACIDVEN 0.6 1.2  --     Recent Results (from the past 240 hour(s))  Aerobic Culture w Gram Stain (superficial specimen)     Status: None (Preliminary result)   Collection Time: 09/28/22 10:47 PM   Specimen: Wound  Result Value Ref Range Status   Specimen Description   Final    WOUND LEFT LOWER LEG Performed at [facility], [address] [place], [place] [address]    Special Requests   Final    NONE Performed at [facility], [address], [place], [place] [address]    Gram Stain   Final    FEW WBC PRESENT, PREDOMINANTLY MONONUCLEAR FEW GRAM POSITIVE COCCI IN PAIRS RARE GRAM NEGATIVE RODS Performed at [facility], [address], [place], [place] [address]    Culture PENDING  Incomplete   Report Status PENDING  Incomplete  MRSA Next Gen by PCR, Nasal     Status: None   Collection Time: 09/29/22  2:26 AM   Specimen: Nasal Mucosa; Nasal Swab  Result Value Ref Range Status   MRSA by PCR Next Gen NOT DETECTED NOT DETECTED Final    Comment: (NOTE) The GeneXpert MRSA Assay (FDA approved for NASAL specimens only), is one component of a comprehensive MRSA colonization surveillance program. It is not intended to diagnose MRSA infection nor to guide or monitor treatment for MRSA infections. Test performance is not FDA approved in patients less than 41 years old. Performed at [facility], [address], [place], [place] [address]      Radiology Studies: DG Tibia/Fibula Right  Result Date: 09/28/2022 CLINICAL DATA:  Infection, bleeding from legs. Best obtainable images EXAM: RIGHT TIBIA AND FIBULA - 2 VIEW; RIGHT FOOT COMPLETE - 3+ VIEW COMPARISON:  Radiographs 07/08/2009 FINDINGS:  Right TKA. No acute fracture or dislocation. Diffuse soft tissue swelling about the right lower extremity and foot. Overlying gauze/bandage material obscures detail at the ankle and midfoot. No definite osteomyelitis. IMPRESSION: Diffuse soft tissue swelling about the right lower extremity and foot. No definite osteomyelitis. Electronically Signed   By: Minerva Fester M.D.   On: 09/28/2022 20:07   DG Foot Complete Right  Result Date: 09/28/2022 CLINICAL DATA:  Infection, bleeding from legs. Best obtainable images EXAM: RIGHT TIBIA AND FIBULA - 2 VIEW; RIGHT FOOT COMPLETE - 3+ VIEW COMPARISON:  Radiographs 07/08/2009 FINDINGS: Right TKA. No acute fracture or dislocation. Diffuse soft tissue swelling about the right lower extremity and foot. Overlying gauze/bandage material obscures detail at the ankle and midfoot. No definite osteomyelitis. IMPRESSION: Diffuse soft tissue swelling about the right lower extremity and foot. No definite osteomyelitis. Electronically Signed   By: Minerva Fester M.D.   On: 09/28/2022 20:07   DG Tibia/Fibula Left  Result Date: 09/28/2022 CLINICAL DATA:  Bleeding from legs.  Infection. EXAM: LEFT TIBIA AND FIBULA - 2 VIEW; LEFT FOOT - COMPLETE 3+ VIEW COMPARISON:  None Available. FINDINGS: Advanced arthritis left knee with near complete loss of medial joint space. No acute fracture or dislocation. Soft tissue swelling about the left lower extremity and foot. Overlying gauze obscures fine detail in the lower extremity and foot. No definite osteomyelitis. IMPRESSION: Soft tissue swelling about the left lower extremity and foot. No definite osteomyelitis. Electronically Signed   By: Minerva Fester M.D.   On: 09/28/2022 20:05   DG Foot Complete Left  Result Date:  09/28/2022 CLINICAL DATA:  Bleeding from legs.  Infection. EXAM: LEFT TIBIA AND FIBULA - 2 VIEW; LEFT FOOT - COMPLETE 3+ VIEW COMPARISON:  None Available. FINDINGS: Advanced arthritis left knee with near complete loss of  medial joint space. No acute fracture or dislocation. Soft tissue swelling about the left lower extremity and foot. Overlying gauze obscures fine detail in the lower extremity and foot. No definite osteomyelitis. IMPRESSION: Soft tissue swelling about the left lower extremity and foot. No definite osteomyelitis. Electronically Signed   By: Minerva Fester M.D.   On: 09/28/2022 20:05    Scheduled Meds:  apixaban  5 mg Oral BID   potassium chloride  40 mEq Oral Q4H   sodium chloride flush  3 mL Intravenous Q12H   Continuous Infusions:  sodium chloride     ceFEPime (MAXIPIME) IV     vancomycin       LOS: 1 day   Time spent: 40 minutes  Carollee Herter, DO  [facility]  09/29/2022, 11:46 AM

## 2022-09-29 NOTE — TOC Progression Note (Signed)
Transition of Care [facility]) - Progression Note    Patient Details  Name: Shelby Wiley MRN: [id number] Date of Birth: [birth year]  [facility] CM/SW Contact  Otelia Santee, LCSW Phone Number: 09/29/2022, 4:03 PM  Clinical Narrative:    Attempted to meet with pt to discuss SNF placement however pt was meeting with another team member. TOC will follow up with pt and begin SNF work up if pt is agreeable.         Expected Discharge Plan and Services                                               Social Determinants of Health (SDOH) Interventions SDOH Screenings   Food Insecurity: No Food Insecurity (09/29/2022)  Housing: Low Risk  (09/29/2022)  Transportation Needs: No Transportation Needs (09/29/2022)  Utilities: Not At Risk (09/29/2022)  Tobacco Use: Medium Risk (09/28/2022)    Readmission Risk Interventions    09/29/2022    4:02 PM  Readmission Risk Prevention Plan  Post Dischage Appt Complete  Medication Screening Complete  Transportation Screening Complete

## 2022-09-29 NOTE — Progress Notes (Signed)
Pharmacy Antibiotic Note  Shelby Wiley is a 87 y.o. female admitted on 09/28/2022 with bilateral lower extremity cellulitis/wound infection.  Pharmacy has been consulted for Vancomycin and Cefepime dosing.  Patient received Vancomycin 1gm IV x 1 in the ED. Upon arrival to floor, patient weight entered into EPIC and recorded as 121.6kg  Plan: Give an additional Vancomycin 1500mg  IV x 1 dose now (for total loading dose of 2500mg  (20mg /kg)) Vancomycin 1250 mg IV Q 24 hrs. Goal AUC 400-550.  Expected AUC: 472.7  SCr used: 0.8 (rounded up from 0.68) Cefepime 2gm IV q8h Follow renal function and clinical course F/u culture result and sensivities  Height: 5\' 5"  (165.1 cm) Weight: 121.6 kg (268 lb) IBW/kg (Calculated) : 57  Temp (24hrs), Avg:98.1 F (36.7 C), Min:97.7 F (36.5 C), Max:98.9 F (37.2 C)  Recent Labs  Lab 09/28/22 1828 09/28/22 1834 09/28/22 2124  WBC 10.3  --   --   CREATININE 0.68  --   --   LATICACIDVEN  --  0.6 1.2    Estimated Creatinine Clearance: 68.4 mL/min (by C-G formula based on SCr of 0.68 mg/dL).    Allergies  Allergen Reactions   Codeine Nausea And Vomiting and Other (See Comments)    Caused GI upset   Codeine Sulfate Nausea And Vomiting and Other (See Comments)    Caused GI upset    Antimicrobials this admission: 9/26 Vancomycin >>   9/26 Cefepime >>    Dose adjustments this admission:    Microbiology results: 9/26 Wound Cx:   9/26 MRSA PCR:    Thank you for allowing pharmacy to be a part of this patient's care.  Maryellen Pile, PharmD 09/29/2022 12:29 AM

## 2022-09-29 NOTE — Assessment & Plan Note (Signed)
Repleted with oral kcl resolved

## 2022-09-29 NOTE — Assessment & Plan Note (Signed)
Chronic. BMI 44.6

## 2022-09-29 NOTE — Hospital Course (Signed)
HPI: Shelby Wiley is a 87 y.o. female with medical history significant of chronic leg wounds, lymphedema, HTN     Presented with bilateral leg wounds Has had Unna boots on for the past 2 months Lives at home alone  Evidence of severe wounds  In the past was seen by [facility] vein had been managing them but they switched suppliers so she stopped seeing him  The wound started to bleed thorough she went to PCP and was seen was sent to ER No fever no chills  Significant Events: Admitted 09/28/2022 for bilateral leg cellulitis, new afib   Significant Labs: Discharge BUN 32, Scr 0.68, HgB 11.7, WBC 7.0  Significant Imaging Studies: Admission R/L tib/fib x-rays negative for osteomyelitis ABI shows normal ratios Echo 09-30-2022 shows LVEF 60-65%, moderate AS  Antibiotic Therapy: Anti-infectives (From admission, onward)    Start     Dose/Rate Route Frequency Ordered Stop   09/29/22 2359  vancomycin (VANCOREADY) IVPB 1250 mg/250 mL  Status:  Discontinued        1,250 mg 166.7 mL/hr over 90 Minutes Intravenous Every 24 hours 09/29/22 0029 09/30/22 1101   09/29/22 0900  ceFEPIme (MAXIPIME) 2 g in sodium chloride 0.9 % 100 mL IVPB  Status:  Discontinued        2 g 200 mL/hr over 30 Minutes Intravenous Every 8 hours 09/29/22 0042 09/30/22 1101   09/29/22 0115  vancomycin (VANCOREADY) IVPB 1500 mg/300 mL        1,500 mg 150 mL/hr over 120 Minutes Intravenous STAT 09/29/22 0026 09/29/22 0820   09/28/22 2315  ceFEPIme (MAXIPIME) 2 g in sodium chloride 0.9 % 100 mL IVPB        2 g 200 mL/hr over 30 Minutes Intravenous  Once 09/28/22 2313 09/29/22 0159   09/28/22 2200  vancomycin (VANCOCIN) IVPB 1000 mg/200 mL premix        1,000 mg 200 mL/hr over 60 Minutes Intravenous  Once 09/28/22 2148 09/29/22 0820       Procedures:   Consultants: Wound care

## 2022-09-29 NOTE — Plan of Care (Signed)
  Problem: Clinical Measurements: Goal: Ability to avoid or minimize complications of infection will improve Outcome: Not Progressing   Problem: Skin Integrity: Goal: Skin integrity will improve Outcome: Not Progressing   Problem: Education: Goal: Knowledge of General Education information will improve Description: Including pain rating scale, medication(s)/side effects and non-pharmacologic comfort measures Outcome: Progressing   Problem: Health Behavior/Discharge Planning: Goal: Ability to manage health-related needs will improve Outcome: Not Progressing   Problem: Coping: Goal: Level of anxiety will decrease Outcome: Not Progressing

## 2022-09-29 NOTE — Evaluation (Signed)
Occupational Therapy Evaluation Patient Details Name: Shelby Wiley MRN: [id number] DOB: 1935/08/01 Today's Date: 09/29/2022   History of Present Illness 87 y.o. female with medical history significant of chronic leg wounds, lymphedema, HTN.  Presented with bilateral leg wounds. Dx of cellulitis.   Clinical Impression   Pt admitted with the above diagnosis and has the deficits outlined below. Pt would benefit from cont OT to increase independence with basic adls back to her baseline. At baseline (1-2 days ago) pt reports being able to dress self, walk with cane or walker in home, sponge bathe and tend to toileting needs using depends. Today, pt is unable to sit on the EOB without support and is unable to attempt standing bc sitting balance is so poor. Recommend getting pt OOB with maxi sky lift (in room) and mobilizing as soon as possible before pt becomes weaker.  Will continue to see and talk to PT about co tx to get pt safely on feet as soon as possible.  Pt is not safe to go home at this point bc she is unable to mobilize and lives alone therefore currently recommending <3 hours of therapy a day prior to returning to her home.        If plan is discharge home, recommend the following: Two people to help with walking and/or transfers;Two people to help with bathing/dressing/bathroom;Assistance with cooking/housework;Assist for transportation;Help with stairs or ramp for entrance    Functional Status Assessment  Patient has had a recent decline in their functional status and demonstrates the ability to make significant improvements in function in a reasonable and predictable amount of time.  Equipment Recommendations  None recommended by OT    Recommendations for Other Services       Precautions / Restrictions Precautions Precautions: Fall Precaution Comments: 1 fall in past 6 months Restrictions Weight Bearing Restrictions: No Other Position/Activity Restrictions: B leg wraps       Mobility Bed Mobility Overal bed mobility: Needs Assistance Bed Mobility: Supine to Sit, Sit to Supine     Supine to sit: Max assist, +2 for physical assistance Sit to supine: Total assist, +2 for physical assistance   General bed mobility comments: Pt initiated moving legs to EOB and assisted to get to full sit pushing on elbow. Pt required helicopter turn total assist back into bed.    Transfers                   General transfer comment: Pt unable to stand or attempt to transfer at this time.      Balance Overall balance assessment: History of Falls, Needs assistance Sitting-balance support: Feet supported, Bilateral upper extremity supported Sitting balance-Leahy Scale: Poor Sitting balance - Comments: pt with posterior lean Postural control: Posterior lean   Standing balance-Leahy Scale: Zero Standing balance comment: unable to stand                           ADL either performed or assessed with clinical judgement   ADL Overall ADL's : Needs assistance/impaired Eating/Feeding: Set up;Sitting   Grooming: Set up;Sitting   Upper Body Bathing: Minimal assistance;Sitting   Lower Body Bathing: Total assistance;Bed level   Upper Body Dressing : Minimal assistance;Sitting   Lower Body Dressing: Total assistance;+2 for physical assistance   Toilet Transfer: Total assistance;+2 for physical assistance   Toileting- Clothing Manipulation and Hygiene: Total assistance;+2 for physical assistance       Functional mobility during ADLs:  Maximal assistance;+2 for physical assistance General ADL Comments: Pt limited with all adls due to general weakness. Pt unable to hold self up on EOB for more than a few seconds at a time without assist. Pt unable to stand due to weakness.     Vision Baseline Vision/History: 0 No visual deficits Ability to See in Adequate Light: 0 Adequate Patient Visual Report: No change from baseline Vision Assessment?: No  apparent visual deficits     Perception Perception: Within Functional Limits       Praxis Praxis: WFL       Pertinent Vitals/Pain Pain Assessment Pain Assessment: No/denies pain     Extremity/Trunk Assessment Upper Extremity Assessment Upper Extremity Assessment: Generalized weakness   Lower Extremity Assessment Lower Extremity Assessment: Defer to PT evaluation   Cervical / Trunk Assessment Cervical / Trunk Assessment: Kyphotic   Communication Communication Communication: Other (comment) (hyperverbal) Cueing Techniques: Verbal cues   Cognition Arousal: Alert Behavior During Therapy: Anxious Overall Cognitive Status: Within Functional Limits for tasks assessed                                 General Comments: Feel pt is most likely at baseline. Pt is hyperverbal and easily distracted and at times difficult to redirect.     General Comments  Pt very weak overall. Pt did walk into clinic yesterday so feel it is imperitive to mobilize pt as soon as possible before pt is too weak to move.    Exercises     Shoulder Instructions      Home Living Family/patient expects to be discharged to:: Private residence Living Arrangements: Alone Available Help at Discharge: Available PRN/intermittently;Family Type of Home: House Home Access: Stairs to enter [employer] of Steps: 3   Home Layout: One level     Bathroom Shower/Tub: Walk-in shower;Sponge bathes at baseline   [employer]: Handicapped height     Home Equipment: Information systems manager;Toilet riser;Cane - single point;Rolling Walker (2 wheels)          Prior Functioning/Environment Prior Level of Function : Independent/Modified Independent;History of Falls (last six months);Driving             Mobility Comments: walks with SPC or RW ADLs Comments: sponge bathes bc of wraps on BLEs; independent sponge bathing and dressing; friend brings groceries        OT Problem List: Decreased  strength;Decreased activity tolerance;Impaired balance (sitting and/or standing);Decreased knowledge of use of DME or AE;Obesity;Increased edema      OT Treatment/Interventions: Self-care/ADL training;Therapeutic activities;Balance training;DME and/or AE instruction    OT Goals(Current goals can be found in the care plan section) Acute Rehab OT Goals Patient Stated Goal: to go home soon OT Goal Formulation: With patient Time For Goal Achievement: 10/13/22 Potential to Achieve Goals: Fair ADL Goals Pt Will Perform Lower Body Bathing: with min assist;sit to/from stand Pt Will Perform Lower Body Dressing: with min assist;sit to/from stand Pt Will Transfer to Toilet: with min assist;ambulating Pt Will Perform Toileting - Clothing Manipulation and hygiene: with min assist;sit to/from stand Additional ADL Goal #1: Pt will tolerate standing EOB with mod assist in prep for adls on EOB  OT Frequency: Min 1X/week    Co-evaluation              AM-PAC OT "6 Clicks" Daily Activity     Outcome Measure Help from another person eating meals?: A Little Help from another person  taking care of personal grooming?: A Little Help from another person toileting, which includes using toliet, bedpan, or urinal?: Total Help from another person bathing (including washing, rinsing, drying)?: A Lot Help from another person to put on and taking off regular upper body clothing?: A Little Help from another person to put on and taking off regular lower body clothing?: A Lot 6 Click Score: 14   End of Session Equipment Utilized During Treatment: Other (comment) (leg wraps) Nurse Communication: Mobility status  Activity Tolerance: Patient limited by fatigue Patient left: in bed;with call bell/phone within reach;with bed alarm set  OT Visit Diagnosis: Muscle weakness (generalized) (M62.81);History of falling (Z91.81)                Time: 9528-4132 OT Time Calculation (min): 32 min Charges:  OT General  Charges $OT Visit: 1 Visit OT Evaluation $OT Eval Moderate Complexity: 1 Mod  Jaclynn, Laumann 09/29/2022, 11:30 AM

## 2022-09-29 NOTE — Assessment & Plan Note (Signed)
Stable

## 2022-09-30 ENCOUNTER — Inpatient Hospital Stay (HOSPITAL_COMMUNITY): Payer: Medicare Other

## 2022-09-30 DIAGNOSIS — I890 Lymphedema, not elsewhere classified: Secondary | ICD-10-CM | POA: Diagnosis not present

## 2022-09-30 DIAGNOSIS — E876 Hypokalemia: Secondary | ICD-10-CM | POA: Diagnosis not present

## 2022-09-30 DIAGNOSIS — L03116 Cellulitis of left lower limb: Secondary | ICD-10-CM | POA: Diagnosis not present

## 2022-09-30 DIAGNOSIS — I4891 Unspecified atrial fibrillation: Secondary | ICD-10-CM | POA: Diagnosis not present

## 2022-09-30 DIAGNOSIS — I480 Paroxysmal atrial fibrillation: Secondary | ICD-10-CM | POA: Diagnosis not present

## 2022-09-30 LAB — ECHOCARDIOGRAM COMPLETE
AR max vel: 0.78 cm2
AV Area VTI: 0.7 cm2
AV Area mean vel: 0.68 cm2
AV Mean grad: 22.5 mm[Hg]
AV Peak grad: 37.3 mm[Hg]
Ao pk vel: 3.06 m/s
Area-P 1/2: 3.31 cm2
Height: 65 in
MV M vel: 3 m/s
MV Peak grad: 36 mm[Hg]
MV VTI: 1.48 cm2
S' Lateral: 3.1 cm
Weight: 4288 [oz_av]

## 2022-09-30 LAB — TSH: TSH: 2.446 u[IU]/mL (ref 0.350–4.500)

## 2022-09-30 MED ORDER — POTASSIUM CHLORIDE CRYS ER 20 MEQ PO TBCR
40.0000 meq | EXTENDED_RELEASE_TABLET | ORAL | Status: AC
  Administered 2022-09-30 (×2): 40 meq via ORAL
  Filled 2022-09-30 (×2): qty 2

## 2022-09-30 MED ORDER — CARVEDILOL 3.125 MG PO TABS
3.1250 mg | ORAL_TABLET | Freq: Two times a day (BID) | ORAL | Status: DC
  Administered 2022-09-30 – 2022-10-03 (×6): 3.125 mg via ORAL
  Filled 2022-09-30 (×6): qty 1

## 2022-09-30 NOTE — Progress Notes (Signed)
PROGRESS NOTE    Shelby Wiley  ZOX:[id number] DOB: [birth year] DOA: 09/28/2022 PCP: Clayborn Heron, MD  Subjective: Pt seen and examined. Lives alone. Has been non-compliant with f/u for her unna boots. Wound care RN at bedside.  She has removed most of the old unna boot wraps. Skin underneath wraps looks healthy in most spots. There are obvious compression ulcers due to unna boot on the left leg. See pictures. Pt very talkative.  Likes to repeat the phrase "from your lips to God's ears" a lot.   Hospital Course: HPI: Shelby Wiley is a 87 y.o. female with medical history significant of chronic leg wounds, lymphedema, HTN     Presented with bilateral leg wounds Has had Unna boots on for the past 2 months Lives at home alone  Evidence of severe wounds  In the past was seen by [facility] had been managing them but they switched suppliers so she stopped seeing him  The wound started to bleed thorough she went to PCP and was seen was sent to ER No fever no chills  Significant Events: Admitted 09/28/2022 for bilateral leg cellulitis, new afib   Significant Labs:   Significant Imaging Studies: Admission R/L tib/fib x-rays negative for osteomyelitis ABI shows normal ratios  Antibiotic Therapy: Anti-infectives (From admission, onward)    Start     Dose/Rate Route Frequency Ordered Stop   09/29/22 2359  vancomycin (VANCOREADY) IVPB 1250 mg/250 mL  Status:  Discontinued        1,250 mg 166.7 mL/hr over 90 Minutes Intravenous Every 24 hours 09/29/22 0029 09/30/22 1101   09/29/22 0900  ceFEPIme (MAXIPIME) 2 g in sodium chloride 0.9 % 100 mL IVPB  Status:  Discontinued        2 g 200 mL/hr over 30 Minutes Intravenous Every 8 hours 09/29/22 0042 09/30/22 1101   09/29/22 0115  vancomycin (VANCOREADY) IVPB 1500 mg/300 mL        1,500 mg 150 mL/hr over 120 Minutes Intravenous STAT 09/29/22 0026 09/29/22 0820   09/28/22 2315  ceFEPIme (MAXIPIME) 2 g in sodium chloride  0.9 % 100 mL IVPB        2 g 200 mL/hr over 30 Minutes Intravenous  Once 09/28/22 2313 09/29/22 0159   09/28/22 2200  vancomycin (VANCOCIN) IVPB 1000 mg/200 mL premix        1,000 mg 200 mL/hr over 60 Minutes Intravenous  Once 09/28/22 2148 09/29/22 0820       Procedures:   Consultants: Wound care    Assessment and Plan: * Bilateral cellulitis of lower leg Admitted for bilateral leg cellulitis. Appears that her unna boots have been left on both lower legs for a prolonged period of time.  On 09-29-2022 Wound care has removed most of the old wrappings. Some old wrapping adherent to lower leg skin. Have asked wound care to place saline soaked kerlex gauze around lower legs to soften the old wrappings so they are easier to remove on afternoon of 09-28-2025. 09-30-2022 Legs continue to look better after rest of old dressings were removed.  Procalcitonin is negative. Doubting she had cellulitis when she was admitted. Probably just rotting old dressing. Will stop IV ABX.  See pictures  Admission 09-28-2022 lower legs 09-29-2022 lower legs 09-30-2022                A-fib (HCC) New onset. Tsh is normal. Keep K >4.0 and Mg >2.0 CHAD-VASC is 6 Initially started on IV heparin  on admission. Started Eliquis on 09-29-2022. Awaiting echo Repeat EKG  Lymphedema Chronic. Pt will need SNF placement due to wounds on her legs.  Probably better to use ACE wraps rather than unna boots due to pt's non-compliance and lack of followup.  Hypokalemia Repleted with oral kcl resolved  HYPERTENSION, BORDERLINE Stable.  OBESITY, MORBID Chronic. BMI 44.6   DVT prophylaxis:  apixaban (ELIQUIS) tablet 5 mg     Code Status: Full Code Family Communication: no family at bedside Disposition Plan: SNF placement Reason for continuing need for hospitalization: medically stable for discharge. Awaiting SNF placement/bed search by CM.  Objective: Vitals:   09/29/22 0757 09/29/22 1200 09/29/22 1957 09/30/22  0359  BP: 108/68 129/73 118/65 (!) 103/59  Pulse: 87 99 95 99  Resp: 15 16 18 18   Temp: 98.4 F (36.9 C) 98.1 F (36.7 C) 98.3 F (36.8 C) 98 F (36.7 C)  TempSrc: Oral  Oral Oral  SpO2: 100% 96% 98% 96%  Weight:      Height:        Intake/Output Summary (Last 24 hours) at 09/30/2022 1107 Last data filed at 09/30/2022 0732 Gross per 24 hour  Intake 209.61 ml  Output 2350 ml  Net -2140.39 ml   Filed Weights   09/28/22 2345  Weight: 121.6 kg    Examination:  Physical Exam Vitals and nursing note reviewed.  Constitutional:      General: She is not in acute distress.    Appearance: She is not toxic-appearing.     Comments: Chronically ill appearing  HENT:     Nose: Nose normal.  Eyes:     General: No scleral icterus. Cardiovascular:     Rate and Rhythm: Normal rate. Rhythm irregular.  Pulmonary:     Effort: Pulmonary effort is normal.     Breath sounds: Normal breath sounds.  Abdominal:     General: Abdomen is protuberant. There is no distension.     Palpations: Abdomen is soft.     Tenderness: There is no abdominal tenderness.  Musculoskeletal:     Comments: See pictures of lower legs bilaterally I removed prior dressing. Placed new dressing with xeroform on open areas of denuded skin. Wrapped with kerlex gauge and wrapped again with ACE bandages.  Skin:    General: Skin is warm and dry.     Capillary Refill: Capillary refill takes less than 2 seconds.     Findings: Lesion present.  Neurological:     Mental Status: She is alert and oriented to person, place, and time.     Data Reviewed: I have personally reviewed following labs and imaging studies  CBC: Recent Labs  Lab 09/28/22 1828 09/29/22 0532  WBC 10.3 8.0  NEUTROABS 7.4  --   HGB 12.4 11.7*  HCT 38.6 37.2  MCV 97.5 101.6*  PLT 220 178   Basic Metabolic Panel: Recent Labs  Lab 09/28/22 1828 09/28/22 2248 09/29/22 0532  NA 139  --  137  K 3.4*  --  3.5  CL 101  --  105  CO2 29  --  26   GLUCOSE 109*  --  87  BUN 11  --  10  CREATININE 0.68  --  0.58  CALCIUM 8.6*  --  8.0*  MG  --  2.2 2.1  PHOS  --  2.9 2.6   GFR: Estimated Creatinine Clearance: 68.4 mL/min (by C-G formula based on SCr of 0.58 mg/dL). Liver Function Tests: Recent Labs  Lab 09/28/22 1828 09/29/22  0532  AST 11* 9*  ALT 7 6  ALKPHOS 83 68  BILITOT 1.0 0.7  PROT 6.9 5.4*  ALBUMIN 3.2* 2.4*   Coagulation Profile: Recent Labs  Lab 09/29/22 0532  INR 1.2   Cardiac Enzymes: Recent Labs  Lab 09/28/22 2248  CKTOTAL 34*   HbA1C: Recent Labs    09/28/22 2249  HGBA1C 5.2   Thyroid Function Tests: Recent Labs    09/30/22 0648  TSH 2.446   Sepsis Labs: Recent Labs  Lab 09/28/22 1834 09/28/22 2124 09/28/22 2248  PROCALCITON  --   --  <0.10  LATICACIDVEN 0.6 1.2  --     Recent Results (from the past 240 hour(s))  Aerobic Culture w Gram Stain (superficial specimen)     Status: None (Preliminary result)   Collection Time: 09/28/22 10:47 PM   Specimen: Wound  Result Value Ref Range Status   Specimen Description   Final    WOUND LEFT LOWER LEG Performed at [facility], [address] [place], [place] [address]    Special Requests   Final    NONE Performed at [facility], [address], [place], [place] [address]    Gram Stain   Final    FEW WBC PRESENT, PREDOMINANTLY MONONUCLEAR FEW GRAM POSITIVE COCCI IN PAIRS RARE GRAM NEGATIVE RODS Performed at [facility], [address], [place], [place] [address]    Culture PENDING  Incomplete   Report Status PENDING  Incomplete  MRSA Next Gen by PCR, Nasal     Status: None   Collection Time: 09/29/22  2:26 AM   Specimen: Nasal Mucosa; Nasal Swab  Result Value Ref Range Status   MRSA by PCR Next Gen NOT DETECTED NOT DETECTED Final    Comment: (NOTE) The GeneXpert MRSA Assay (FDA approved for NASAL specimens only), is one component of a comprehensive MRSA colonization  surveillance program. It is not intended to diagnose MRSA infection nor to guide or monitor treatment for MRSA infections. Test performance is not FDA approved in patients less than 4 years old. Performed at [facility], [address] [place] [address], [place], [place] [address]    Radiology Studies: VAS [place] ABI WITH/WO TBI  Result Date: 09/29/2022  LOWER EXTREMITY DOPPLER STUDY Patient Name:  ROGUE PAUTLER  Date of Exam:   09/29/2022 Medical Rec #: [id number]      Accession #:    [id number] Date of Birth: 24-Sep-1935     Patient Gender: F Patient Age:   41 years Exam Location:  [facility] Procedure:      VAS [place] ABI WITH/WO TBI Referring Phys: Jonny Ruiz DOUTOVA --------------------------------------------------------------------------------  Indications: BLE wounds (cellulitis) High Risk Factors: Hypertension, past history of smoking. Other Factors: TIA, lymphedema, venous insufficiency.  Limitations: Today's exam was limited due to bandages , an open wound and              patient condition. Comparison Study: No previous exams Performing Technologist: Hill, Jody RVT, RDMS  Examination Guidelines: A complete evaluation includes at minimum, Doppler waveform signals and systolic blood pressure reading at the level of bilateral brachial, anterior tibial, and posterior tibial arteries, when vessel segments are accessible. Bilateral testing is considered an integral part of a complete examination. Photoelectric Plethysmograph (PPG) waveforms and toe systolic pressure readings are included as required and additional duplex testing as needed. Limited examinations for reoccurring indications may be performed as noted.  ABI Findings: +---------+------------------+-----+-----------+--------+ Right    Rt  Pressure (mmHg)IndexWaveform   Comment  +---------+------------------+-----+-----------+--------+ Brachial 96                     triphasic            +---------+------------------+-----+-----------+--------+ PTA      112               1.17 multiphasic         +---------+------------------+-----+-----------+--------+ DP       123               1.28 multiphasic         +---------+------------------+-----+-----------+--------+ Great Toe74                0.77 Normal              +---------+------------------+-----+-----------+--------+ +---------+------------------+-----+-----------+-------+ Left     Lt Pressure (mmHg)IndexWaveform   Comment +---------+------------------+-----+-----------+-------+ Brachial 96                                        +---------+------------------+-----+-----------+-------+ PTA      117               1.22 multiphasic        +---------+------------------+-----+-----------+-------+ DP       105               1.09 multiphasic        +---------+------------------+-----+-----------+-------+ Great Toe67                0.70 Normal             +---------+------------------+-----+-----------+-------+  Difficulty obtaining doppler waveforms due to tachycardia and afib.  Summary: Right: Resting right ankle-brachial index is within normal range. The right toe-brachial index is normal. Left: Resting left ankle-brachial index is within normal range. The left toe-brachial index is normal. *See table(s) above for measurements and observations.  Electronically signed by Gerarda Fraction on 09/29/2022 at 2:57:25 PM.    Final    DG Tibia/Fibula Right  Result Date: 09/28/2022 CLINICAL DATA:  Infection, bleeding from legs. Best obtainable images EXAM: RIGHT TIBIA AND FIBULA - 2 VIEW; RIGHT FOOT COMPLETE - 3+ VIEW COMPARISON:  Radiographs 07/08/2009 FINDINGS: Right TKA. No acute fracture or dislocation. Diffuse soft tissue swelling about the right lower extremity and foot. Overlying gauze/bandage material obscures detail at the ankle and midfoot. No definite osteomyelitis. IMPRESSION: Diffuse soft tissue  swelling about the right lower extremity and foot. No definite osteomyelitis. Electronically Signed   By: Minerva Fester M.D.   On: 09/28/2022 20:07   DG Foot Complete Right  Result Date: 09/28/2022 CLINICAL DATA:  Infection, bleeding from legs. Best obtainable images EXAM: RIGHT TIBIA AND FIBULA - 2 VIEW; RIGHT FOOT COMPLETE - 3+ VIEW COMPARISON:  Radiographs 07/08/2009 FINDINGS: Right TKA. No acute fracture or dislocation. Diffuse soft tissue swelling about the right lower extremity and foot. Overlying gauze/bandage material obscures detail at the ankle and midfoot. No definite osteomyelitis. IMPRESSION: Diffuse soft tissue swelling about the right lower extremity and foot. No definite osteomyelitis. Electronically Signed   By: Minerva Fester M.D.   On: 09/28/2022 20:07   DG Tibia/Fibula Left  Result Date: 09/28/2022 CLINICAL DATA:  Bleeding from legs.  Infection. EXAM: LEFT TIBIA AND FIBULA - 2 VIEW; LEFT FOOT - COMPLETE 3+ VIEW COMPARISON:  None Available. FINDINGS: Advanced arthritis left knee with near complete loss of medial joint  space. No acute fracture or dislocation. Soft tissue swelling about the left lower extremity and foot. Overlying gauze obscures fine detail in the lower extremity and foot. No definite osteomyelitis. IMPRESSION: Soft tissue swelling about the left lower extremity and foot. No definite osteomyelitis. Electronically Signed   By: Minerva Fester M.D.   On: 09/28/2022 20:05   DG Foot Complete Left  Result Date: 09/28/2022 CLINICAL DATA:  Bleeding from legs.  Infection. EXAM: LEFT TIBIA AND FIBULA - 2 VIEW; LEFT FOOT - COMPLETE 3+ VIEW COMPARISON:  None Available. FINDINGS: Advanced arthritis left knee with near complete loss of medial joint space. No acute fracture or dislocation. Soft tissue swelling about the left lower extremity and foot. Overlying gauze obscures fine detail in the lower extremity and foot. No definite osteomyelitis. IMPRESSION: Soft tissue swelling  about the left lower extremity and foot. No definite osteomyelitis. Electronically Signed   By: Minerva Fester M.D.   On: 09/28/2022 20:05    Scheduled Meds:  apixaban  5 mg Oral BID   ascorbic acid  500 mg Oral Daily   nutrition supplement (JUVEN)  1 packet Oral BID BM   potassium chloride  40 mEq Oral Q4H   zinc sulfate  220 mg Oral Daily   Continuous Infusions:   LOS: 2 days   Time spent: 40 minutes  Carollee Herter, DO  [facility]  09/30/2022, 11:07 AM

## 2022-09-30 NOTE — TOC Progression Note (Signed)
Transition of Care [facility]) - Progression Note    Patient Details  Name: Shelby Wiley MRN: [id number] Date of Birth: 11-28-1935  [facility] CM/SW Contact  Carmina Miller, LCSWA Phone Number: 09/30/2022, 11:53 AM  Clinical Narrative:     CSW spoke with pt concerning SNF recs, pt agreeable and admits she doesn't have any direct family or friends to assist in the home. Pt states she has one friend that checks on her periodically but he lives in another town. Pt states she is the youngest of 57 children and all her family is deceased. CSW explained insurance auth process. TOC will continue to follow.        Expected Discharge Plan and Services                                               Social Determinants of Health (SDOH) Interventions SDOH Screenings   Food Insecurity: No Food Insecurity (09/29/2022)  Housing: Low Risk  (09/29/2022)  Transportation Needs: No Transportation Needs (09/29/2022)  Utilities: Not At Risk (09/29/2022)  Tobacco Use: Medium Risk (09/28/2022)    Readmission Risk Interventions    09/29/2022    4:02 PM  Readmission Risk Prevention Plan  Post Dischage Appt Complete  Medication Screening Complete  Transportation Screening Complete

## 2022-09-30 NOTE — Plan of Care (Signed)
  Problem: Clinical Measurements: Goal: Ability to avoid or minimize complications of infection will improve Outcome: Progressing   Problem: Skin Integrity: Goal: Skin integrity will improve Outcome: Progressing   Problem: Education: Goal: Knowledge of General Education information will improve Description: Including pain rating scale, medication(s)/side effects and non-pharmacologic comfort measures Outcome: Progressing   Problem: Health Behavior/Discharge Planning: Goal: Ability to manage health-related needs will improve Outcome: Progressing   

## 2022-09-30 NOTE — Progress Notes (Signed)
Physical Therapy Treatment Patient Details Name: Shelby Wiley MRN: [id number] DOB: 09-17-1935 Today's Date: 09/30/2022   History of Present Illness 87 y.o. female with medical history significant of chronic leg wounds, lymphedema, HTN.  Presented with bilateral leg wounds. Dx of cellulitis.    PT Comments  Pt very chatty during session ( as noted from previous visit to be hyper verbal and difficult to redirect). I was able to keep patient on task, however most challenging part of session was patients difficulty with hip flexion for upright sitting and prep for sit to stand and stand to sit. She has a great fear of falling and with all the time and visual demonstration, tactile cues, and verbal cues still difficult for forward flexion at hips. Padded shin area on sara stedy to sit to stand, however pt not even able to flex at hips sitting enough to grab bar in front of her, had to grab it once standing in the stedy. Once standing, she was able to hold here own weight upright and felt " safe" in the stedy without even using the seat. We stood for 5 minutes today before sitting in recliner.   Pt was happy to be in the recliner, but seems to have limited awareness of her deficits and how to relates to holding her mobility back. Also , unclear of her accuracy of independency with ambulation and taking care of cats 3 days ago with her current state.   Feels more secure and safe with 2 people assisting and clear instructions before you move. Pt is very motivated.    If plan is discharge home, recommend the following: Two people to help with bathing/dressing/bathroom;Two people to help with walking and/or transfers;Assistance with cooking/housework;Assist for transportation;Help with stairs or ramp for entrance   Can travel by private vehicle     No  Equipment Recommendations  Other (comment) (TBD at SNF)    Recommendations for Other Services       Precautions / Restrictions  Precautions Precautions: Fall Precaution Comments: also B LE wrapped for wound care adn swelling. 1 fall in past 6 months, unsure of pt's accuracy of history and PLOF Restrictions Weight Bearing Restrictions: No Other Position/Activity Restrictions: B leg wraps     Mobility  Bed Mobility Overal bed mobility: Needs Assistance Bed Mobility: Supine to Sit, Sit to Supine     Supine to sit: Max assist, +2 for physical assistance, Mod assist (cues for " what to do next " as the patien asks, more assistance needed for upper body than lower body)          Transfers Overall transfer level: Needs assistance Equipment used:  (sarah stedy) Transfers: Sit to/from Stand Sit to Stand: +2 physical assistance, Mod assist           General transfer comment: Most difficult part of the transfer was getting patient to sit more upright wiht flexion at her hips in order to lean forward and reach the bar of the stedy. Pt was very anxious and worried about everything ( lines, feet, falling, etc) however with instructiona nd directions for what you want her to do and what you are going to do each move fo the way helped to ease her anxiety about the transfer. Once standing up in stedy , she was supervision and also able to stand and hold her own weight without sitting on the stedy chair. But hip flexion to transition sit to stand and stand to sit is the most challenging. Pt  transitioned to sit int eh recliner today for hte first time. Transfer via Lift Equipment: Stedy  Ambulation/Gait                   Stairs             Wheelchair Mobility     Tilt Bed    Modified Rankin (Stroke Patients Only)       Balance Overall balance assessment: History of Falls, Needs assistance Sitting-balance support: Feet supported, Bilateral upper extremity supported Sitting balance-Leahy Scale: Poor Sitting balance - Comments: pt with posterior lean requiring min to max assist to maintain neutral,  due to fear of falling with forward flexion of hips. This was very challenging, but improved over time. Sat edge of bed working on this for over 8 minutes. Postural control: Posterior lean Standing balance support: During functional activity, Reliant on assistive device for balance Standing balance-Leahy Scale: Poor Standing balance comment: stand was in the stedy lift equipment                            Cognition Arousal: Alert Behavior During Therapy: Anxious Overall Cognitive Status: Within Functional Limits for tasks assessed                                 General Comments: Pt is hyperverbal and easily distracted and at times difficult to redirect.        Exercises      General Comments        Pertinent Vitals/Pain Pain Assessment Pain Assessment: No/denies pain (did not report any pain during the session)    Home Living                          Prior Function            PT Goals (current goals can now be found in the care plan section) Acute Rehab PT Goals Patient Stated Goal: knows she needs to go to rehab but ultimate goal is to return home . She wants to see her cat. PT Goal Formulation: With patient Time For Goal Achievement: 10/13/22 Potential to Achieve Goals: Good Progress towards PT goals: Progressing toward goals    Frequency    Min 1X/week      PT Plan      Co-evaluation              AM-PAC PT "6 Clicks" Mobility   Outcome Measure  Help needed turning from your back to your side while in a flat bed without using bedrails?: Total Help needed moving from lying on your back to sitting on the side of a flat bed without using bedrails?: Total Help needed moving to and from a bed to a chair (including a wheelchair)?: Total Help needed standing up from a chair using your arms (e.g., wheelchair or bedside chair)?: Total Help needed to walk in hospital room?: Total Help needed climbing 3-5 steps with a  railing? : Total 6 Click Score: 6    End of Session   Activity Tolerance: Other (comment) (pt was very excited she had accomplished OOB to the recliner., but very anxious along the way. Fear of falling is high) Patient left: in chair;with call bell/phone within reach (instructed how to call for nurse to not get up or move alone, and she said " I will  not, I promise you , I am scared of falling , I will not move without help") Nurse Communication: Mobility status;Need for lift equipment (stedy, pad shin area with 2 pillows between equipment and pt's shins) PT Visit Diagnosis: Muscle weakness (generalized) (M62.81);Other abnormalities of gait and mobility (R26.89);Difficulty in walking, not elsewhere classified (R26.2)     Time: 1610-9604 PT Time Calculation (min) (ACUTE ONLY): 37 min  Charges:    $Therapeutic Activity: 23-37 mins PT General Charges $$ ACUTE PT VISIT: 1 Visit                     Leanora Murin, PT, MPT Acute Rehabilitation Services Office: [phone] If a weekend: secure chat groups: [facility] PT, [facility] OT, [facility] SLP 09/30/2022    Arnitra Sokoloski, [facility] 09/30/2022, 12:57 PM

## 2022-09-30 NOTE — Progress Notes (Signed)
   Repeat EKG shows continued afib. Still awaiting echo. Will start coreg 3.125 mg bid. Hold for SBP <120 or HR <60     Carollee Herter, DO [facility]

## 2022-09-30 NOTE — Progress Notes (Signed)
Attempted Echocardiogram, Physical Therapy is in progress and patient care will follow. Nurse asked to come back at a later.

## 2022-09-30 NOTE — [place] FL2 (Signed)
[place] MEDICAID FL2 LEVEL OF CARE FORM     IDENTIFICATION  Patient Name: Shelby Wiley Birthdate: Dec 07, 1935 Sex: female Admission Date (Current Location): 09/28/2022  [facility] and [place] Number:  Producer, television/film/video and Address:  [facility], [address] [place], [place] [address]      Provider Number: [id number]  Attending Physician Name and Address:  Carollee Herter, DO  Relative Name and Phone Number:       Current Level of Care: Hospital Recommended Level of Care: Skilled Nursing Facility Prior Approval Number:    Date Approved/Denied:   PASRR Number: [id number]  Discharge Plan: SNF    Current Diagnoses: Patient Active Problem List   Diagnosis Date Noted   Bilateral cellulitis of lower leg 09/28/2022   A-fib (HCC) 09/28/2022   Pain due to onychomycosis of toenails of both feet 05/27/2019   Venous (peripheral) insufficiency 05/27/2019   Lymphedema 05/27/2019   Hypokalemia 04/20/2011   OA (osteoarthritis) of knee 04/17/2011   TIA 11/02/2009   HYPERGLYCEMIA, BORDERLINE 07/20/2009   EYE FLOATERS, LEFT 07/02/2009   FATIGUE 07/02/2009   WART, VIRAL 11/20/2008   Vitreous degeneration 11/20/2008   HEMORRHOIDS, INTERNAL 06/25/2008   EXTERNAL HEMORRHOIDS 06/25/2008   KERATOSIS PILARIS 06/25/2008   HYPERTENSION, BORDERLINE 02/26/2007   SHOULDER PAIN, RIGHT 02/26/2007   OBESITY, MORBID 05/05/2006   INSUFFICIENCY, VENOUS NOS 05/05/2006   OSTEOARTHRITIS 05/05/2006   DIVERTICULOSIS, COLON 12/08/2003    Orientation RESPIRATION BLADDER Height & Weight     Self, Time, Situation, Place  Normal Incontinent, External catheter Weight: 268 lb (121.6 kg) Height:  5\' 5"  (165.1 cm)  BEHAVIORAL SYMPTOMS/MOOD NEUROLOGICAL BOWEL NUTRITION STATUS      Incontinent Diet (see dc summary)  AMBULATORY STATUS COMMUNICATION OF NEEDS Skin   Extensive Assist Verbally Other (Comment) (Open wound-pretibial R and L)                       Personal Care  Assistance Level of Assistance  Bathing, Feeding, Dressing Bathing Assistance: Limited assistance Feeding assistance: Independent Dressing Assistance: Limited assistance     Functional Limitations Info  Sight, Hearing, Speech Sight Info: Adequate Hearing Info: Adequate Speech Info: Adequate    SPECIAL CARE FACTORS FREQUENCY  PT (By licensed PT), OT (By licensed OT)     PT Frequency: 5x week OT Frequency: 5x week            Contractures Contractures Info: Not present    Additional Factors Info  Code Status, Allergies Code Status Info: full Allergies Info: codeine           Current Medications (09/30/2022):  This is the current hospital active medication list Current Facility-Administered Medications  Medication Dose Route Frequency Provider Last Rate Last Admin   acetaminophen (TYLENOL) tablet 650 mg  650 mg Oral Q6H PRN Doutova, Anastassia, MD       Or   acetaminophen (TYLENOL) suppository 650 mg  650 mg Rectal Q6H PRN Doutova, Anastassia, MD       apixaban (ELIQUIS) tablet 5 mg  5 mg Oral BID Carollee Herter, DO   5 mg at 09/30/22 5621   ascorbic acid (VITAMIN C) tablet 500 mg  500 mg Oral Daily Carollee Herter, DO   500 mg at 09/30/22 3086   HYDROcodone-acetaminophen (NORCO/VICODIN) 5-325 MG per tablet 1-2 tablet  1-2 tablet Oral Q4H PRN Therisa Doyne, MD       nutrition supplement (JUVEN) (JUVEN) powder packet 1 packet  1 packet Oral BID BM Carollee Herter, DO   1 packet at 09/30/22 0922   ondansetron (ZOFRAN) tablet 4 mg  4 mg Oral Q6H PRN Therisa Doyne, MD       Or   ondansetron (ZOFRAN) injection 4 mg  4 mg Intravenous Q6H PRN Doutova, Anastassia, MD       potassium chloride SA (KLOR-CON M) CR tablet 40 mEq  40 mEq Oral Q4H Carollee Herter, DO   40 mEq at 09/30/22 1127   zinc sulfate capsule 220 mg  220 mg Oral Daily Carollee Herter, DO   220 mg at 09/30/22 4098     Discharge Medications: Please see discharge summary for a list of discharge medications.  Relevant  Imaging Results:  Relevant Lab Results:   Additional Information SSN [id number]  Carmina Miller, LCSWA

## 2022-09-30 NOTE — Progress Notes (Signed)
Mobility Specialist - Progress Note   09/30/22 1531  Mobility  Activity Transferred from bed to chair  Level of Assistance +2 (takes two people)  Assistive Device [employer]/Exercises Active  Activity Response Tolerated fair  Mobility Referral Yes  $Mobility charge 1 Mobility  Mobility Specialist Start Time (ACUTE ONLY) 1505  Mobility Specialist Stop Time (ACUTE ONLY) 1530  Mobility Specialist Time Calculation (min) (ACUTE ONLY) 25 min   Pt received in chair and agreed to transfer.  Pt continues to not lean forward when attempting to use steady. Pt required Mod A to bring upper trunk towards grab bar. Once bar is grabbed pt requires little to no assistance standing.  Pt sat EOB and helicoptered back in bed with all needs met.  Marilynne Halsted Mobility Specialist

## 2022-09-30 NOTE — Progress Notes (Signed)
Echocardiogram 2D Echocardiogram has been performed.  Shelby Wiley 09/30/2022, 4:22 PM

## 2022-10-01 DIAGNOSIS — E876 Hypokalemia: Secondary | ICD-10-CM | POA: Diagnosis not present

## 2022-10-01 DIAGNOSIS — I890 Lymphedema, not elsewhere classified: Secondary | ICD-10-CM | POA: Diagnosis not present

## 2022-10-01 DIAGNOSIS — I480 Paroxysmal atrial fibrillation: Secondary | ICD-10-CM | POA: Diagnosis not present

## 2022-10-01 DIAGNOSIS — I350 Nonrheumatic aortic (valve) stenosis: Secondary | ICD-10-CM

## 2022-10-01 DIAGNOSIS — L03116 Cellulitis of left lower limb: Secondary | ICD-10-CM | POA: Diagnosis not present

## 2022-10-01 LAB — COMPREHENSIVE METABOLIC PANEL
ALT: <5 U/L (ref 0–44)
AST: 10 U/L — ABNORMAL LOW (ref 15–41)
Albumin: 2.4 g/dL — ABNORMAL LOW (ref 3.5–5.0)
Alkaline Phosphatase: 58 U/L (ref 38–126)
Anion gap: 5 (ref 5–15)
BUN: 20 mg/dL (ref 8–23)
CO2: 26 mmol/L (ref 22–32)
Calcium: 8.1 mg/dL — ABNORMAL LOW (ref 8.9–10.3)
Chloride: 104 mmol/L (ref 98–111)
Creatinine, Ser: 0.63 mg/dL (ref 0.44–1.00)
GFR, Estimated: >60 mL/min (ref >60)
Glucose, Bld: 91 mg/dL (ref 70–99)
Potassium: 5.1 mmol/L (ref 3.5–5.1)
Sodium: 135 mmol/L (ref 135–145)
Total Bilirubin: 0.8 mg/dL (ref 0.3–1.2)
Total Protein: 5.7 g/dL — ABNORMAL LOW (ref 6.5–8.1)

## 2022-10-01 LAB — CBC WITH DIFFERENTIAL/PLATELET
Abs Immature Granulocytes: 0.01 10*3/uL (ref 0.00–0.07)
Basophils Absolute: 0.1 10*3/uL (ref 0.0–0.1)
Basophils Relative: 1 %
Eosinophils Absolute: 0.4 10*3/uL (ref 0.0–0.5)
Eosinophils Relative: 6 %
HCT: 35 % — ABNORMAL LOW (ref 36.0–46.0)
Hemoglobin: 11.3 g/dL — ABNORMAL LOW (ref 12.0–15.0)
Immature Granulocytes: 0 %
Lymphocytes Relative: 25 %
Lymphs Abs: 1.7 10*3/uL (ref 0.7–4.0)
MCH: 32.4 pg (ref 26.0–34.0)
MCHC: 32.3 g/dL (ref 30.0–36.0)
MCV: 100.3 fL — ABNORMAL HIGH (ref 80.0–100.0)
Monocytes Absolute: 0.7 10*3/uL (ref 0.1–1.0)
Monocytes Relative: 10 %
Neutro Abs: 3.9 10*3/uL (ref 1.7–7.7)
Neutrophils Relative %: 58 %
Platelets: 208 10*3/uL (ref 150–400)
RBC: 3.49 MIL/uL — ABNORMAL LOW (ref 3.87–5.11)
RDW: 12.9 % (ref 11.5–15.5)
WBC: 6.9 10*3/uL (ref 4.0–10.5)
nRBC: 0 % (ref 0.0–0.2)

## 2022-10-01 LAB — MAGNESIUM: Magnesium: 2.2 mg/dL (ref 1.7–2.4)

## 2022-10-01 LAB — PROCALCITONIN: Procalcitonin: <0.1 ng/mL

## 2022-10-01 MED ORDER — ZINC SULFATE 220 (50 ZN) MG PO CAPS: 220.0000 mg | ORAL_CAPSULE | Freq: Every day | ORAL | Status: AC

## 2022-10-01 MED ORDER — JUVEN PO PACK: 1.0000 | PACK | Freq: Two times a day (BID) | ORAL | Status: AC

## 2022-10-01 MED ORDER — ASCORBIC ACID 500 MG PO TABS: 500.0000 mg | ORAL_TABLET | Freq: Every day | ORAL | Status: AC

## 2022-10-01 MED ORDER — APIXABAN 5 MG PO TABS: 5.0000 mg | ORAL_TABLET | Freq: Two times a day (BID) | ORAL | Status: AC

## 2022-10-01 MED ORDER — CARVEDILOL 3.125 MG PO TABS: 3.1250 mg | ORAL_TABLET | Freq: Two times a day (BID) | ORAL | Status: DC

## 2022-10-01 NOTE — Progress Notes (Signed)
Physical Therapy Treatment Patient Details Name: Shelby Wiley MRN: [id number] DOB: 06-Nov-1935 Today's Date: 10/01/2022   History of Present Illness 87 y.o. female with medical history significant of chronic leg wounds, lymphedema, HTN.  Presented with bilateral leg wounds. Dx of cellulitis.    PT Comments  Pt in good spirits and eager to participate.  Pt requiring increased time but with noted improvement in ability to bring legs over EOB.  However, pt continues to require extensive assist to bend at hips to bring trunk fwd and use of bed to lift up and stand into [place].  Pt up in Stedy standing and intermittently resting on seat x 8 min before transferring to recliner and positioned for comfort.      If plan is discharge home, recommend the following: Two people to help with bathing/dressing/bathroom;Two people to help with walking and/or transfers;Assistance with cooking/housework;Assist for transportation;Help with stairs or ramp for entrance   Can travel by private vehicle     No  Equipment Recommendations  Other (comment)    Recommendations for Other Services       Precautions / Restrictions Precautions Precautions: Fall Precaution Comments: also B LE wrapped for wound care adn swelling. 1 fall in past 6 months, unsure of pt's accuracy of history and PLOF Restrictions Weight Bearing Restrictions: No Other Position/Activity Restrictions: B leg wraps     Mobility  Bed Mobility Overal bed mobility: Needs Assistance Bed Mobility: Supine to Sit     Supine to sit: Mod assist, Max assist, +2 for physical assistance, +2 for safety/equipment, Used rails     General bed mobility comments: With increased time and use of bedrails, pt able to rotate to bring LEs over EOB but extensive assist to bring trunk to upright    Transfers Overall transfer level: Needs assistance Equipment used:  Antony Salmon) Transfers: Sit to/from Stand Sit to Stand: +2 physical assistance, Mod assist            General transfer comment: Most difficult part of the transfer was getting patient to sit more upright wiht flexion at her hips in order to lean forward and reach the bar of the stedy. Pt was very anxious and worried about everything ( lines, feet, falling, etc) however with instructiona nd directions for what you want her to do and what you are going to do each move fo the way helped to ease her anxiety about the transfer. Once standing up in stedy , she was supervision and also able to stand and hold her own weight without sitting on the stedy chair. But hip flexion to transition sit to stand and stand to sit is the most challenging. Pt transitioned to sit int eh recliner today for hte first time. Transfer via Lift Equipment: Stedy  Ambulation/Gait               General Gait Details: Stedy bed to chair only   Comptroller Bed    Modified Rankin (Stroke Patients Only)       Balance Overall balance assessment: History of Falls, Needs assistance Sitting-balance support: Feet supported, Bilateral upper extremity supported Sitting balance-Leahy Scale: Poor Sitting balance - Comments: pt with posterior lean requiring min to max assist to maintain neutral, due to fear of falling with forward flexion of hips. This was very challenging, but improved over time. Sat edge of bed working on this for over 8  minutes. Postural control: Posterior lean Standing balance support: During functional activity, Reliant on assistive device for balance Standing balance-Leahy Scale: Poor Standing balance comment: stand was in the stedy lift equipment                            Cognition Arousal: Alert Behavior During Therapy: Anxious Overall Cognitive Status: Within Functional Limits for tasks assessed                                 General Comments: Pt is hyperverbal and easily distracted and at times difficult to  redirect.        Exercises      General Comments        Pertinent Vitals/Pain Pain Assessment Pain Assessment: No/denies pain    Home Living                          Prior Function            PT Goals (current goals can now be found in the care plan section) Acute Rehab PT Goals Patient Stated Goal: knows she needs to go to rehab but ultimate goal is to return home . She wants to see her cat. PT Goal Formulation: With patient Time For Goal Achievement: 10/13/22 Potential to Achieve Goals: Good Progress towards PT goals: Progressing toward goals    Frequency    Min 1X/week      PT Plan      Co-evaluation              AM-PAC PT "6 Clicks" Mobility   Outcome Measure  Help needed turning from your back to your side while in a flat bed without using bedrails?: Total Help needed moving from lying on your back to sitting on the side of a flat bed without using bedrails?: Total Help needed moving to and from a bed to a chair (including a wheelchair)?: Total Help needed standing up from a chair using your arms (e.g., wheelchair or bedside chair)?: Total Help needed to walk in hospital room?: Total Help needed climbing 3-5 steps with a railing? : Total 6 Click Score: 6    End of Session Equipment Utilized During Treatment: Gait belt Activity Tolerance: Patient tolerated treatment well Patient left: with chair alarm set;in chair Nurse Communication: Mobility status;Need for lift equipment PT Visit Diagnosis: Muscle weakness (generalized) (M62.81);Other abnormalities of gait and mobility (R26.89);Difficulty in walking, not elsewhere classified (R26.2)     Time: 6213-0865 PT Time Calculation (min) (ACUTE ONLY): 27 min  Charges:    $Therapeutic Activity: 23-37 mins PT General Charges $$ ACUTE PT VISIT: 1 Visit                     Mauro Kaufmann PT Acute Rehabilitation Services Pager [phone] Office  [phone]    Natale Barba 10/01/2022, 9:55 AM

## 2022-10-01 NOTE — Progress Notes (Signed)
Physical Therapy Treatment Patient Details Name: Shelby Wiley MRN: [id number] DOB: 07-Oct-1935 Today's Date: 10/01/2022   History of Present Illness 87 y.o. female with medical history significant of chronic leg wounds, lymphedema, HTN.  Presented with bilateral leg wounds. Dx of cellulitis.    PT Comments  Pt continues very cooperative but with noted elevated anxiety level.  With increased time, pt assisted from recliner to standing in STEDY for transfer to [facility] followed by STEDY transfer to bedside sitting.  Improvement noted in pt ability to self assist into standing with STEDY.   If plan is discharge home, recommend the following: Two people to help with bathing/dressing/bathroom;Two people to help with walking and/or transfers;Assistance with cooking/housework;Assist for transportation;Help with stairs or ramp for entrance   Can travel by private vehicle     No  Equipment Recommendations  Other (comment)    Recommendations for Other Services       Precautions / Restrictions Precautions Precautions: Fall Precaution Comments: also B LE wrapped for wound care adn swelling. 1 fall in past 6 months, unsure of pt's accuracy of history and PLOF Restrictions Weight Bearing Restrictions: No Other Position/Activity Restrictions: B leg wraps     Mobility  Bed Mobility Overal bed mobility: Needs Assistance Bed Mobility: Sit to Supine       Sit to supine: Max assist, +2 for physical assistance, +2 for safety/equipment   General bed mobility comments: Assist to manage LEs onto bed and control trunk    Transfers Overall transfer level: Needs assistance Equipment used:  Antony Salmon) Transfers: Sit to/from Stand Sit to Stand: +2 physical assistance, Mod assist           General transfer comment: Most difficult part of the transfer was getting patient to sit more upright wiht flexion at her hips in order to lean forward and reach the bar of the stedy. Pt was very anxious and worried  about everything ( lines, feet, falling, etc) however with instructiona nd directions for what you want her to do and what you are going to do each move fo the way helped to ease her anxiety about the transfer. Once standing up in stedy , she was supervision and also able to stand and hold her own weight without sitting on the stedy chair. But hip flexion to transition sit to stand and stand to sit is the most challenging. Pt transitioned to sit int eh recliner today for hte first time. Transfer via Lift Equipment: Stedy  Ambulation/Gait               General Gait Details: Stedy recliner to [facility] to bed   Stairs             Wheelchair Mobility     Tilt Bed    Modified Rankin (Stroke Patients Only)       Balance Overall balance assessment: History of Falls, Needs assistance Sitting-balance support: Feet supported, Bilateral upper extremity supported Sitting balance-Leahy Scale: Poor Sitting balance - Comments: Posterior Lean requiring assist to prevent fall bkwd Postural control: Posterior lean Standing balance support: During functional activity, Reliant on assistive device for balance Standing balance-Leahy Scale: Poor Standing balance comment: stand was in the stedy lift equipment                            Cognition Arousal: Alert Behavior During Therapy: Anxious Overall Cognitive Status: Within Functional Limits for tasks assessed  General Comments: Pt is hyperverbal and easily distracted and at times difficult to redirect.        Exercises      General Comments        Pertinent Vitals/Pain Pain Assessment Pain Assessment: No/denies pain    Home Living                          Prior Function            PT Goals (current goals can now be found in the care plan section) Acute Rehab PT Goals Patient Stated Goal: knows she needs to go to rehab but ultimate goal is to return home .  She wants to see her cat. PT Goal Formulation: With patient Time For Goal Achievement: 10/13/22 Potential to Achieve Goals: Good Progress towards PT goals: Progressing toward goals    Frequency    Min 1X/week      PT Plan      Co-evaluation              AM-PAC PT "6 Clicks" Mobility   Outcome Measure  Help needed turning from your back to your side while in a flat bed without using bedrails?: Total Help needed moving from lying on your back to sitting on the side of a flat bed without using bedrails?: Total Help needed moving to and from a bed to a chair (including a wheelchair)?: Total Help needed standing up from a chair using your arms (e.g., wheelchair or bedside chair)?: Total Help needed to walk in hospital room?: Total Help needed climbing 3-5 steps with a railing? : Total 6 Click Score: 6    End of Session Equipment Utilized During Treatment: Gait belt Activity Tolerance: Patient tolerated treatment well Patient left: in bed;with call bell/phone within reach;with bed alarm set Nurse Communication: Mobility status;Need for lift equipment PT Visit Diagnosis: Muscle weakness (generalized) (M62.81);Other abnormalities of gait and mobility (R26.89);Difficulty in walking, not elsewhere classified (R26.2)     Time: 1191-4782 PT Time Calculation (min) (ACUTE ONLY): 33 min  Charges:    $Therapeutic Activity: 23-37 mins PT General Charges $$ ACUTE PT VISIT: 1 Visit                     Mauro Kaufmann PT Acute Rehabilitation Services Pager [phone] Office [phone]    Shelby Wiley 10/01/2022, 5:06 PM

## 2022-10-01 NOTE — Progress Notes (Signed)
PROGRESS NOTE    Shelby Wiley  VWU:[id number] DOB: 11/19/1935 DOA: 09/28/2022 PCP: Clayborn Heron, MD  Subjective: Pt seen and examined. Lives alone. Has been non-compliant with f/u for her unna boots.   Stable overnight. Stood up with PT yesterday. Agreeable to going to SNF for rehab.  I removed her wraps and re-examined her leg wounds. Took pictures again.   Hospital Course: HPI: Shelby Wiley is a 87 y.o. female with medical history significant of chronic leg wounds, lymphedema, HTN     Presented with bilateral leg wounds Has had Unna boots on for the past 2 months Lives at home alone  Evidence of severe wounds  In the past was seen by [facility] had been managing them but they switched suppliers so she stopped seeing him  The wound started to bleed thorough she went to PCP and was seen was sent to ER No fever no chills  Significant Events: Admitted 09/28/2022 for bilateral leg cellulitis, new afib   Significant Labs:   Significant Imaging Studies: Admission R/L tib/fib x-rays negative for osteomyelitis ABI shows normal ratios Echo 09-30-2022 shows LVEF 60-65%, moderate AS  Antibiotic Therapy: Anti-infectives (From admission, onward)    Start     Dose/Rate Route Frequency Ordered Stop   09/29/22 2359  vancomycin (VANCOREADY) IVPB 1250 mg/250 mL  Status:  Discontinued        1,250 mg 166.7 mL/hr over 90 Minutes Intravenous Every 24 hours 09/29/22 0029 09/30/22 1101   09/29/22 0900  ceFEPIme (MAXIPIME) 2 g in sodium chloride 0.9 % 100 mL IVPB  Status:  Discontinued        2 g 200 mL/hr over 30 Minutes Intravenous Every 8 hours 09/29/22 0042 09/30/22 1101   09/29/22 0115  vancomycin (VANCOREADY) IVPB 1500 mg/300 mL        1,500 mg 150 mL/hr over 120 Minutes Intravenous STAT 09/29/22 0026 09/29/22 0820   09/28/22 2315  ceFEPIme (MAXIPIME) 2 g in sodium chloride 0.9 % 100 mL IVPB        2 g 200 mL/hr over 30 Minutes Intravenous  Once 09/28/22  2313 09/29/22 0159   09/28/22 2200  vancomycin (VANCOCIN) IVPB 1000 mg/200 mL premix        1,000 mg 200 mL/hr over 60 Minutes Intravenous  Once 09/28/22 2148 09/29/22 0820       Procedures:   Consultants: Wound care    Assessment and Plan: * Bilateral cellulitis of lower leg Admitted for bilateral leg cellulitis. Appears that her unna boots have been left on both lower legs for a prolonged period of time.  On 09-29-2022 Wound care has removed most of the old wrappings. Some old wrapping adherent to lower leg skin. Have asked wound care to place saline soaked kerlex gauze around lower legs to soften the old wrappings so they are easier to remove on afternoon of 09-28-2025. 09-30-2022 Legs continue to look better after rest of old dressings were removed.  Procalcitonin is negative. Doubting she had cellulitis when she was admitted. Probably just rotting old dressing. Will stop IV ABX.   10-01-2022 legs continue to improve. Off abx. See pictures  Admission 09-28-2022 lower legs 09-29-2022 lower legs 09-30-2022 10-01-2022                   A-fib (HCC) New onset. Tsh is normal. Keep K >4.0 and Mg >2.0 CHAD-VASC is 6 Initially started on IV heparin on admission. Started Eliquis on 09-29-2022. Echo shows  normal LVEF 60-65%. Has moderate AS.  On coreg 3.125 mg bid.  Lymphedema Chronic. Pt will need SNF placement due to wounds on her legs.  Probably better to use ACE wraps rather than unna boots due to pt's non-compliance and lack of followup.  Hypokalemia Repleted with oral kcl resolved  Moderate aortic stenosis - mean gradient 22.5  mHg. peak gradient 37.3  mmHg 09-30-2022 echo shows The aortic valve is calcified. Aortic valve regurgitation is not visualized. Moderate aortic stenosis is present. Aortic valve mean gradient measures 22.5  mHg. Aortic valve peak gradient measures 37.3  mmHg. Aortic valve area, by VTI  measures 0.70 cm.   HYPERTENSION, BORDERLINE Stable. On coreg 3.125 mg  bid.  OBESITY, MORBID Chronic. BMI 44.6  DVT prophylaxis:  apixaban (ELIQUIS) tablet 5 mg     Code Status: Full Code Family Communication: no family at bedside Disposition Plan: SNF placement Reason for continuing need for hospitalization: medically stable for discharge  Objective: Vitals:   09/30/22 1316 09/30/22 2022 10/01/22 0454 10/01/22 0934  BP: 130/79 124/87 109/89 (!) 117/54  Pulse: 65 (!) 106 87 84  Resp: 20 18 18    Temp: 98.5 F (36.9 C) 99.1 F (37.3 C) 97.9 F (36.6 C)   TempSrc:  Oral Oral   SpO2: 98% 100% 98%   Weight:      Height:        Intake/Output Summary (Last 24 hours) at 10/01/2022 1112 Last data filed at 10/01/2022 0900 Gross per 24 hour  Intake 720 ml  Output 3302 ml  Net -2582 ml   Filed Weights   09/28/22 2345  Weight: 121.6 kg    Examination:  Physical Exam Vitals and nursing note reviewed.  Constitutional:      General: She is not in acute distress.    Appearance: She is obese. She is not toxic-appearing or diaphoretic.  HENT:     Head: Normocephalic and atraumatic.     Nose: Nose normal.  Eyes:     General: No scleral icterus. Cardiovascular:     Rate and Rhythm: Normal rate. Rhythm irregular.  Pulmonary:     Effort: Pulmonary effort is normal.     Breath sounds: Normal breath sounds.  Abdominal:     General: Bowel sounds are normal. There is no distension.     Tenderness: There is no abdominal tenderness.  Skin:    Capillary Refill: Capillary refill takes less than 2 seconds.     Comments: See pictures of lower legs  Neurological:     General: No focal deficit present.     Mental Status: She is alert and oriented to person, place, and time.          Data Reviewed: I have personally reviewed following labs and imaging studies  CBC: Recent Labs  Lab 09/28/22 1828 09/29/22 0532 10/01/22 0619  WBC 10.3 8.0 6.9  NEUTROABS 7.4  --  3.9  HGB 12.4 11.7* 11.3*  HCT 38.6 37.2 35.0*  MCV 97.5 101.6* 100.3*  PLT  220 178 208   Basic Metabolic Panel: Recent Labs  Lab 09/28/22 1828 09/28/22 2248 09/29/22 0532 10/01/22 0619  NA 139  --  137 135  K 3.4*  --  3.5 5.1  CL 101  --  105 104  CO2 29  --  26 26  GLUCOSE 109*  --  87 91  BUN 11  --  10 20  CREATININE 0.68  --  0.58 0.63  CALCIUM 8.6*  --  8.0* 8.1*  MG  --  2.2 2.1 2.2  PHOS  --  2.9 2.6  --    GFR: Estimated Creatinine Clearance: 68.4 mL/min (by C-G formula based on SCr of 0.63 mg/dL). Liver Function Tests: Recent Labs  Lab 09/28/22 1828 09/29/22 0532 10/01/22 0619  AST 11* 9* 10*  ALT 7 6 <5  ALKPHOS 83 68 58  BILITOT 1.0 0.7 0.8  PROT 6.9 5.4* 5.7*  ALBUMIN 3.2* 2.4* 2.4*   Coagulation Profile: Recent Labs  Lab 09/29/22 0532  INR 1.2   Cardiac Enzymes: Recent Labs  Lab 09/28/22 2248  CKTOTAL 34*   HbA1C: Recent Labs    09/28/22 2249  HGBA1C 5.2   Thyroid Function Tests: Recent Labs    09/30/22 0648  TSH 2.446   Sepsis Labs: Recent Labs  Lab 09/28/22 1834 09/28/22 2124 09/28/22 2248 10/01/22 0619  PROCALCITON  --   --  <0.10 <0.10  LATICACIDVEN 0.6 1.2  --   --     Recent Results (from the past 240 hour(s))  Aerobic Culture w Gram Stain (superficial specimen)     Status: None (Preliminary result)   Collection Time: 09/28/22 10:47 PM   Specimen: Wound  Result Value Ref Range Status   Specimen Description   Final    WOUND LEFT LOWER LEG Performed at [facility], [address], [place], [place] [address]    Special Requests   Final    NONE Performed at [facility], [address] [place], [place] [address]    Gram Stain   Final    FEW WBC PRESENT, PREDOMINANTLY MONONUCLEAR FEW GRAM POSITIVE COCCI IN PAIRS RARE GRAM NEGATIVE RODS    Culture   Final    CULTURE REINCUBATED FOR BETTER GROWTH Performed at [facility], [address], [place], [place] [address]    Report Status PENDING  Incomplete  MRSA Next Gen by PCR, Nasal      Status: None   Collection Time: 09/29/22  2:26 AM   Specimen: Nasal Mucosa; Nasal Swab  Result Value Ref Range Status   MRSA by PCR Next Gen NOT DETECTED NOT DETECTED Final    Comment: (NOTE) The GeneXpert MRSA Assay (FDA approved for NASAL specimens only), is one component of a comprehensive MRSA colonization surveillance program. It is not intended to diagnose MRSA infection nor to guide or monitor treatment for MRSA infections. Test performance is not FDA approved in patients less than 62 years old. Performed at [facility], [address], [place], [place] [address]      Radiology Studies: ECHOCARDIOGRAM COMPLETE  Result Date: 09/30/2022    ECHOCARDIOGRAM REPORT   Patient Name:   Shelby Wiley Date of Exam: 09/30/2022 Medical Rec #:  [id number]     Height:       65.0 in Accession #:    [id number]    Weight:       268.0 lb Date of Birth:  [birth year]    BSA:          2.240 m Patient Age:    86 years      BP:           103/59 mmHg Patient Gender: F             HR:           89 bpm. Exam Location:  Inpatient Procedure: 2D Echo, Cardiac Doppler and Color Doppler Indications:    Atrial Fibrillation  I48.91  History:        Patient has prior history of Echocardiogram examinations, most                 recent 02/12/2017. TIA and Stroke, Arrythmias:Atrial                 Fibrillation, Signs/Symptoms:Shortness of Breath; Risk                 Factors:Hypertension and Sleep Apnea.  Sonographer:    Lucendia Herrlich Referring Phys: [id number] IMPRESSIONS  1. Left ventricular ejection fraction, by estimation, is 60 to 65%. The left ventricle has normal function. The left ventricle has no regional wall motion abnormalities. Left ventricular diastolic parameters are indeterminate.  2. Right ventricular systolic function is normal. The right ventricular size is normal.  3. The mitral valve is grossly normal. No evidence of mitral valve regurgitation.  4. Tricuspid valve regurgitation is  moderate.  5. The aortic valve is calcified. Aortic valve regurgitation is not visualized. Moderate aortic valve stenosis.  6. The inferior vena cava is dilated in size with >50% respiratory variability, suggesting right atrial pressure of 8 mmHg. FINDINGS  Left Ventricle: Left ventricular ejection fraction, by estimation, is 60 to 65%. The left ventricle has normal function. The left ventricle has no regional wall motion abnormalities. The left ventricular internal cavity size was normal in size. There is  no left ventricular hypertrophy. Left ventricular diastolic parameters are indeterminate. Right Ventricle: The right ventricular size is normal. Right ventricular systolic function is normal. Left Atrium: Left atrial size was normal in size. Right Atrium: Right atrial size was normal in size. Pericardium: There is no evidence of pericardial effusion. Mitral Valve: The mitral valve is grossly normal. No evidence of mitral valve regurgitation. MV peak gradient, 9.6 mmHg. The mean mitral valve gradient is 3.0 mmHg. Tricuspid Valve: Tricuspid valve regurgitation is moderate. Aortic Valve: The aortic valve is calcified. Aortic valve regurgitation is not visualized. Moderate aortic stenosis is present. Aortic valve mean gradient measures 22.5 mmHg. Aortic valve peak gradient measures 37.3 mmHg. Aortic valve area, by VTI measures 0.70 cm. Pulmonic Valve: Pulmonic valve regurgitation is not visualized. Aorta: The aortic root and ascending aorta are structurally normal, with no evidence of dilitation. Venous: The inferior vena cava is dilated in size with greater than 50% respiratory variability, suggesting right atrial pressure of 8 mmHg. IAS/Shunts: No atrial level shunt detected by color flow Doppler.  LEFT VENTRICLE PLAX 2D LVIDd:         4.30 cm   Diastology LVIDs:         3.10 cm   LV e' medial:    9.03 cm/s LV PW:         0.90 cm   LV E/e' medial:  15.2 LV IVS:        0.90 cm   LV e' lateral:   8.05 cm/s LVOT  diam:     1.80 cm   LV E/e' lateral: 17.0 LV SV:         45 LV SV Index:   20 LVOT Area:     2.54 cm  RIGHT VENTRICLE             IVC RV S prime:     14.80 cm/s  IVC diam: 2.90 cm TAPSE (M-mode): 1.7 cm LEFT ATRIUM             Index        RIGHT ATRIUM  Index LA diam:        3.80 cm 1.70 cm/m   RA Area:     15.80 cm LA Vol (A2C):   61.0 ml 27.23 ml/m  RA Volume:   33.50 ml  14.95 ml/m LA Vol (A4C):   65.4 ml 29.19 ml/m LA Biplane Vol: 64.0 ml 28.57 ml/m  AORTIC VALVE AV Area (Vmax):    0.78 cm AV Area (Vmean):   0.68 cm AV Area (VTI):     0.70 cm AV Vmax:           305.50 cm/s AV Vmean:          223.000 cm/s AV VTI:            0.644 m AV Peak Grad:      37.3 mmHg AV Mean Grad:      22.5 mmHg LVOT Vmax:         93.80 cm/s LVOT Vmean:        59.700 cm/s LVOT VTI:          0.176 m LVOT/AV VTI ratio: 0.27  AORTA Ao Root diam: 3.40 cm Ao Asc diam:  3.40 cm MITRAL VALVE                TRICUSPID VALVE MV Area (PHT): 3.31 cm     TR Peak grad:   44.4 mmHg MV Area VTI:   1.48 cm     TR Vmax:        333.00 cm/s MV Peak grad:  9.6 mmHg MV Mean grad:  3.0 mmHg     SHUNTS MV Vmax:       1.55 m/s     Systemic VTI:  0.18 m MV Vmean:      76.4 cm/s    Systemic Diam: 1.80 cm MV Decel Time: 229 msec MR Peak grad: 36.0 mmHg MR Vmax:      300.00 cm/s MV E velocity: 137.00 cm/s MV A velocity: 104.00 cm/s MV E/A ratio:  1.32 Photographer signed by Carolan Clines Signature Date/Time: 09/30/2022/5:15:26 PM    Final     Scheduled Meds:  apixaban  5 mg Oral BID   ascorbic acid  500 mg Oral Daily   carvedilol  3.125 mg Oral BID WC   nutrition supplement (JUVEN)  1 packet Oral BID BM   zinc sulfate  220 mg Oral Daily   Continuous Infusions:   LOS: 3 days   Time spent: 35 minutes  Carollee Herter, DO  [facility]  10/01/2022, 11:12 AM

## 2022-10-01 NOTE — Plan of Care (Signed)
  Problem: Clinical Measurements: Goal: Ability to avoid or minimize complications of infection will improve Outcome: Progressing   Problem: Education: Goal: Knowledge of General Education information will improve Description: Including pain rating scale, medication(s)/side effects and non-pharmacologic comfort measures Outcome: Progressing   Problem: Clinical Measurements: Goal: Ability to maintain clinical measurements within normal limits will improve Outcome: Progressing Goal: Will remain free from infection Outcome: Progressing Goal: Diagnostic test results will improve Outcome: Progressing Goal: Respiratory complications will improve Outcome: Progressing Goal: Cardiovascular complication will be avoided Outcome: Progressing

## 2022-10-01 NOTE — Assessment & Plan Note (Signed)
09-30-2022 echo shows The aortic valve is calcified. Aortic valve regurgitation is not visualized. Moderate aortic stenosis is present. Aortic valve mean gradient measures 22.5  mHg. Aortic valve peak gradient measures 37.3  mmHg. Aortic valve area, by VTI  measures 0.70 cm.

## 2022-10-02 DIAGNOSIS — I890 Lymphedema, not elsewhere classified: Secondary | ICD-10-CM | POA: Diagnosis not present

## 2022-10-02 DIAGNOSIS — L03116 Cellulitis of left lower limb: Secondary | ICD-10-CM | POA: Diagnosis not present

## 2022-10-02 DIAGNOSIS — I350 Nonrheumatic aortic (valve) stenosis: Secondary | ICD-10-CM | POA: Diagnosis not present

## 2022-10-02 DIAGNOSIS — I480 Paroxysmal atrial fibrillation: Secondary | ICD-10-CM | POA: Diagnosis not present

## 2022-10-02 NOTE — Progress Notes (Signed)
Occupational Therapy Treatment Patient Details Name: Shelby Wiley MRN: [id number] DOB: 02/20/1935 Today's Date: 10/02/2022   History of present illness patient is a 87 y.o. female who presented on 9/26 with bilateral leg wounds. Dx of cellulitis, chronic leg wounds, lymphedema, HTN.   OT comments  Patient was noted to have limited trunk flexion and increased kyphotic posture impacting safety with movement. Patient is motivated to participate with education provided on gentle ROM and stretching to complete with neck and shoulders to maintain ROM and improve posture. Patient verbalized understanding but did have difficulty with processing how to complete tasks. Patient able to complete grooming tasks seated in recliner with set up. Patient will benefit from continued inpatient follow up therapy, <3 hours/day.  Patient's discharge plan remains appropriate at this time. OT will continue to follow acutely.        If plan is discharge home, recommend the following:  Two people to help with walking and/or transfers;Two people to help with bathing/dressing/bathroom;Assistance with cooking/housework;Assist for transportation;Help with stairs or ramp for entrance   Equipment Recommendations  None recommended by OT       Precautions / Restrictions Precautions Precautions: Fall Restrictions Weight Bearing Restrictions: No Other Position/Activity Restrictions: B leg wraps       Mobility Bed Mobility Overal bed mobility: Needs Assistance Bed Mobility: Sit to Supine     Supine to sit: Mod assist, Max assist, +2 for physical assistance, +2 for safety/equipment, Used rails     General bed mobility comments: Assist to manage LEs onto bed and control trunk             ADL either performed or assessed with clinical judgement   ADL Overall ADL's : Needs assistance/impaired     Grooming: Set up;Sitting;Oral care Grooming Details (indicate cue type and reason): in recliner                Lower Body Dressing Details (indicate cue type and reason): attempted to educated patient into figure four positioning of BLE at bed level to increase ability to engage in LB dressing/bathing tasks. patient Toilet Transfer: Total assistance;+2 for physical assistance Toilet Transfer Details (indicate cue type and reason): with STEDY to [facility] and then to recliner in room. Toileting- Architect and Hygiene: Sit to/from stand Toileting - Clothing Manipulation Details (indicate cue type and reason): in STEDY. patient was able to participate in hygiene with one UE support staning in STEDY.              Cognition Arousal: Alert Behavior During Therapy: Anxious Overall Cognitive Status: Within Functional Limits for tasks assessed       General Comments: Pt is hyperverbal and easily distracted and at times difficult to redirect.                   Pertinent Vitals/ Pain       Pain Assessment Pain Assessment: No/denies pain         Frequency  Min 1X/week        Progress Toward Goals  OT Goals(current goals can now be found in the care plan section)  Progress towards OT goals: Progressing toward goals      AM-PAC OT "6 Clicks" Daily Activity     Outcome Measure   Help from another person eating meals?: A Little Help from another person taking care of personal grooming?: A Little Help from another person toileting, which includes using toliet, bedpan, or urinal?: Total Help from another person bathing (  including washing, rinsing, drying)?: A Lot Help from another person to put on and taking off regular upper body clothing?: A Little Help from another person to put on and taking off regular lower body clothing?: A Lot 6 Click Score: 14    End of Session Equipment Utilized During Treatment: Other (comment);Gait belt (STEDY)  OT Visit Diagnosis: Muscle weakness (generalized) (M62.81);History of falling (Z91.81)   Activity Tolerance Patient tolerated  treatment well   Patient Left with call bell/phone within reach;in chair;with chair alarm set   Nurse Communication Mobility status        Time: 0272-5366 OT Time Calculation (min): 34 min  Charges: OT General Charges $OT Visit: 1 Visit OT Treatments $Self Care/Home Management : 23-37 mins  Rosalio Loud, MS Acute Rehabilitation Department Office# [phone]   Selinda Flavin 10/02/2022, 12:54 PM

## 2022-10-02 NOTE — TOC Progression Note (Signed)
Transition of Care [facility]) - Progression Note    Patient Details  Name: Shelby Wiley MRN: [id number] Date of Birth: [birth year]  Transition of [facility]) CM/SW Contact  Otelia Santee, LCSW Phone Number: 10/02/2022, 2:02 PM  Clinical Narrative:    Reviewed bed offers for SNF with pt. Pt has accepted bed offer for SNF at [facility] able to accept pt to their facility tomorrow 10/03/22.    Expected Discharge Plan: Skilled Nursing Facility Barriers to Discharge: Barriers Resolved  Expected Discharge Plan and Services In-house Referral: Clinical Social Work   Post Acute Care Choice: Skilled Nursing Facility Living arrangements for the past 2 months: Single Family Home                                       Social Determinants of Health (SDOH) Interventions SDOH Screenings   Food Insecurity: No Food Insecurity (09/29/2022)  Housing: Low Risk  (09/29/2022)  Transportation Needs: No Transportation Needs (09/29/2022)  Utilities: Not At Risk (09/29/2022)  Tobacco Use: Medium Risk (09/28/2022)    Readmission Risk Interventions    09/29/2022    4:02 PM  Readmission Risk Prevention Plan  Post Dischage Appt Complete  Medication Screening Complete  Transportation Screening Complete

## 2022-10-02 NOTE — Plan of Care (Signed)
  Problem: Clinical Measurements: Goal: Ability to avoid or minimize complications of infection will improve Outcome: Progressing   Problem: Skin Integrity: Goal: Skin integrity will improve Outcome: Progressing   Problem: Education: Goal: Knowledge of General Education information will improve Description: Including pain rating scale, medication(s)/side effects and non-pharmacologic comfort measures Outcome: Progressing   Problem: Health Behavior/Discharge Planning: Goal: Ability to manage health-related needs will improve Outcome: Progressing   

## 2022-10-02 NOTE — Progress Notes (Signed)
PROGRESS NOTE    Shelby Wiley  ZOX:[id number] DOB: 1935-10-17 DOA: 09/28/2022 PCP: Clayborn Heron, MD  Subjective: Pt seen and examined. Lives alone. Has been non-compliant with f/u for her unna boots.   Stable overnight. Apparently pt had argument with overnight nursing staff. Not documented.  Pt up in recliner eating lunch. I changed her dressings while she was sitting. I forgot to take pictures of her legs today.  Afebrile. CM says that pt can go to SNF tomorrow.   Hospital Course: HPI: Shelby Wiley is a 87 y.o. female with medical history significant of chronic leg wounds, lymphedema, HTN     Presented with bilateral leg wounds Has had Unna boots on for the past 2 months Lives at home alone  Evidence of severe wounds  In the past was seen by [facility] vein had been managing them but they switched suppliers so she stopped seeing him  The wound started to bleed thorough she went to PCP and was seen was sent to ER No fever no chills  Significant Events: Admitted 09/28/2022 for bilateral leg cellulitis, new afib   Significant Labs:   Significant Imaging Studies: Admission R/L tib/fib x-rays negative for osteomyelitis ABI shows normal ratios Echo 09-30-2022 shows LVEF 60-65%, moderate AS  Antibiotic Therapy: Anti-infectives (From admission, onward)    Start     Dose/Rate Route Frequency Ordered Stop   09/29/22 2359  vancomycin (VANCOREADY) IVPB 1250 mg/250 mL  Status:  Discontinued        1,250 mg 166.7 mL/hr over 90 Minutes Intravenous Every 24 hours 09/29/22 0029 09/30/22 1101   09/29/22 0900  ceFEPIme (MAXIPIME) 2 g in sodium chloride 0.9 % 100 mL IVPB  Status:  Discontinued        2 g 200 mL/hr over 30 Minutes Intravenous Every 8 hours 09/29/22 0042 09/30/22 1101   09/29/22 0115  vancomycin (VANCOREADY) IVPB 1500 mg/300 mL        1,500 mg 150 mL/hr over 120 Minutes Intravenous STAT 09/29/22 0026 09/29/22 0820   09/28/22 2315  ceFEPIme  (MAXIPIME) 2 g in sodium chloride 0.9 % 100 mL IVPB        2 g 200 mL/hr over 30 Minutes Intravenous  Once 09/28/22 2313 09/29/22 0159   09/28/22 2200  vancomycin (VANCOCIN) IVPB 1000 mg/200 mL premix        1,000 mg 200 mL/hr over 60 Minutes Intravenous  Once 09/28/22 2148 09/29/22 0820       Procedures:   Consultants: Wound care    Assessment and Plan: * Bilateral cellulitis of lower leg Admitted for bilateral leg cellulitis. Appears that her unna boots have been left on both lower legs for a prolonged period of time.  On 09-29-2022 Wound care has removed most of the old wrappings. Some old wrapping adherent to lower leg skin. Have asked wound care to place saline soaked kerlex gauze around lower legs to soften the old wrappings so they are easier to remove on afternoon of 09-28-2025. 09-30-2022 Legs continue to look better after rest of old dressings were removed.  Procalcitonin is negative. Doubting she had cellulitis when she was admitted. Probably just rotting old dressing. Will stop IV ABX.   10-01-2022 legs continue to improve. Off abx. See pictures  Admission 09-28-2022 lower legs 09-29-2022 lower legs 09-30-2022 10-01-2022                   A-fib (HCC) New onset. Tsh is normal. Keep K >  4.0 and Mg >2.0 CHAD-VASC is 6 Initially started on IV heparin on admission. Started Eliquis on 09-29-2022. Echo shows normal LVEF 60-65%. Has moderate AS.  On coreg 3.125 mg bid.  Lymphedema Chronic. Pt will need SNF placement due to wounds on her legs.  Probably better to use ACE wraps rather than unna boots due to pt's non-compliance and lack of followup.  Hypokalemia Repleted with oral kcl resolved  Moderate aortic stenosis - mean gradient 22.5  mHg. peak gradient 37.3  mmHg 09-30-2022 echo shows The aortic valve is calcified. Aortic valve regurgitation is not visualized. Moderate aortic stenosis is present. Aortic valve mean gradient measures 22.5  mHg. Aortic valve peak gradient  measures 37.3  mmHg. Aortic valve area, by VTI  measures 0.70 cm.   HYPERTENSION, BORDERLINE Stable. On coreg 3.125 mg bid.  OBESITY, MORBID Chronic. BMI 44.6   DVT prophylaxis:  apixaban (ELIQUIS) tablet 5 mg     Code Status: Full Code Family Communication: no family at bedside Disposition Plan: SNF Reason for continuing need for hospitalization: medically stable for DC today.  Objective: Vitals:   10/01/22 1244 10/01/22 1944 10/01/22 2210 10/02/22 0419  BP: 112/61 117/82 111/84 113/80  Pulse: 89 78 78 89  Resp: 18 17  18   Temp: 97.8 F (36.6 C) 98.4 F (36.9 C) 98.4 F (36.9 C) 97.9 F (36.6 C)  TempSrc: Oral     SpO2: 97% 97% 98% 99%  Weight:      Height:        Intake/Output Summary (Last 24 hours) at 10/02/2022 1330 Last data filed at 10/02/2022 1033 Gross per 24 hour  Intake 327 ml  Output 2350 ml  Net -2023 ml   Filed Weights   09/28/22 2345  Weight: 121.6 kg    Examination:  Physical Exam Vitals and nursing note reviewed.  Constitutional:      General: She is not in acute distress.    Appearance: She is obese. She is not toxic-appearing or diaphoretic.  HENT:     Head: Normocephalic and atraumatic.     Nose: Nose normal.  Eyes:     General: No scleral icterus. Cardiovascular:     Rate and Rhythm: Normal rate. Rhythm irregular.  Pulmonary:     Effort: Pulmonary effort is normal.     Breath sounds: Normal breath sounds.  Abdominal:     General: Bowel sounds are normal. There is no distension.     Tenderness: There is no abdominal tenderness.  Skin:    Capillary Refill: Capillary refill takes less than 2 seconds.  Neurological:     General: No focal deficit present.     Mental Status: She is alert and oriented to person, place, and time.     Data Reviewed: I have personally reviewed following labs and imaging studies  CBC: Recent Labs  Lab 09/28/22 1828 09/29/22 0532 10/01/22 0619  WBC 10.3 8.0 6.9  NEUTROABS 7.4  --  3.9  HGB  12.4 11.7* 11.3*  HCT 38.6 37.2 35.0*  MCV 97.5 101.6* 100.3*  PLT 220 178 208   Basic Metabolic Panel: Recent Labs  Lab 09/28/22 1828 09/28/22 2248 09/29/22 0532 10/01/22 0619  NA 139  --  137 135  K 3.4*  --  3.5 5.1  CL 101  --  105 104  CO2 29  --  26 26  GLUCOSE 109*  --  87 91  BUN 11  --  10 20  CREATININE 0.68  --  0.58  0.63  CALCIUM 8.6*  --  8.0* 8.1*  MG  --  2.2 2.1 2.2  PHOS  --  2.9 2.6  --    GFR: Estimated Creatinine Clearance: 68.4 mL/min (by C-G formula based on SCr of 0.63 mg/dL). Liver Function Tests: Recent Labs  Lab 09/28/22 1828 09/29/22 0532 10/01/22 0619  AST 11* 9* 10*  ALT 7 6 <5  ALKPHOS 83 68 58  BILITOT 1.0 0.7 0.8  PROT 6.9 5.4* 5.7*  ALBUMIN 3.2* 2.4* 2.4*   Coagulation Profile: Recent Labs  Lab 09/29/22 0532  INR 1.2   Cardiac Enzymes: Recent Labs  Lab 09/28/22 2248  CKTOTAL 34*    Thyroid Function Tests: Recent Labs    09/30/22 0648  TSH 2.446   Sepsis Labs: Recent Labs  Lab 09/28/22 1834 09/28/22 2124 09/28/22 2248 10/01/22 0619  PROCALCITON  --   --  <0.10 <0.10  LATICACIDVEN 0.6 1.2  --   --     Recent Results (from the past 240 hour(s))  Aerobic Culture w Gram Stain (superficial specimen)     Status: None (Preliminary result)   Collection Time: 09/28/22 10:47 PM   Specimen: Wound  Result Value Ref Range Status   Specimen Description   Final    WOUND LEFT LOWER LEG Performed at [facility], [address], [place], [place] [address]    Special Requests   Final    NONE Performed at [facility], [address], [place], [place] [address]    Gram Stain   Final    FEW WBC PRESENT, PREDOMINANTLY MONONUCLEAR FEW GRAM POSITIVE COCCI IN PAIRS RARE GRAM NEGATIVE RODS    Culture   Final    CULTURE REINCUBATED FOR BETTER GROWTH RARE PROTEUS MIRABILIS RARE KLEBSIELLA PNEUMONIAE RARE ESCHERICHIA COLI SUSCEPTIBILITIES TO FOLLOW Performed at [facility], [address], [place], [place] [address]    Report Status PENDING  Incomplete  MRSA Next Gen by PCR, Nasal     Status: None   Collection Time: 09/29/22  2:26 AM   Specimen: Nasal Mucosa; Nasal Swab  Result Value Ref Range Status   MRSA by PCR Next Gen NOT DETECTED NOT DETECTED Final    Comment: (NOTE) The GeneXpert MRSA Assay (FDA approved for NASAL specimens only), is one component of a comprehensive MRSA colonization surveillance program. It is not intended to diagnose MRSA infection nor to guide or monitor treatment for MRSA infections. Test performance is not FDA approved in patients less than 70 years old. Performed at [facility], [address], [place], [place] [address]      Radiology Studies: ECHOCARDIOGRAM COMPLETE  Result Date: 09/30/2022    ECHOCARDIOGRAM REPORT   Patient Name:   WINSLOW VERRILL Date of Exam: 09/30/2022 Medical Rec #:  [id number]     Height:       65.0 in Accession #:    [id number]    Weight:       268.0 lb Date of Birth:  1935-03-25    BSA:          2.240 m Patient Age:    86 years      BP:           103/59 mmHg Patient Gender: F             HR:           89 bpm. Exam Location:  Inpatient Procedure: 2D Echo, Cardiac Doppler and Color  Doppler Indications:    Atrial Fibrillation I48.91  History:        Patient has prior history of Echocardiogram examinations, most                 recent 02/12/2017. TIA and Stroke, Arrythmias:Atrial                 Fibrillation, Signs/Symptoms:Shortness of Breath; Risk                 Factors:Hypertension and Sleep Apnea.  Sonographer:    Lucendia Herrlich Referring Phys: [id number] IMPRESSIONS  1. Left ventricular ejection fraction, by estimation, is 60 to 65%. The left ventricle has normal function. The left ventricle has no regional wall motion abnormalities. Left ventricular diastolic parameters are indeterminate.  2. Right ventricular systolic function is normal. The right ventricular size is normal.  3. The  mitral valve is grossly normal. No evidence of mitral valve regurgitation.  4. Tricuspid valve regurgitation is moderate.  5. The aortic valve is calcified. Aortic valve regurgitation is not visualized. Moderate aortic valve stenosis.  6. The inferior vena cava is dilated in size with >50% respiratory variability, suggesting right atrial pressure of 8 mmHg. FINDINGS  Left Ventricle: Left ventricular ejection fraction, by estimation, is 60 to 65%. The left ventricle has normal function. The left ventricle has no regional wall motion abnormalities. The left ventricular internal cavity size was normal in size. There is  no left ventricular hypertrophy. Left ventricular diastolic parameters are indeterminate. Right Ventricle: The right ventricular size is normal. Right ventricular systolic function is normal. Left Atrium: Left atrial size was normal in size. Right Atrium: Right atrial size was normal in size. Pericardium: There is no evidence of pericardial effusion. Mitral Valve: The mitral valve is grossly normal. No evidence of mitral valve regurgitation. MV peak gradient, 9.6 mmHg. The mean mitral valve gradient is 3.0 mmHg. Tricuspid Valve: Tricuspid valve regurgitation is moderate. Aortic Valve: The aortic valve is calcified. Aortic valve regurgitation is not visualized. Moderate aortic stenosis is present. Aortic valve mean gradient measures 22.5 mmHg. Aortic valve peak gradient measures 37.3 mmHg. Aortic valve area, by VTI measures 0.70 cm. Pulmonic Valve: Pulmonic valve regurgitation is not visualized. Aorta: The aortic root and ascending aorta are structurally normal, with no evidence of dilitation. Venous: The inferior vena cava is dilated in size with greater than 50% respiratory variability, suggesting right atrial pressure of 8 mmHg. IAS/Shunts: No atrial level shunt detected by color flow Doppler.  LEFT VENTRICLE PLAX 2D LVIDd:         4.30 cm   Diastology LVIDs:         3.10 cm   LV e' medial:    9.03  cm/s LV PW:         0.90 cm   LV E/e' medial:  15.2 LV IVS:        0.90 cm   LV e' lateral:   8.05 cm/s LVOT diam:     1.80 cm   LV E/e' lateral: 17.0 LV SV:         45 LV SV Index:   20 LVOT Area:     2.54 cm  RIGHT VENTRICLE             IVC RV S prime:     14.80 cm/s  IVC diam: 2.90 cm TAPSE (M-mode): 1.7 cm LEFT ATRIUM             Index  RIGHT ATRIUM           Index LA diam:        3.80 cm 1.70 cm/m   RA Area:     15.80 cm LA Vol (A2C):   61.0 ml 27.23 ml/m  RA Volume:   33.50 ml  14.95 ml/m LA Vol (A4C):   65.4 ml 29.19 ml/m LA Biplane Vol: 64.0 ml 28.57 ml/m  AORTIC VALVE AV Area (Vmax):    0.78 cm AV Area (Vmean):   0.68 cm AV Area (VTI):     0.70 cm AV Vmax:           305.50 cm/s AV Vmean:          223.000 cm/s AV VTI:            0.644 m AV Peak Grad:      37.3 mmHg AV Mean Grad:      22.5 mmHg LVOT Vmax:         93.80 cm/s LVOT Vmean:        59.700 cm/s LVOT VTI:          0.176 m LVOT/AV VTI ratio: 0.27  AORTA Ao Root diam: 3.40 cm Ao Asc diam:  3.40 cm MITRAL VALVE                TRICUSPID VALVE MV Area (PHT): 3.31 cm     TR Peak grad:   44.4 mmHg MV Area VTI:   1.48 cm     TR Vmax:        333.00 cm/s MV Peak grad:  9.6 mmHg MV Mean grad:  3.0 mmHg     SHUNTS MV Vmax:       1.55 m/s     Systemic VTI:  0.18 m MV Vmean:      76.4 cm/s    Systemic Diam: 1.80 cm MV Decel Time: 229 msec MR Peak grad: 36.0 mmHg MR Vmax:      300.00 cm/s MV E velocity: 137.00 cm/s MV A velocity: 104.00 cm/s MV E/A ratio:  1.32 Photographer signed by Carolan Clines Signature Date/Time: 09/30/2022/5:15:26 PM    Final     Scheduled Meds:  apixaban  5 mg Oral BID   ascorbic acid  500 mg Oral Daily   carvedilol  3.125 mg Oral BID WC   nutrition supplement (JUVEN)  1 packet Oral BID BM   zinc sulfate  220 mg Oral Daily   Continuous Infusions:   LOS: 4 days   Time spent: 35 minutes  Carollee Herter, DO  [facility]  10/02/2022, 1:30 PM

## 2022-10-02 NOTE — Plan of Care (Signed)
Pt is progressing 

## 2022-10-02 NOTE — Progress Notes (Signed)
Mobility Specialist - Progress Note   10/02/22 1018  Mobility  Activity Transferred from bed to chair  Level of Assistance +2 (takes two people)  Assistive Device [employer] Ambulated (ft) 0 ft  Range of Motion/Exercises Active  Activity Response Tolerated well  Mobility Referral Yes  $Mobility charge 1 Mobility  Mobility Specialist Start Time (ACUTE ONLY) 0945  Mobility Specialist Stop Time (ACUTE ONLY) 1005  Mobility Specialist Time Calculation (min) (ACUTE ONLY) 20 min   Pt received in bed and agreed to mobility. Had no issues throughout session, still difficulty leaning forward but progressing somewhat.  Using steady for standing has gotten easier requiring less assistance but still requires two persons.  Returned to chair with all needs met, OT in room.  Marilynne Halsted Mobility Specialist

## 2022-10-02 NOTE — Progress Notes (Signed)
Mobility Specialist - Progress Note   10/02/22 1411  Mobility  Activity Transferred from chair to bed  Level of Assistance +2 (takes two people)  Assistive Device [employer] of Motion/Exercises Active  Activity Response Tolerated fair  Mobility Referral Yes  $Mobility charge 1 Mobility  Mobility Specialist Start Time (ACUTE ONLY) 1400  Mobility Specialist Stop Time (ACUTE ONLY) 1411  Mobility Specialist Time Calculation (min) (ACUTE ONLY) 11 min   Received in chair and agreed to transfer back to bed, using steady with NT assistance pt was able to stand and returned to bed where pt was left with NT in room.  Marilynne Halsted Mobility Specialist

## 2022-10-03 DIAGNOSIS — E876 Hypokalemia: Secondary | ICD-10-CM | POA: Diagnosis not present

## 2022-10-03 DIAGNOSIS — I890 Lymphedema, not elsewhere classified: Secondary | ICD-10-CM | POA: Diagnosis not present

## 2022-10-03 DIAGNOSIS — I480 Paroxysmal atrial fibrillation: Secondary | ICD-10-CM | POA: Diagnosis not present

## 2022-10-03 DIAGNOSIS — L03116 Cellulitis of left lower limb: Secondary | ICD-10-CM | POA: Diagnosis not present

## 2022-10-03 LAB — COMPREHENSIVE METABOLIC PANEL
ALT: 9 U/L (ref 0–44)
AST: 12 U/L — ABNORMAL LOW (ref 15–41)
Albumin: 2.6 g/dL — ABNORMAL LOW (ref 3.5–5.0)
Alkaline Phosphatase: 56 U/L (ref 38–126)
Anion gap: 6 (ref 5–15)
BUN: 32 mg/dL — ABNORMAL HIGH (ref 8–23)
CO2: 29 mmol/L (ref 22–32)
Calcium: 8.5 mg/dL — ABNORMAL LOW (ref 8.9–10.3)
Chloride: 99 mmol/L (ref 98–111)
Creatinine, Ser: 0.68 mg/dL (ref 0.44–1.00)
GFR, Estimated: >60 mL/min (ref >60)
Glucose, Bld: 120 mg/dL — ABNORMAL HIGH (ref 70–99)
Potassium: 4.3 mmol/L (ref 3.5–5.1)
Sodium: 134 mmol/L — ABNORMAL LOW (ref 135–145)
Total Bilirubin: 0.6 mg/dL (ref 0.3–1.2)
Total Protein: 6 g/dL — ABNORMAL LOW (ref 6.5–8.1)

## 2022-10-03 LAB — CBC WITH DIFFERENTIAL/PLATELET
Abs Immature Granulocytes: 0.03 10*3/uL (ref 0.00–0.07)
Basophils Absolute: 0.1 10*3/uL (ref 0.0–0.1)
Basophils Relative: 1 %
Eosinophils Absolute: 0.4 10*3/uL (ref 0.0–0.5)
Eosinophils Relative: 5 %
HCT: 37.5 % (ref 36.0–46.0)
Hemoglobin: 11.7 g/dL — ABNORMAL LOW (ref 12.0–15.0)
Immature Granulocytes: 0 %
Lymphocytes Relative: 26 %
Lymphs Abs: 1.8 10*3/uL (ref 0.7–4.0)
MCH: 31.2 pg (ref 26.0–34.0)
MCHC: 31.2 g/dL (ref 30.0–36.0)
MCV: 100 fL (ref 80.0–100.0)
Monocytes Absolute: 0.8 10*3/uL (ref 0.1–1.0)
Monocytes Relative: 11 %
Neutro Abs: 4 10*3/uL (ref 1.7–7.7)
Neutrophils Relative %: 57 %
Platelets: 220 10*3/uL (ref 150–400)
RBC: 3.75 MIL/uL — ABNORMAL LOW (ref 3.87–5.11)
RDW: 12.7 % (ref 11.5–15.5)
WBC: 7 10*3/uL (ref 4.0–10.5)
nRBC: 0 % (ref 0.0–0.2)

## 2022-10-03 LAB — AEROBIC CULTURE W GRAM STAIN (SUPERFICIAL SPECIMEN)

## 2022-10-03 LAB — MAGNESIUM: Magnesium: 2.3 mg/dL (ref 1.7–2.4)

## 2022-10-03 NOTE — Plan of Care (Signed)
  Problem: Skin Integrity: Goal: Skin integrity will improve Outcome: Progressing   Problem: Education: Goal: Knowledge of General Education information will improve Description: Including pain rating scale, medication(s)/side effects and non-pharmacologic comfort measures Outcome: Progressing   Problem: Health Behavior/Discharge Planning: Goal: Ability to manage health-related needs will improve Outcome: Progressing

## 2022-10-03 NOTE — TOC Transition Note (Signed)
Transition of Care [facility]) - CM/SW Discharge Note   Patient Details  Name: Shelby Wiley MRN: [id number] Date of Birth: Oct 19, 1935  [facility] CM/SW Contact:  Otelia Santee, LCSW Phone Number: 10/03/2022, 11:46 AM   Clinical Narrative:    Pt will be transferring to [facility] for ST SNF. Pt will be going to room 1201p. RN to call report to [phone]. DC packet placed at RN station. [facility] called at 11:41am for transportation.    Final next level of care: Skilled Nursing Facility Barriers to Discharge: Barriers Resolved   Patient Goals and CMS Choice CMS Medicare.gov Compare Post Acute Care list provided to:: Patient Choice offered to / list presented to : Patient  Discharge Placement     Existing PASRR number confirmed : 09/30/22          Patient chooses bed at: [facility] Patient to be transferred to facility by: [facility] Name of family member notified: Pt Patient and family notified of of transfer: 10/03/22  Discharge Plan and Services Additional resources added to the After Visit Summary for   In-house Referral: Clinical Social Work   Post Acute Care Choice: Skilled Nursing Facility          DME Arranged: N/A DME Agency: NA                  Social Determinants of Health (SDOH) Interventions SDOH Screenings   Food Insecurity: No Food Insecurity (09/29/2022)  Housing: Low Risk  (09/29/2022)  Transportation Needs: No Transportation Needs (09/29/2022)  Utilities: Not At Risk (09/29/2022)  Tobacco Use: Medium Risk (09/28/2022)     Readmission Risk Interventions    09/29/2022    4:02 PM  Readmission Risk Prevention Plan  Post Dischage Appt Complete  Medication Screening Complete  Transportation Screening Complete

## 2022-10-03 NOTE — Discharge Summary (Signed)
[facility] Physician Discharge Summary   Patient name: Shelby Wiley  Admit date:     09/28/2022  Discharge date: 10/03/2022  Attending Physician: Therisa Doyne [[id number]]  Discharge Physician: Carollee Herter   PCP: Clayborn Heron, MD  Admitted From: Home  Disposition:   [facility]  SNF  Recommendations for Outpatient Follow-up:  Follow up with PCP in 1-2 weeks Continue with Daily wound care. See orders  Home Health:No Equipment/Devices: None    Discharge Condition:Stable CODE STATUS:FULL Diet recommendation: Heart Healthy, high protein diet Fluid Restriction: None  Hospital Summary: HPI: Shelby Wiley is a 87 y.o. female with medical history significant of chronic leg wounds, lymphedema, HTN     Presented with bilateral leg wounds Has had Unna boots on for the past 2 months Lives at home alone  Evidence of severe wounds  In the past was seen by [facility] and vein had been managing them but they switched suppliers so she stopped seeing him  The wound started to bleed thorough she went to PCP and was seen was sent to ER No fever no chills  Significant Events: Admitted 09/28/2022 for bilateral leg cellulitis, new afib   Significant Labs: Discharge BUN 32, Scr 0.68, HgB 11.7, WBC 7.0  Significant Imaging Studies: Admission R/L tib/fib x-rays negative for osteomyelitis ABI shows normal ratios Echo 09-30-2022 shows LVEF 60-65%, moderate AS  Antibiotic Therapy: Anti-infectives (From admission, onward)    Start     Dose/Rate Route Frequency Ordered Stop   09/29/22 2359  vancomycin (VANCOREADY) IVPB 1250 mg/250 mL  Status:  Discontinued        1,250 mg 166.7 mL/hr over 90 Minutes Intravenous Every 24 hours 09/29/22 0029 09/30/22 1101   09/29/22 0900  ceFEPIme (MAXIPIME) 2 g in sodium chloride 0.9 % 100 mL IVPB  Status:  Discontinued        2 g 200 mL/hr over 30 Minutes Intravenous Every 8 hours 09/29/22 0042 09/30/22 1101   09/29/22 0115   vancomycin (VANCOREADY) IVPB 1500 mg/300 mL        1,500 mg 150 mL/hr over 120 Minutes Intravenous STAT 09/29/22 0026 09/29/22 0820   09/28/22 2315  ceFEPIme (MAXIPIME) 2 g in sodium chloride 0.9 % 100 mL IVPB        2 g 200 mL/hr over 30 Minutes Intravenous  Once 09/28/22 2313 09/29/22 0159   09/28/22 2200  vancomycin (VANCOCIN) IVPB 1000 mg/200 mL premix        1,000 mg 200 mL/hr over 60 Minutes Intravenous  Once 09/28/22 2148 09/29/22 0820       Procedures:   Consultants: Wound care   Hospital Course by Problem: * Bilateral cellulitis of lower leg Admitted for bilateral leg cellulitis. Appears that her unna boots have been left on both lower legs for a prolonged period of time.  On 09-29-2022 Wound care has removed most of the old wrappings. Some old wrapping adherent to lower leg skin. Have asked wound care to place saline soaked kerlex gauze around lower legs to soften the old wrappings so they are easier to remove on afternoon of 09-28-2025. 09-30-2022 Legs continue to look better after rest of old dressings were removed.  Procalcitonin is negative. Doubting she had cellulitis when she was admitted. Probably just rotting old dressing. Will stop IV ABX.   10-01-2022 legs continue to improve. Off abx. 10-03-2022 stable for discharge. Off abx. Continues to need daily wound care.  Wound cx grew multiple species including MSSA staph,  e. Coli, proteus, klebsiella. I think this is all just colonization from her rotten unna wounds. Pt's wounds are healing nicely without any abx therapy.  See needs to eat a high protein diet(75-100 grams per day) in order to heal her wounds. See pictures  Admission 09-28-2022 lower legs 09-29-2022 lower legs 09-30-2022 10-01-2022                  10-03-2022 - Date of Discharge          A-fib [facility]) New onset. Tsh is normal. Keep K >4.0 and Mg >2.0 CHAD-VASC is 6 Initially started on IV heparin on admission. Started Eliquis on 09-29-2022. Echo shows  normal LVEF 60-65%. Has moderate AS.  On coreg 3.125 mg bid.  Lymphedema Chronic. Pt will need SNF placement due to wounds on her legs.  Probably better to use ACE wraps rather than unna boots due to pt's non-compliance and lack of followup.  Hypokalemia Repleted with oral kcl resolved  Moderate aortic stenosis - mean gradient 22.5  mHg. peak gradient 37.3  mmHg 09-30-2022 echo shows The aortic valve is calcified. Aortic valve regurgitation is not visualized. Moderate aortic stenosis is present. Aortic valve mean gradient measures 22.5  mHg. Aortic valve peak gradient measures 37.3  mmHg. Aortic valve area, by VTI  measures 0.70 cm.   HYPERTENSION, BORDERLINE Stable. On coreg 3.125 mg bid.  OBESITY, MORBID Chronic. BMI 44.6    Discharge Diagnoses:  Principal Problem:   Bilateral cellulitis of lower leg Active Problems:   Hypokalemia   Lymphedema   A-fib (HCC)   OBESITY, MORBID   HYPERTENSION, BORDERLINE   Moderate aortic stenosis - mean gradient 22.5  mHg. peak gradient 37.3  mmHg   Discharge Instructions  Discharge Instructions     Call MD for:  difficulty breathing, headache or visual disturbances   Complete by: As directed    Call MD for:  extreme fatigue   Complete by: As directed    Call MD for:  persistant dizziness or light-headedness   Complete by: As directed    Call MD for:  persistant nausea and vomiting   Complete by: As directed    Call MD for:  redness, tenderness, or signs of infection (pain, swelling, redness, odor or green/yellow discharge around incision site)   Complete by: As directed    Call MD for:  severe uncontrolled pain   Complete by: As directed    Call MD for:  temperature >100.4   Complete by: As directed    Diet - low sodium heart healthy   Complete by: As directed    Discharge instructions   Complete by: As directed    1. Follow up with primary care provider within 1-2 weeks following hospital discharge.   Discharge wound care:    Complete by: As directed    Clean bilateral anterior and posterior legs with Vashe wound cleanser, apply Vashe moistened gauze [place] [phone]) to legs daily, cover with dry gauze and ABD pads.  Wrap legs with Kerlix roll gauze beginning just above toes and ending right below knees. Secure dressing with Ace bandages for light compression   Increase activity slowly   Complete by: As directed       Allergies as of 10/03/2022       Reactions   Codeine Nausea And Vomiting, Other (See Comments)   Caused GI upset        Medication List     STOP taking these medications    ketoconazole  2 % cream Commonly known as: NIZORAL   ondansetron 4 MG disintegrating tablet Commonly known as: Zofran ODT       TAKE these medications    amoxicillin 500 MG capsule Commonly known as: AMOXIL Take 2,000 mg by mouth See admin instructions. Take 2,000 mg by mouth one hour prior to dental appointments.   apixaban 5 MG Tabs tablet Commonly known as: ELIQUIS Take 1 tablet (5 mg total) by mouth 2 (two) times daily.   ascorbic acid 500 MG tablet Commonly known as: VITAMIN C Take 1 tablet (500 mg total) by mouth daily.   carvedilol 3.125 MG tablet Commonly known as: COREG Take 1 tablet (3.125 mg total) by mouth 2 (two) times daily with a meal.   multivitamin tablet Take 1 tablet by mouth daily with breakfast.   nutrition supplement (JUVEN) Pack Take 1 packet by mouth 2 (two) times daily between meals.   zinc sulfate 220 (50 Zn) MG capsule Take 1 capsule (220 mg total) by mouth daily.               Discharge Care Instructions  (From admission, onward)           Start     Ordered   10/01/22 0000  Discharge wound care:       Comments: Clean bilateral anterior and posterior legs with Vashe wound cleanser, apply Vashe moistened gauze [place] [phone]) to legs daily, cover with dry gauze and ABD pads.  Wrap legs with Kerlix roll gauze beginning just above toes and ending right below  knees. Secure dressing with Ace bandages for light compression   10/01/22 1331            Contact information for after-discharge care     Destination     [facility] Preferred SNF .   Service: Skilled Nursing Contact information: [address] [place] [address] [phone]                    Allergies  Allergen Reactions   Codeine Nausea And Vomiting and Other (See Comments)    Caused GI upset    Discharge Exam: Vitals:   10/02/22 1939 10/03/22 0536  BP: (!) 105/46 123/61  Pulse: 93 95  Resp: 20 19  Temp: 98.6 F (37 C) 98.3 F (36.8 C)  SpO2: 98% 98%    Physical Exam Vitals and nursing note reviewed.  Constitutional:      General: She is not in acute distress.    Appearance: She is obese. She is not toxic-appearing or diaphoretic.  HENT:     Head: Normocephalic and atraumatic.     Nose: Nose normal.  Eyes:     General: No scleral icterus. Cardiovascular:     Rate and Rhythm: Normal rate. Rhythm irregular.  Pulmonary:     Effort: Pulmonary effort is normal.     Breath sounds: Normal breath sounds.  Abdominal:     General: Bowel sounds are normal. There is no distension.     Tenderness: There is no abdominal tenderness.  Skin:    Capillary Refill: Capillary refill takes less than 2 seconds.     Comments: See pictures of legs at discharge  Neurological:     General: No focal deficit present.     Mental Status: She is alert and oriented to person, place, and time.          The results of significant diagnostics from this hospitalization (including imaging, microbiology,  ancillary and laboratory) are listed below for reference.    Microbiology: Recent Results (from the past 240 hour(s))  Aerobic Culture w Gram Stain (superficial specimen)     Status: None   Collection Time: 09/28/22 10:47 PM   Specimen: Wound  Result Value Ref Range Status   Specimen Description   Final    WOUND LEFT  LOWER LEG Performed at [facility], [address] [place], [place] [address]    Special Requests   Final    NONE Performed at [facility], [address] [place], [place] [address]    Gram Stain   Final    FEW WBC PRESENT, PREDOMINANTLY MONONUCLEAR FEW GRAM POSITIVE COCCI IN PAIRS RARE GRAM NEGATIVE RODS Performed at [facility], [address] [place], [place] [address]    Culture   Final    ABUNDANT STAPHYLOCOCCUS AUREUS RARE PROTEUS MIRABILIS RARE KLEBSIELLA PNEUMONIAE RARE ESCHERICHIA COLI    Report Status 10/03/2022 FINAL  Final   Organism ID, Bacteria PROTEUS MIRABILIS  Final   Organism ID, Bacteria KLEBSIELLA PNEUMONIAE  Final   Organism ID, Bacteria ESCHERICHIA COLI  Final   Organism ID, Bacteria STAPHYLOCOCCUS AUREUS  Final      Susceptibility   Escherichia coli - MIC*    AMPICILLIN >=32 RESISTANT Resistant     CEFEPIME <=0.12 SENSITIVE Sensitive     CEFTAZIDIME <=1 SENSITIVE Sensitive     CEFTRIAXONE <=0.25 SENSITIVE Sensitive     CIPROFLOXACIN <=0.25 SENSITIVE Sensitive     GENTAMICIN <=1 SENSITIVE Sensitive     IMIPENEM <=0.25 SENSITIVE Sensitive     TRIMETH/SULFA <=20 SENSITIVE Sensitive     AMPICILLIN/SULBACTAM 16 INTERMEDIATE Intermediate     PIP/TAZO <=4 SENSITIVE Sensitive     * RARE ESCHERICHIA COLI   Klebsiella pneumoniae - MIC*    AMPICILLIN RESISTANT Resistant     CEFEPIME <=0.12 SENSITIVE Sensitive     CEFTAZIDIME <=1 SENSITIVE Sensitive     CEFTRIAXONE <=0.25 SENSITIVE Sensitive     CIPROFLOXACIN <=0.25 SENSITIVE Sensitive     GENTAMICIN <=1 SENSITIVE Sensitive     IMIPENEM <=0.25 SENSITIVE Sensitive     TRIMETH/SULFA <=20 SENSITIVE Sensitive     AMPICILLIN/SULBACTAM 4 SENSITIVE Sensitive     PIP/TAZO <=4 SENSITIVE Sensitive     * RARE KLEBSIELLA PNEUMONIAE   Proteus mirabilis - MIC*    AMPICILLIN <=2 SENSITIVE Sensitive     CEFEPIME <=0.12 SENSITIVE Sensitive     CEFTAZIDIME <=1 SENSITIVE  Sensitive     CEFTRIAXONE <=0.25 SENSITIVE Sensitive     CIPROFLOXACIN <=0.25 SENSITIVE Sensitive     GENTAMICIN <=1 SENSITIVE Sensitive     IMIPENEM 2 SENSITIVE Sensitive     TRIMETH/SULFA <=20 SENSITIVE Sensitive     AMPICILLIN/SULBACTAM <=2 SENSITIVE Sensitive     PIP/TAZO <=4 SENSITIVE Sensitive     * RARE PROTEUS MIRABILIS   Staphylococcus aureus - MIC*    CIPROFLOXACIN <=0.5 SENSITIVE Sensitive     ERYTHROMYCIN <=0.25 SENSITIVE Sensitive     GENTAMICIN <=0.5 SENSITIVE Sensitive     OXACILLIN <=0.25 SENSITIVE Sensitive     TETRACYCLINE <=1 SENSITIVE Sensitive     VANCOMYCIN <=0.5 SENSITIVE Sensitive     TRIMETH/SULFA <=10 SENSITIVE Sensitive     CLINDAMYCIN <=0.25 SENSITIVE Sensitive     RIFAMPIN <=0.5 SENSITIVE Sensitive     Inducible Clindamycin NEGATIVE Sensitive     LINEZOLID 1 SENSITIVE Sensitive     * ABUNDANT STAPHYLOCOCCUS AUREUS  MRSA Next Gen by  PCR, Nasal     Status: None   Collection Time: 09/29/22  2:26 AM   Specimen: Nasal Mucosa; Nasal Swab  Result Value Ref Range Status   MRSA by PCR Next Gen NOT DETECTED NOT DETECTED Final    Comment: (NOTE) The GeneXpert MRSA Assay (FDA approved for NASAL specimens only), is one component of a comprehensive MRSA colonization surveillance program. It is not intended to diagnose MRSA infection nor to guide or monitor treatment for MRSA infections. Test performance is not FDA approved in patients less than 61 years old. Performed at [facility], [address], [place], [place] [address]      Labs:  Basic Metabolic Panel: Recent Labs  Lab 09/28/22 1828 09/28/22 2248 09/29/22 0532 10/01/22 0619 10/03/22 0548  NA 139  --  137 135 134*  K 3.4*  --  3.5 5.1 4.3  CL 101  --  105 104 99  CO2 29  --  26 26 29   GLUCOSE 109*  --  87 91 120*  BUN 11  --  10 20 32*  CREATININE 0.68  --  0.58 0.63 0.68  CALCIUM 8.6*  --  8.0* 8.1* 8.5*  MG  --  2.2 2.1 2.2 2.3  PHOS  --  2.9 2.6  --   --    Liver  Function Tests: Recent Labs  Lab 09/28/22 1828 09/29/22 0532 10/01/22 0619 10/03/22 0548  AST 11* 9* 10* 12*  ALT 7 6 <5 9  ALKPHOS 83 68 58 56  BILITOT 1.0 0.7 0.8 0.6  PROT 6.9 5.4* 5.7* 6.0*  ALBUMIN 3.2* 2.4* 2.4* 2.6*    CBC: Recent Labs  Lab 09/28/22 1828 09/29/22 0532 10/01/22 0619 10/03/22 0548  WBC 10.3 8.0 6.9 7.0  NEUTROABS 7.4  --  3.9 4.0  HGB 12.4 11.7* 11.3* 11.7*  HCT 38.6 37.2 35.0* 37.5  MCV 97.5 101.6* 100.3* 100.0  PLT 220 178 208 220   Cardiac Enzymes: Recent Labs  Lab 09/28/22 2248  CKTOTAL 34*   Sepsis Labs Recent Labs  Lab 09/28/22 1828 09/29/22 0532 10/01/22 0619 10/03/22 0548  WBC 10.3 8.0 6.9 7.0   Microbiology Recent Results (from the past 240 hour(s))  Aerobic Culture w Gram Stain (superficial specimen)     Status: None   Collection Time: 09/28/22 10:47 PM   Specimen: Wound  Result Value Ref Range Status   Specimen Description   Final    WOUND LEFT LOWER LEG Performed at [facility], [address], [place], [place] [address]    Special Requests   Final    NONE Performed at [facility], [address] [place], [place] [address]    Gram Stain   Final    FEW WBC PRESENT, PREDOMINANTLY MONONUCLEAR FEW GRAM POSITIVE COCCI IN PAIRS RARE GRAM NEGATIVE RODS Performed at [facility], [address] [place], [place] [address]    Culture   Final    ABUNDANT STAPHYLOCOCCUS AUREUS RARE PROTEUS MIRABILIS RARE KLEBSIELLA PNEUMONIAE RARE ESCHERICHIA COLI    Report Status 10/03/2022 FINAL  Final   Organism ID, Bacteria PROTEUS MIRABILIS  Final   Organism ID, Bacteria KLEBSIELLA PNEUMONIAE  Final   Organism ID, Bacteria ESCHERICHIA COLI  Final   Organism ID, Bacteria STAPHYLOCOCCUS AUREUS  Final      Susceptibility   Escherichia coli - MIC*    AMPICILLIN >=32 RESISTANT Resistant     CEFEPIME <=0.12 SENSITIVE Sensitive  CEFTAZIDIME <=1 SENSITIVE Sensitive     CEFTRIAXONE  <=0.25 SENSITIVE Sensitive     CIPROFLOXACIN <=0.25 SENSITIVE Sensitive     GENTAMICIN <=1 SENSITIVE Sensitive     IMIPENEM <=0.25 SENSITIVE Sensitive     TRIMETH/SULFA <=20 SENSITIVE Sensitive     AMPICILLIN/SULBACTAM 16 INTERMEDIATE Intermediate     PIP/TAZO <=4 SENSITIVE Sensitive     * RARE ESCHERICHIA COLI   Klebsiella pneumoniae - MIC*    AMPICILLIN RESISTANT Resistant     CEFEPIME <=0.12 SENSITIVE Sensitive     CEFTAZIDIME <=1 SENSITIVE Sensitive     CEFTRIAXONE <=0.25 SENSITIVE Sensitive     CIPROFLOXACIN <=0.25 SENSITIVE Sensitive     GENTAMICIN <=1 SENSITIVE Sensitive     IMIPENEM <=0.25 SENSITIVE Sensitive     TRIMETH/SULFA <=20 SENSITIVE Sensitive     AMPICILLIN/SULBACTAM 4 SENSITIVE Sensitive     PIP/TAZO <=4 SENSITIVE Sensitive     * RARE KLEBSIELLA PNEUMONIAE   Proteus mirabilis - MIC*    AMPICILLIN <=2 SENSITIVE Sensitive     CEFEPIME <=0.12 SENSITIVE Sensitive     CEFTAZIDIME <=1 SENSITIVE Sensitive     CEFTRIAXONE <=0.25 SENSITIVE Sensitive     CIPROFLOXACIN <=0.25 SENSITIVE Sensitive     GENTAMICIN <=1 SENSITIVE Sensitive     IMIPENEM 2 SENSITIVE Sensitive     TRIMETH/SULFA <=20 SENSITIVE Sensitive     AMPICILLIN/SULBACTAM <=2 SENSITIVE Sensitive     PIP/TAZO <=4 SENSITIVE Sensitive     * RARE PROTEUS MIRABILIS   Staphylococcus aureus - MIC*    CIPROFLOXACIN <=0.5 SENSITIVE Sensitive     ERYTHROMYCIN <=0.25 SENSITIVE Sensitive     GENTAMICIN <=0.5 SENSITIVE Sensitive     OXACILLIN <=0.25 SENSITIVE Sensitive     TETRACYCLINE <=1 SENSITIVE Sensitive     VANCOMYCIN <=0.5 SENSITIVE Sensitive     TRIMETH/SULFA <=10 SENSITIVE Sensitive     CLINDAMYCIN <=0.25 SENSITIVE Sensitive     RIFAMPIN <=0.5 SENSITIVE Sensitive     Inducible Clindamycin NEGATIVE Sensitive     LINEZOLID 1 SENSITIVE Sensitive     * ABUNDANT STAPHYLOCOCCUS AUREUS  MRSA Next Gen by PCR, Nasal     Status: None   Collection Time: 09/29/22  2:26 AM   Specimen: Nasal Mucosa; Nasal Swab   Result Value Ref Range Status   MRSA by PCR Next Gen NOT DETECTED NOT DETECTED Final    Comment: (NOTE) The GeneXpert MRSA Assay (FDA approved for NASAL specimens only), is one component of a comprehensive MRSA colonization surveillance program. It is not intended to diagnose MRSA infection nor to guide or monitor treatment for MRSA infections. Test performance is not FDA approved in patients less than 68 years old. Performed at [facility], [address], [place], [place] [address]     Procedures/Studies: ECHOCARDIOGRAM COMPLETE  Result Date: 09/30/2022    ECHOCARDIOGRAM REPORT   Patient Name:   JAQUITTA DUPRIEST Date of Exam: 09/30/2022 Medical Rec #:  [id number]     Height:       65.0 in Accession #:    [id number]    Weight:       268.0 lb Date of Birth:  1935/10/17    BSA:          2.240 m Patient Age:    86 years      BP:           103/59 mmHg Patient Gender: F             HR:  89 bpm. Exam Location:  Inpatient Procedure: 2D Echo, Cardiac Doppler and Color Doppler Indications:    Atrial Fibrillation I48.91  History:        Patient has prior history of Echocardiogram examinations, most                 recent 02/12/2017. TIA and Stroke, Arrythmias:Atrial                 Fibrillation, Signs/Symptoms:Shortness of Breath; Risk                 Factors:Hypertension and Sleep Apnea.  Sonographer:    Lucendia Herrlich Referring Phys: [id number] Krystelle Prashad IMPRESSIONS  1. Left ventricular ejection fraction, by estimation, is 60 to 65%. The left ventricle has normal function. The left ventricle has no regional wall motion abnormalities. Left ventricular diastolic parameters are indeterminate.  2. Right ventricular systolic function is normal. The right ventricular size is normal.  3. The mitral valve is grossly normal. No evidence of mitral valve regurgitation.  4. Tricuspid valve regurgitation is moderate.  5. The aortic valve is calcified. Aortic valve regurgitation is not visualized.  Moderate aortic valve stenosis.  6. The inferior vena cava is dilated in size with >50% respiratory variability, suggesting right atrial pressure of 8 mmHg. FINDINGS  Left Ventricle: Left ventricular ejection fraction, by estimation, is 60 to 65%. The left ventricle has normal function. The left ventricle has no regional wall motion abnormalities. The left ventricular internal cavity size was normal in size. There is  no left ventricular hypertrophy. Left ventricular diastolic parameters are indeterminate. Right Ventricle: The right ventricular size is normal. Right ventricular systolic function is normal. Left Atrium: Left atrial size was normal in size. Right Atrium: Right atrial size was normal in size. Pericardium: There is no evidence of pericardial effusion. Mitral Valve: The mitral valve is grossly normal. No evidence of mitral valve regurgitation. MV peak gradient, 9.6 mmHg. The mean mitral valve gradient is 3.0 mmHg. Tricuspid Valve: Tricuspid valve regurgitation is moderate. Aortic Valve: The aortic valve is calcified. Aortic valve regurgitation is not visualized. Moderate aortic stenosis is present. Aortic valve mean gradient measures 22.5 mmHg. Aortic valve peak gradient measures 37.3 mmHg. Aortic valve area, by VTI measures 0.70 cm. Pulmonic Valve: Pulmonic valve regurgitation is not visualized. Aorta: The aortic root and ascending aorta are structurally normal, with no evidence of dilitation. Venous: The inferior vena cava is dilated in size with greater than 50% respiratory variability, suggesting right atrial pressure of 8 mmHg. IAS/Shunts: No atrial level shunt detected by color flow Doppler.  LEFT VENTRICLE PLAX 2D LVIDd:         4.30 cm   Diastology LVIDs:         3.10 cm   LV e' medial:    9.03 cm/s LV PW:         0.90 cm   LV E/e' medial:  15.2 LV IVS:        0.90 cm   LV e' lateral:   8.05 cm/s LVOT diam:     1.80 cm   LV E/e' lateral: 17.0 LV SV:         45 LV SV Index:   20 LVOT Area:      2.54 cm  RIGHT VENTRICLE             IVC RV S prime:     14.80 cm/s  IVC diam: 2.90 cm TAPSE (M-mode): 1.7 cm LEFT ATRIUM  Index        RIGHT ATRIUM           Index LA diam:        3.80 cm 1.70 cm/m   RA Area:     15.80 cm LA Vol (A2C):   61.0 ml 27.23 ml/m  RA Volume:   33.50 ml  14.95 ml/m LA Vol (A4C):   65.4 ml 29.19 ml/m LA Biplane Vol: 64.0 ml 28.57 ml/m  AORTIC VALVE AV Area (Vmax):    0.78 cm AV Area (Vmean):   0.68 cm AV Area (VTI):     0.70 cm AV Vmax:           305.50 cm/s AV Vmean:          223.000 cm/s AV VTI:            0.644 m AV Peak Grad:      37.3 mmHg AV Mean Grad:      22.5 mmHg LVOT Vmax:         93.80 cm/s LVOT Vmean:        59.700 cm/s LVOT VTI:          0.176 m LVOT/AV VTI ratio: 0.27  AORTA Ao Root diam: 3.40 cm Ao Asc diam:  3.40 cm MITRAL VALVE                TRICUSPID VALVE MV Area (PHT): 3.31 cm     TR Peak grad:   44.4 mmHg MV Area VTI:   1.48 cm     TR Vmax:        333.00 cm/s MV Peak grad:  9.6 mmHg MV Mean grad:  3.0 mmHg     SHUNTS MV Vmax:       1.55 m/s     Systemic VTI:  0.18 m MV Vmean:      76.4 cm/s    Systemic Diam: 1.80 cm MV Decel Time: 229 msec MR Peak grad: 36.0 mmHg MR Vmax:      300.00 cm/s MV E velocity: 137.00 cm/s MV A velocity: 104.00 cm/s MV E/A ratio:  1.32 Photographer signed by Carolan Clines Signature Date/Time: 09/30/2022/5:15:26 PM    Final    VAS [place] ABI WITH/WO TBI  Result Date: 09/29/2022  LOWER EXTREMITY DOPPLER STUDY Patient Name:  RIE MCNEIL  Date of Exam:   09/29/2022 Medical Rec #: [id number]      Accession #:    [id number] Date of Birth: [birth year]     Patient Gender: F Patient Age:   29 years [facility]am [facility] Procedure:      VAS [place] ABI WITH/WO TBI Referring Phys: Jonny Ruiz DOUTOVA --------------------------------------------------------------------------------  Indications: BLE wounds (cellulitis) High Risk Factors: Hypertension, past history of smoking. Other Factors: TIA, lymphedema, venous  insufficiency.  Limitations: Today's exam was limited due to bandages , an open wound and              patient condition. Comparison Study: No previous exams Performing Technologist: Hill, Jody RVT, RDMS  Examination Guidelines: A complete evaluation includes at minimum, Doppler waveform signals and systolic blood pressure reading at the level of bilateral brachial, anterior tibial, and posterior tibial arteries, when vessel segments are accessible. Bilateral testing is considered an integral part of a complete examination. Photoelectric Plethysmograph (PPG) waveforms and toe systolic pressure readings are included as required and additional duplex testing as needed. Limited examinations for reoccurring indications may be performed as noted.  ABI Findings: +---------+------------------+-----+-----------+--------+  Right    Rt Pressure (mmHg)IndexWaveform   Comment  +---------+------------------+-----+-----------+--------+ Brachial 96                     triphasic           +---------+------------------+-----+-----------+--------+ PTA      112               1.17 multiphasic         +---------+------------------+-----+-----------+--------+ DP       123               1.28 multiphasic         +---------+------------------+-----+-----------+--------+ Great Toe74                0.77 Normal              +---------+------------------+-----+-----------+--------+ +---------+------------------+-----+-----------+-------+ Left     Lt Pressure (mmHg)IndexWaveform   Comment +---------+------------------+-----+-----------+-------+ Brachial 96                                        +---------+------------------+-----+-----------+-------+ PTA      117               1.22 multiphasic        +---------+------------------+-----+-----------+-------+ DP       105               1.09 multiphasic        +---------+------------------+-----+-----------+-------+ Great Toe67                 0.70 Normal             +---------+------------------+-----+-----------+-------+  Difficulty obtaining doppler waveforms due to tachycardia and afib.  Summary: Right: Resting right ankle-brachial index is within normal range. The right toe-brachial index is normal. Left: Resting left ankle-brachial index is within normal range. The left toe-brachial index is normal. *See table(s) above for measurements and observations.  Electronically signed by Gerarda Fraction on 09/29/2022 at 2:57:25 PM.    Final    DG Tibia/Fibula Right  Result Date: 09/28/2022 CLINICAL DATA:  Infection, bleeding from legs. Best obtainable images EXAM: RIGHT TIBIA AND FIBULA - 2 VIEW; RIGHT FOOT COMPLETE - 3+ VIEW COMPARISON:  Radiographs 07/08/2009 FINDINGS: Right TKA. No acute fracture or dislocation. Diffuse soft tissue swelling about the right lower extremity and foot. Overlying gauze/bandage material obscures detail at the ankle and midfoot. No definite osteomyelitis. IMPRESSION: Diffuse soft tissue swelling about the right lower extremity and foot. No definite osteomyelitis. Electronically Signed   By: Minerva Fester M.D.   On: 09/28/2022 20:07   DG Foot Complete Right  Result Date: 09/28/2022 CLINICAL DATA:  Infection, bleeding from legs. Best obtainable images EXAM: RIGHT TIBIA AND FIBULA - 2 VIEW; RIGHT FOOT COMPLETE - 3+ VIEW COMPARISON:  Radiographs 07/08/2009 FINDINGS: Right TKA. No acute fracture or dislocation. Diffuse soft tissue swelling about the right lower extremity and foot. Overlying gauze/bandage material obscures detail at the ankle and midfoot. No definite osteomyelitis. IMPRESSION: Diffuse soft tissue swelling about the right lower extremity and foot. No definite osteomyelitis. Electronically Signed   By: Minerva Fester M.D.   On: 09/28/2022 20:07   DG Tibia/Fibula Left  Result Date: 09/28/2022 CLINICAL DATA:  Bleeding from legs.  Infection. EXAM: LEFT TIBIA AND FIBULA - 2 VIEW; LEFT FOOT - COMPLETE 3+ VIEW  COMPARISON:  None Available. FINDINGS: Advanced arthritis left knee with near  complete loss of medial joint space. No acute fracture or dislocation. Soft tissue swelling about the left lower extremity and foot. Overlying gauze obscures fine detail in the lower extremity and foot. No definite osteomyelitis. IMPRESSION: Soft tissue swelling about the left lower extremity and foot. No definite osteomyelitis. Electronically Signed   By: Minerva Fester M.D.   On: 09/28/2022 20:05   DG Foot Complete Left  Result Date: 09/28/2022 CLINICAL DATA:  Bleeding from legs.  Infection. EXAM: LEFT TIBIA AND FIBULA - 2 VIEW; LEFT FOOT - COMPLETE 3+ VIEW COMPARISON:  None Available. FINDINGS: Advanced arthritis left knee with near complete loss of medial joint space. No acute fracture or dislocation. Soft tissue swelling about the left lower extremity and foot. Overlying gauze obscures fine detail in the lower extremity and foot. No definite osteomyelitis. IMPRESSION: Soft tissue swelling about the left lower extremity and foot. No definite osteomyelitis. Electronically Signed   By: Minerva Fester M.D.   On: 09/28/2022 20:05    Time coordinating discharge: 40 mins  SIGNED:  Carollee Herter, DO [facility] 10/03/22, 10:44 AM

## 2022-10-03 NOTE — Progress Notes (Signed)
Physical Therapy Treatment Patient Details Name: SAADIA DEWITT MRN: [id number] DOB: Oct 27, 1935 Today's Date: 10/03/2022   History of Present Illness patient is a 87 y.o. female who presented on 9/26 with bilateral leg wounds. Dx of cellulitis, chronic leg wounds, lymphedema, HTN.    PT Comments   Pt admitted with above diagnosis.  Pt currently with functional limitations due to the deficits listed below (see PT Problem List). Pt in bed when PT arrived. Pt agreeable to intervention. Pt indicated she was going to [facility] today but did not know what time. Pt required specific cues for safety and sequencing with increased time for processing. Pt is gregarious and often times difficult to redirect. Pt required mod A x 2 for supine to sit, exhibiting strong posterior lean EOB and PT unable to facilitate trunk flexion with multimodal cues, first standing trial with steady lift and mod A x 2 with pt maintaining standing for 4 mins, second standing trial with RW and mod A x 2 and pt able to progress with gait 4 feet, min A x 2 and close follow with hospital bed. Pt required mod A x 2 for sit to supine and max a x 2 to reposition in bed. Pt left in bed all needs in place. Pt will benefit from acute skilled PT to increase their independence and safety with mobility to allow discharge.      If plan is discharge home, recommend the following: Two people to help with bathing/dressing/bathroom;Two people to help with walking and/or transfers;Assistance with cooking/housework;Assist for transportation;Help with stairs or ramp for entrance   Can travel by private vehicle     No  Equipment Recommendations  Other (comment)    Recommendations for Other Services       Precautions / Restrictions Precautions Precautions: Fall Precaution Comments: also B LE wrapped for wound care adn swelling. 1 fall in past 6 months, unsure of pt's accuracy of history and PLOF Restrictions Weight Bearing Restrictions:  No Other Position/Activity Restrictions: B leg wraps     Mobility  Bed Mobility Overal bed mobility: Needs Assistance Bed Mobility: Sit to Supine, Supine to Sit     Supine to sit: Mod assist, Max assist, +2 for physical assistance, +2 for safety/equipment, Used rails Sit to supine: Max assist, +2 for physical assistance, +2 for safety/equipment   General bed mobility comments: pt required A  for B LEs with increased time pt able to demonstrate improved effort  and specific cues, control trunk with strong posterior lean once seated EOB with difficulty maintaining B LEs on floor, sit to supine with mod A for B LE and for trunk control with strong cues for pt to lower self onto R shoulder vs pt making attempt for trunk extension and perpendicular in bed, max A x 2 for repositioning in bed    Transfers Overall transfer level: Needs assistance Equipment used: Rolling walker (2 wheels) Transfers: Sit to/from Stand Sit to Stand: +2 physical assistance, Mod assist, From elevated surface, +2 safety/equipment           General transfer comment: pt seated EOB with strong posterior lean, PT unable to facilitate trunk flexion with multimodal cues with first standing trail PT placed steady in front of pt unable to reach the hand holds and reported she has to "climb" her hands up with side support and then requried min A to transition to proper location, pt required mod A x 2 for pull to stand at steady. once in standing pt  required CGA and cues for extension posture and coordinated breathing with O2 saturation 90% on RA. pt indicated no pain in standing and able to tolerate 4 mins. second standing trial at RW with pull to stand with mod A x 2 and cues R foot blocked and therapist and rehab tech providing assist/counter balance to maintain RW on floor, cues for power up and extension posture with kyphosis noted, pt then able to progress to short amb distance. Transfer via Lift Equipment:  Stedy  Ambulation/Gait Ambulation/Gait assistance: Min assist, +2 physical assistance, +2 safety/equipment Gait Distance (Feet): 4 Feet Assistive device: Rolling walker (2 wheels) Gait Pattern/deviations: Step-to pattern, Shuffle, Trunk flexed, Wide base of support, Decreased dorsiflexion - left, Decreased dorsiflexion - right Gait velocity: decreased     General Gait Details: gait trial with RW, A x 2 for safety and stability with hospital bed follow, shuffling pattern with excessive trunk/back flexion and slow cadence with reports of fatigue   Stairs             Wheelchair Mobility     Tilt Bed    Modified Rankin (Stroke Patients Only)       Balance Overall balance assessment: History of Falls, Needs assistance Sitting-balance support: Feet supported, Bilateral upper extremity supported Sitting balance-Leahy Scale: Poor   Postural control: Posterior lean Standing balance support: During functional activity, Reliant on assistive device for balance Standing balance-Leahy Scale: Poor Standing balance comment: stand was in the stedy lift equipment and then with RW                            Cognition Arousal: Alert Behavior During Therapy: Anxious Overall Cognitive Status: Within Functional Limits for tasks assessed                                 General Comments: Pt is hyperverbal and easily distracted and at times difficult to redirect.        Exercises      General Comments        Pertinent Vitals/Pain Pain Assessment Pain Assessment: No/denies pain    Home Living Family/patient expects to be discharged to:: Private residence Living Arrangements: Alone Available Help at Discharge: Available PRN/intermittently;Family Type of Home: House Home Access: Stairs to enter   [employer] of Steps: 3   Home Layout: One level Home Equipment: Shower seat;Toilet riser;Cane - single point;Rolling Walker (2 wheels)       Prior Function            PT Goals (current goals can now be found in the care plan section) Acute Rehab PT Goals Patient Stated Goal: knows she needs to go to rehab but ultimate goal is to return home . She wants to see her cat. PT Goal Formulation: With patient Time For Goal Achievement: 10/13/22 Potential to Achieve Goals: Good Progress towards PT goals: Progressing toward goals    Frequency    Min 1X/week      PT Plan      Co-evaluation              AM-PAC PT "6 Clicks" Mobility   Outcome Measure  Help needed turning from your back to your side while in a flat bed without using bedrails?: Total Help needed moving from lying on your back to sitting on the side of a flat bed without using bedrails?: Total Help needed  moving to and from a bed to a chair (including a wheelchair)?: Total Help needed standing up from a chair using your arms (e.g., wheelchair or bedside chair)?: Total Help needed to walk in hospital room?: Total Help needed climbing 3-5 steps with a railing? : Total 6 Click Score: 6    End of Session Equipment Utilized During Treatment: Gait belt Activity Tolerance: Patient tolerated treatment well Patient left: in bed;with call bell/phone within reach;with bed alarm set Nurse Communication: Mobility status;Need for lift equipment PT Visit Diagnosis: Muscle weakness (generalized) (M62.81);Other abnormalities of gait and mobility (R26.89);Difficulty in walking, not elsewhere classified (R26.2)     Time: 1610-9604 PT Time Calculation (min) (ACUTE ONLY): 39 min  Charges:    $Therapeutic Activity: 23-37 mins $Neuromuscular Re-education: 8-22 mins PT General Charges $$ ACUTE PT VISIT: 1 Visit                     Johnny Bridge, PT Acute Rehab   Jacqualyn Posey 10/03/2022, 3:48 PM

## 2023-03-18 ENCOUNTER — Encounter (HOSPITAL_COMMUNITY): Payer: Self-pay

## 2023-03-18 ENCOUNTER — Emergency Department (HOSPITAL_COMMUNITY)

## 2023-03-18 ENCOUNTER — Inpatient Hospital Stay (HOSPITAL_COMMUNITY)
Admission: EM | Admit: 2023-03-18 | Discharge: 2023-03-24 | DRG: 871 | Disposition: A | Discharge status: Skilled Nursing Facility | Attending: Internal Medicine | Admitting: Internal Medicine

## 2023-03-18 ENCOUNTER — Other Ambulatory Visit: Payer: Self-pay

## 2023-03-18 DIAGNOSIS — L89622 Pressure ulcer of left heel, stage 2: Secondary | ICD-10-CM

## 2023-03-18 DIAGNOSIS — Z79899 Other long term (current) drug therapy: Secondary | ICD-10-CM

## 2023-03-18 DIAGNOSIS — I480 Paroxysmal atrial fibrillation: Secondary | ICD-10-CM

## 2023-03-18 DIAGNOSIS — L03116 Cellulitis of left lower limb: Secondary | ICD-10-CM | POA: Diagnosis present

## 2023-03-18 DIAGNOSIS — A4159 Other Gram-negative sepsis: Principal | ICD-10-CM | POA: Diagnosis present

## 2023-03-18 DIAGNOSIS — I10 Essential (primary) hypertension: Secondary | ICD-10-CM | POA: Diagnosis present

## 2023-03-18 DIAGNOSIS — Z8249 Family history of ischemic heart disease and other diseases of the circulatory system: Secondary | ICD-10-CM | POA: Diagnosis not present

## 2023-03-18 DIAGNOSIS — Z96651 Presence of right artificial knee joint: Secondary | ICD-10-CM | POA: Diagnosis present

## 2023-03-18 DIAGNOSIS — L89623 Pressure ulcer of left heel, stage 3: Secondary | ICD-10-CM | POA: Diagnosis present

## 2023-03-18 DIAGNOSIS — B964 Proteus (mirabilis) (morganii) as the cause of diseases classified elsewhere: Secondary | ICD-10-CM | POA: Diagnosis present

## 2023-03-18 DIAGNOSIS — N179 Acute kidney failure, unspecified: Principal | ICD-10-CM

## 2023-03-18 DIAGNOSIS — A419 Sepsis, unspecified organism: Secondary | ICD-10-CM

## 2023-03-18 DIAGNOSIS — Z7901 Long term (current) use of anticoagulants: Secondary | ICD-10-CM | POA: Diagnosis not present

## 2023-03-18 DIAGNOSIS — I878 Other specified disorders of veins: Secondary | ICD-10-CM | POA: Diagnosis present

## 2023-03-18 DIAGNOSIS — E876 Hypokalemia: Secondary | ICD-10-CM | POA: Diagnosis present

## 2023-03-18 DIAGNOSIS — L03115 Cellulitis of right lower limb: Secondary | ICD-10-CM | POA: Diagnosis present

## 2023-03-18 DIAGNOSIS — L89322 Pressure ulcer of left buttock, stage 2: Secondary | ICD-10-CM | POA: Diagnosis present

## 2023-03-18 DIAGNOSIS — M170 Bilateral primary osteoarthritis of knee: Secondary | ICD-10-CM | POA: Diagnosis present

## 2023-03-18 DIAGNOSIS — Z1152 Encounter for screening for COVID-19: Secondary | ICD-10-CM

## 2023-03-18 DIAGNOSIS — Z8673 Personal history of transient ischemic attack (TIA), and cerebral infarction without residual deficits: Secondary | ICD-10-CM | POA: Diagnosis not present

## 2023-03-18 DIAGNOSIS — L89302 Pressure ulcer of unspecified buttock, stage 2: Secondary | ICD-10-CM | POA: Diagnosis not present

## 2023-03-18 DIAGNOSIS — Z87891 Personal history of nicotine dependence: Secondary | ICD-10-CM | POA: Diagnosis not present

## 2023-03-18 DIAGNOSIS — Z9841 Cataract extraction status, right eye: Secondary | ICD-10-CM

## 2023-03-18 DIAGNOSIS — D649 Anemia, unspecified: Secondary | ICD-10-CM | POA: Diagnosis not present

## 2023-03-18 DIAGNOSIS — L89316 Pressure-induced deep tissue damage of right buttock: Secondary | ICD-10-CM | POA: Diagnosis present

## 2023-03-18 DIAGNOSIS — I872 Venous insufficiency (chronic) (peripheral): Secondary | ICD-10-CM | POA: Diagnosis present

## 2023-03-18 DIAGNOSIS — I890 Lymphedema, not elsewhere classified: Secondary | ICD-10-CM

## 2023-03-18 DIAGNOSIS — I4891 Unspecified atrial fibrillation: Secondary | ICD-10-CM | POA: Diagnosis present

## 2023-03-18 DIAGNOSIS — R627 Adult failure to thrive: Secondary | ICD-10-CM | POA: Diagnosis present

## 2023-03-18 DIAGNOSIS — L89152 Pressure ulcer of sacral region, stage 2: Secondary | ICD-10-CM | POA: Diagnosis present

## 2023-03-18 DIAGNOSIS — Z9842 Cataract extraction status, left eye: Secondary | ICD-10-CM

## 2023-03-18 DIAGNOSIS — I959 Hypotension, unspecified: Secondary | ICD-10-CM | POA: Diagnosis not present

## 2023-03-18 DIAGNOSIS — L89151 Pressure ulcer of sacral region, stage 1: Secondary | ICD-10-CM | POA: Diagnosis present

## 2023-03-18 DIAGNOSIS — Z885 Allergy status to narcotic agent status: Secondary | ICD-10-CM

## 2023-03-18 LAB — URINALYSIS, W/ REFLEX TO CULTURE (INFECTION SUSPECTED)
Bilirubin Urine: NEGATIVE
Glucose, UA: NEGATIVE mg/dL
Ketones, ur: 5 mg/dL — AB
Nitrite: NEGATIVE
Protein, ur: 30 mg/dL — AB
Specific Gravity, Urine: 1.023 (ref 1.005–1.030)
pH: 5 (ref 5.0–8.0)

## 2023-03-18 LAB — CBC WITH DIFFERENTIAL/PLATELET
Abs Immature Granulocytes: 0.04 10*3/uL (ref 0.00–0.07)
Basophils Absolute: 0 10*3/uL (ref 0.0–0.1)
Basophils Relative: 0 %
Eosinophils Absolute: 0 10*3/uL (ref 0.0–0.5)
Eosinophils Relative: 0 %
HCT: 43.6 % (ref 36.0–46.0)
Hemoglobin: 14.8 g/dL (ref 12.0–15.0)
Immature Granulocytes: 0 %
Lymphocytes Relative: 8 %
Lymphs Abs: 1 10*3/uL (ref 0.7–4.0)
MCH: 33 pg (ref 26.0–34.0)
MCHC: 33.9 g/dL (ref 30.0–36.0)
MCV: 97.3 fL (ref 80.0–100.0)
Monocytes Absolute: 0.9 10*3/uL (ref 0.1–1.0)
Monocytes Relative: 8 %
Neutro Abs: 10.1 10*3/uL — ABNORMAL HIGH (ref 1.7–7.7)
Neutrophils Relative %: 84 %
Platelets: 212 10*3/uL (ref 150–400)
RBC: 4.48 MIL/uL (ref 3.87–5.11)
RDW: 13.2 % (ref 11.5–15.5)
WBC: 12.1 10*3/uL — ABNORMAL HIGH (ref 4.0–10.5)
nRBC: 0 % (ref 0.0–0.2)

## 2023-03-18 LAB — RESP PANEL BY RT-PCR (RSV, FLU A&B, COVID)  RVPGX2
Influenza A by PCR: NEGATIVE
Influenza B by PCR: NEGATIVE
Resp Syncytial Virus by PCR: NEGATIVE
SARS Coronavirus 2 by RT PCR: NEGATIVE

## 2023-03-18 LAB — COMPREHENSIVE METABOLIC PANEL
ALT: 38 U/L (ref 0–44)
AST: 69 U/L — ABNORMAL HIGH (ref 15–41)
Albumin: 2.4 g/dL — ABNORMAL LOW (ref 3.5–5.0)
Alkaline Phosphatase: 49 U/L (ref 38–126)
Anion gap: 16 — ABNORMAL HIGH (ref 5–15)
BUN: 30 mg/dL — ABNORMAL HIGH (ref 8–23)
CO2: 21 mmol/L — ABNORMAL LOW (ref 22–32)
Calcium: 8.7 mg/dL — ABNORMAL LOW (ref 8.9–10.3)
Chloride: 102 mmol/L (ref 98–111)
Creatinine, Ser: 1.05 mg/dL — ABNORMAL HIGH (ref 0.44–1.00)
GFR, Estimated: 52 mL/min — ABNORMAL LOW (ref >60)
Glucose, Bld: 94 mg/dL (ref 70–99)
Potassium: 3.4 mmol/L — ABNORMAL LOW (ref 3.5–5.1)
Sodium: 139 mmol/L (ref 135–145)
Total Bilirubin: 1.2 mg/dL (ref 0.0–1.2)
Total Protein: 5.8 g/dL — ABNORMAL LOW (ref 6.5–8.1)

## 2023-03-18 LAB — CK: Total CK: 674 U/L — ABNORMAL HIGH (ref 38–234)

## 2023-03-18 LAB — I-STAT CG4 LACTIC ACID, ED
Lactic Acid, Venous: 2.1 mmol/L (ref 0.5–1.9)
Lactic Acid, Venous: 1.7 mmol/L (ref 0.5–1.9)

## 2023-03-18 MED ORDER — SODIUM CHLORIDE 0.9 % IV SOLN: 1.0000 g | Freq: Once | INTRAVENOUS | Status: DC

## 2023-03-18 MED ORDER — POTASSIUM CHLORIDE CRYS ER 20 MEQ PO TBCR
40.0000 meq | EXTENDED_RELEASE_TABLET | Freq: Two times a day (BID) | ORAL | Status: AC
  Administered 2023-03-18 (×2): 40 meq via ORAL
  Filled 2023-03-18 (×2): qty 2

## 2023-03-18 MED ORDER — SODIUM CHLORIDE 0.9 % IV SOLN
2.0000 g | INTRAVENOUS | Status: DC
  Administered 2023-03-19 – 2023-03-24 (×6): 2 g via INTRAVENOUS
  Filled 2023-03-18 (×6): qty 20

## 2023-03-18 MED ORDER — ENSURE ENLIVE PO LIQD
237.0000 mL | Freq: Two times a day (BID) | ORAL | Status: DC
  Administered 2023-03-19 – 2023-03-24 (×7): 237 mL via ORAL

## 2023-03-18 MED ORDER — ZINC SULFATE 220 (50 ZN) MG PO CAPS: 220.0000 mg | ORAL_CAPSULE | Freq: Every day | ORAL | Status: DC

## 2023-03-18 MED ORDER — PIPERACILLIN-TAZOBACTAM 3.375 G IVPB 30 MIN: 3.3750 g | Freq: Once | INTRAVENOUS | Status: DC

## 2023-03-18 MED ORDER — ONDANSETRON HCL 4 MG/2ML IJ SOLN: 4.0000 mg | Freq: Four times a day (QID) | INTRAMUSCULAR | Status: DC | PRN

## 2023-03-18 MED ORDER — VANCOMYCIN HCL 2000 MG/400ML IV SOLN
2000.0000 mg | Freq: Once | INTRAVENOUS | Status: AC
  Administered 2023-03-18: 2000 mg via INTRAVENOUS
  Filled 2023-03-18: qty 400

## 2023-03-18 MED ORDER — VANCOMYCIN HCL IN DEXTROSE 1-5 GM/200ML-% IV SOLN
1000.0000 mg | INTRAVENOUS | Status: DC
  Administered 2023-03-19: 1000 mg via INTRAVENOUS
  Filled 2023-03-18: qty 200

## 2023-03-18 MED ORDER — SODIUM CHLORIDE 0.9 % IV SOLN
2.0000 g | Freq: Once | INTRAVENOUS | Status: AC
  Administered 2023-03-18: 2 g via INTRAVENOUS
  Filled 2023-03-18: qty 20

## 2023-03-18 MED ORDER — ONDANSETRON HCL 4 MG PO TABS: 4.0000 mg | ORAL_TABLET | Freq: Four times a day (QID) | ORAL | Status: DC | PRN

## 2023-03-18 MED ORDER — ACETAMINOPHEN 325 MG PO TABS: 650.0000 mg | ORAL_TABLET | Freq: Four times a day (QID) | ORAL | Status: DC | PRN

## 2023-03-18 MED ORDER — ACETAMINOPHEN 650 MG RE SUPP: 650.0000 mg | Freq: Four times a day (QID) | RECTAL | Status: DC | PRN

## 2023-03-18 MED ORDER — POLYETHYLENE GLYCOL 3350 17 G PO PACK: 17.0000 g | PACK | Freq: Every day | ORAL | Status: DC | PRN

## 2023-03-18 MED ORDER — CARVEDILOL 3.125 MG PO TABS: 3.1250 mg | ORAL_TABLET | Freq: Two times a day (BID) | ORAL | Status: DC

## 2023-03-18 MED ORDER — SODIUM CHLORIDE 0.9 % IV BOLUS
1000.0000 mL | Freq: Once | INTRAVENOUS | Status: AC
  Administered 2023-03-18: 1000 mL via INTRAVENOUS

## 2023-03-18 MED ORDER — VANCOMYCIN HCL IN DEXTROSE 1-5 GM/200ML-% IV SOLN: 1000.0000 mg | Freq: Once | INTRAVENOUS | Status: DC

## 2023-03-18 MED ORDER — SODIUM CHLORIDE 0.9 % IV SOLN: INTRAVENOUS | Status: DC

## 2023-03-18 MED ORDER — LINEZOLID 600 MG/300ML IV SOLN: 600.0000 mg | Freq: Once | INTRAVENOUS | Status: DC

## 2023-03-18 MED ORDER — APIXABAN 5 MG PO TABS
5.0000 mg | ORAL_TABLET | Freq: Two times a day (BID) | ORAL | Status: DC
  Administered 2023-03-19 – 2023-03-24 (×11): 5 mg via ORAL
  Filled 2023-03-18 (×11): qty 1

## 2023-03-18 MED ORDER — LACTATED RINGERS IV BOLUS
1000.0000 mL | Freq: Once | INTRAVENOUS | Status: AC
  Administered 2023-03-18: 1000 mL via INTRAVENOUS

## 2023-03-18 NOTE — ED Notes (Signed)
 Dr. Radonna Ricker paged by secretary.

## 2023-03-18 NOTE — ED Notes (Signed)
 Dr. Robb Matar informed of BP of 81/58. Pt remains AxO.

## 2023-03-18 NOTE — H&P (Signed)
 History and Physical  Shelby Wiley UYQ:[id number] DOB: October 25, 1935 DOA: 03/18/2023  PCP: Clayborn Heron, MD Patient coming from: Home  I have personally briefly reviewed patient's old medical records in [facility]   Chief Complaint: Generalized weakness  HPI: Shelby Wiley is a 88 y.o. female past medical history of chronic leg wounds, mild lymphedema who had an Unna boot about 2 months prior to admission, essential hypertension comes in for generalized weakness and confusion according to ED's and EMS report she was last seen by an individual about 4 days prior to admission one of her friends called EMS and found her in the toilet covered in stool and urine disheveled.  She has had lower extremity pain and erythema.  We believe she had been sitting there for the last 3 to 4 days.  The pain is mostly in her right leg where the bandage was were removed.   In the ED:  she was found to be afebrile with a white count of 12,000, blood cultures were obtained she was started empirically on IV vancomycin and Rocephin.  And acute kidney injury  Review of Systems: All systems reviewed and apart from history of presenting illness, are negative.  Past Medical History:  Diagnosis Date   Arthritis    PAIN AND OA BOTH KNEES   Hypertension    Shortness of breath    PT RELATES TO HER WEIGHT-OUT OF SHAPE   Sleep apnea    STOP BANG SCORE 5   Stroke (HCC)    POSS TIA IN 2012    Past Surgical History:  Procedure Laterality Date   EYE SURGERY     BILATERAL CATARACT EXTRACTION   LEFT KNEE ARTHROSCOPY     TOTAL KNEE ARTHROPLASTY  04/17/2011   Procedure: TOTAL KNEE ARTHROPLASTY;  Surgeon: Loanne Drilling, MD;  Location: [facility];  Service: Orthopedics;  Laterality: Right;   Social History:  reports that she has quit smoking. Her smoking use included cigarettes. She has a 11.3 pack-year smoking history. She has never used smokeless tobacco. She reports current alcohol use. She reports that  she does not use drugs.   Allergies  Allergen Reactions   Codeine Nausea And Vomiting and Other (See Comments)    Caused GI upset    Family History  Problem Relation Age of Onset   Heart attack Mother     Prior to Admission medications   Medication Sig Start Date End Date Taking? Authorizing Provider  amoxicillin (AMOXIL) 500 MG capsule Take 2,000 mg by mouth See admin instructions. Take 2,000 mg by mouth one hour prior to dental appointments.    [provider]  apixaban (ELIQUIS) 5 MG TABS tablet Take 1 tablet (5 mg total) by mouth 2 (two) times daily. 10/01/22   Carollee Herter, DO  ascorbic acid (VITAMIN C) 500 MG tablet Take 1 tablet (500 mg total) by mouth daily. 10/02/22   Carollee Herter, DO  carvedilol (COREG) 3.125 MG tablet Take 1 tablet (3.125 mg total) by mouth 2 (two) times daily with a meal. 10/01/22   Carollee Herter, DO  Multiple Vitamin (MULTIVITAMIN) tablet Take 1 tablet by mouth daily with breakfast.    [provider]  nutrition supplement, JUVEN, (JUVEN) PACK Take 1 packet by mouth 2 (two) times daily between meals. 10/01/22   Carollee Herter, DO  zinc sulfate 220 (50 Zn) MG capsule Take 1 capsule (220 mg total) by mouth daily. 10/02/22   Carollee Herter, DO  Physical Exam: Vitals:   03/18/23 1020 03/18/23 1035 03/18/23 1045 03/18/23 1100  BP:  98/61 91/62 102/71  Pulse:  96 66 98  Resp:  17 19 16   Temp: (!) 93.5 F (34.2 C)     TempSrc: Rectal     SpO2:  100% 100% 100%    General exam: Moderately built and nourished patient, lying comfortably supine, appears disheveled Head, eyes and ENT: Nontraumatic and normocephalic.  Neck: Supple. No JVD, carotid bruit or thyromegaly. Lymphatics: No lymphadenopathy. Respiratory system: Clear to auscultation. No increased work of breathing. Cardiovascular system: S1 and S2 heard, RRR. No JVD, murmurs, gallops, clicks or pedal edema. Gastrointestinal system: Bowel sounds soft nontender nondistended Central nervous system:  Alert and oriented. No focal neurological deficits. Extremities: Symmetric 5 x 5 power. Peripheral pulses symmetrically felt.  Skin: Mild notable lower extremity edema the right more prominent than the left, warm to touch and tender Musculoskeletal system: Negative exam. Psychiatry: Pleasant and cooperative.   Labs on Admission:  Basic Metabolic Panel: Recent Labs  Lab 03/18/23 1030  NA 139  K 3.4*  CL 102  CO2 21*  GLUCOSE 94  BUN 30*  CREATININE 1.05*  CALCIUM 8.7*   Liver Function Tests: Recent Labs  Lab 03/18/23 1030  AST 69*  ALT 38  ALKPHOS 49  BILITOT 1.2  PROT 5.8*  ALBUMIN 2.4*   No results for input(s): "LIPASE", "AMYLASE" in the last 168 hours. No results for input(s): "AMMONIA" in the last 168 hours. CBC: Recent Labs  Lab 03/18/23 1030  WBC 12.1*  NEUTROABS 10.1*  HGB 14.8  HCT 43.6  MCV 97.3  PLT 212   Cardiac Enzymes: Recent Labs  Lab 03/18/23 1030  CKTOTAL 674*    BNP (last 3 results) No results for input(s): "PROBNP" in the last 8760 hours. CBG: No results for input(s): "GLUCAP" in the last 168 hours.  Radiological Exams on Admission: DG Foot 2 Views Left Result Date: 03/18/2023 CLINICAL DATA:  Foot ulcer.  Assess for possible osteomyelitis. EXAM: LEFT FOOT - 2 VIEW COMPARISON:  09/28/2022. FINDINGS: Ulcer location not definitive on the radiographs. On the lateral view, there is an apparent fracture of the distal aspect of the proximal phalanx of what is likely the fifth toe. No other evidence of a fracture. No definite bone resorption. Joints are normally aligned. There is soft tissue swelling most prominent along the forefoot. No soft tissue air. Calcification is noted along the Achilles tendon and plantar fascia. IMPRESSION: 1. Apparent fracture of the distal aspect of the proximal phalanx of what is likely the fifth toe, which could be the result of osteomyelitis, although this is not well-defined on this exam. There is no other evidence  to suggest osteomyelitis. 2. Soft tissue swelling. No soft tissue air. Electronically Signed   By: Amie Portland M.D.   On: 03/18/2023 11:27   DG Chest Port 1 View Result Date: 03/18/2023 CLINICAL DATA:  Possible sepsis. EXAM: PORTABLE CHEST 1 VIEW COMPARISON:  04/06/2011. FINDINGS: Cardiac silhouette is normal in size. No mediastinal or hilar masses. Mild chronic peripheral interstitial thickening. Lungs hyperexpanded but otherwise clear. No convincing pleural effusion.  No pneumothorax. Skeletal structures are grossly intact. IMPRESSION: 1. No no acute cardiopulmonary disease. Electronically Signed   By: Amie Portland M.D.   On: 03/18/2023 11:23    EKG: Independently reviewed.  A-fib with RVR normal axis.   Assessment/Plan Cellulitis of right lower extremity: Agree with blood cultures sent by the ED, continue IV Rocephin  and vancomycin. Try to keep legs elevated above the heart level. Consult wound care. Continue to monitor blood cultures closely. PT OT consult, anticipate will need skilled nursing facility. Bathe patient  Acute kidney injury: Likely hemodynamically mediated.  With a baseline creatinine of less than 1 Will give her a bolus of normal saline, and continue IV fluids. Monitor strict I's and O's and daily weights. Recheck basic metabolic panel in the morning.  Hypokalemia Replete orally recheck in the morning.  Bilateral lymphedema Consult wound care will need to follow-up with the wound care clinic.  Paroxysmal A-fib with RVR: Hydrate her aggressively resume her Coreg. Resume her Eliquis. RVR is mild hopefully will correct with IV fluids.  OBESITY, MORBID Noted.  Disposition: Patient can probably not take care of herself at home, anticipate she will need skilled nursing facility. She appears disheveled    DVT Prophylaxis: Eliquis Code Status: full  Family Communication: none  Disposition Plan: inpatient      It is my clinical opinion that admission to  INPATIENT is reasonable and necessary in this 88 y.o. female right lower extremity cellulitis, disheveled she can no longer take care of herself at home.  Given the aforementioned, the predictability of an adverse outcome is felt to be significant. I expect that the patient will require at least 2 midnights in the hospital to treat this condition.  Marinda Elk MD [facility]   03/18/2023, 12:36 PM

## 2023-03-18 NOTE — ED Notes (Signed)
 Bair hugger applied.

## 2023-03-18 NOTE — ED Provider Notes (Signed)
 [place] EMERGENCY DEPARTMENT AT [facility] Provider Note   CSN: [id number] Arrival date & time: 03/18/23  4098     History  Chief Complaint  Patient presents with   Altered Mental Status   Wound Check    Shelby Wiley is a 88 y.o. female.  88 year old female with a history of lower extremity edema who presents to the emergency department after being found sitting on the toilet for several days.  Last seen by other individuals 4 to 5 days ago.  When EMS arrived she was sitting on a toilet.  She was covered in stool and urine.  House appeared very disheveled.  They thought that she may have been sitting there the entire time.  Patient unable to give additional history at this time.  Says her only pain is in her right leg where she had a bandage that was removed.       Home Medications Prior to Admission medications   Medication Sig Start Date End Date Taking? Authorizing Provider  apixaban (ELIQUIS) 5 MG TABS tablet Take 1 tablet (5 mg total) by mouth 2 (two) times daily. 10/01/22   Carollee Herter, DO  ascorbic acid (VITAMIN C) 500 MG tablet Take 1 tablet (500 mg total) by mouth daily. 10/02/22   Carollee Herter, DO  carvedilol (COREG) 3.125 MG tablet Take 1 tablet (3.125 mg total) by mouth 2 (two) times daily with a meal. 10/01/22   Carollee Herter, DO  nutrition supplement, JUVEN, (JUVEN) PACK Take 1 packet by mouth 2 (two) times daily between meals. 10/01/22   Carollee Herter, DO  zinc sulfate 220 (50 Zn) MG capsule Take 1 capsule (220 mg total) by mouth daily. 10/02/22   Carollee Herter, DO      Allergies    Codeine    Review of Systems   Review of Systems  Physical Exam Updated Vital Signs BP 102/71   Pulse 98   Temp 98 F (36.7 C) (Oral)   Resp 16   SpO2 100%  Physical Exam Vitals and nursing note reviewed.  Constitutional:      General: She is not in acute distress.    Appearance: She is well-developed.     Comments: Alert and oriented to person, place, president but did  not know the year.  Conversant.  Disheveled appearing.  Covered in urine and feces.  HENT:     Head: Normocephalic and atraumatic.     Right Ear: External ear normal.     Left Ear: External ear normal.     Nose: Nose normal.  Eyes:     Extraocular Movements: Extraocular movements intact.     Conjunctiva/sclera: Conjunctivae normal.     Pupils: Pupils are equal, round, and reactive to light.  Cardiovascular:     Rate and Rhythm: Normal rate and regular rhythm.     Heart sounds: No murmur heard. Pulmonary:     Effort: Pulmonary effort is normal. No respiratory distress.     Breath sounds: Normal breath sounds.  Genitourinary:    Comments: No erythema in the perineum.  No subcutaneous emphysema palpated.  No tenderness to palpation of the perineum. Musculoskeletal:     Cervical back: Normal range of motion and neck supple.     Right lower leg: Edema present.     Left lower leg: Edema present.     Comments: Old bandages that were soiled were removed on both of her legs.  Does have a stage II pressure ulcer of the  left heel.  Erythema of the right anterior shin.  Skin:    General: Skin is warm and dry.  Neurological:     Mental Status: She is alert and oriented to person, place, and time. Mental status is at baseline.  Psychiatric:        Mood and Affect: Mood normal.    Buttocks:   ED Results / Procedures / Treatments   Labs (all labs ordered are listed, but only abnormal results are displayed) Labs Reviewed  COMPREHENSIVE METABOLIC PANEL - Abnormal; Notable for the following components:      Result Value   Potassium 3.4 (*)    CO2 21 (*)    BUN 30 (*)    Creatinine, Ser 1.05 (*)    Calcium 8.7 (*)    Total Protein 5.8 (*)    Albumin 2.4 (*)    AST 69 (*)    GFR, Estimated 52 (*)    Anion gap 16 (*)    All other components within normal limits  CBC WITH DIFFERENTIAL/PLATELET - Abnormal; Notable for the following components:   WBC 12.1 (*)    Neutro Abs 10.1 (*)     All other components within normal limits  CK - Abnormal; Notable for the following components:   Total CK 674 (*)    All other components within normal limits  I-STAT CG4 LACTIC ACID, ED - Abnormal; Notable for the following components:   Lactic Acid, Venous 2.1 (*)    All other components within normal limits  RESP PANEL BY RT-PCR (RSV, FLU A&B, COVID)  RVPGX2  CULTURE, BLOOD (ROUTINE X 2)  CULTURE, BLOOD (ROUTINE X 2)  URINALYSIS, W/ REFLEX TO CULTURE (INFECTION SUSPECTED)  I-STAT CG4 LACTIC ACID, ED    EKG EKG Interpretation Date/Time:  Sunday March 18 2023 10:25:39 EDT Ventricular Rate:  111 PR Interval:    QRS Duration:  98 QT Interval:  365 QTC Calculation: 496 R Axis:   -55  Text Interpretation: Atrial fibrillation Paired ventricular premature complexes Inferior infarct, old Probable anterior infarct, age indeterminate Artifact in lead(s) I II aVR aVL aVF V2 Interpretation limited secondary to artifact Confirmed by Vonita Moss [phone]) on 03/18/2023 10:33:29 AM  Radiology DG Foot 2 Views Left Result Date: 03/18/2023 CLINICAL DATA:  Foot ulcer.  Assess for possible osteomyelitis. EXAM: LEFT FOOT - 2 VIEW COMPARISON:  09/28/2022. FINDINGS: Ulcer location not definitive on the radiographs. On the lateral view, there is an apparent fracture of the distal aspect of the proximal phalanx of what is likely the fifth toe. No other evidence of a fracture. No definite bone resorption. Joints are normally aligned. There is soft tissue swelling most prominent along the forefoot. No soft tissue air. Calcification is noted along the Achilles tendon and plantar fascia. IMPRESSION: 1. Apparent fracture of the distal aspect of the proximal phalanx of what is likely the fifth toe, which could be the result of osteomyelitis, although this is not well-defined on this exam. There is no other evidence to suggest osteomyelitis. 2. Soft tissue swelling. No soft tissue air. Electronically Signed   By:  Amie Portland M.D.   On: 03/18/2023 11:27   DG Chest Port 1 View Result Date: 03/18/2023 CLINICAL DATA:  Possible sepsis. EXAM: PORTABLE CHEST 1 VIEW COMPARISON:  04/06/2011. FINDINGS: Cardiac silhouette is normal in size. No mediastinal or hilar masses. Mild chronic peripheral interstitial thickening. Lungs hyperexpanded but otherwise clear. No convincing pleural effusion.  No pneumothorax. Skeletal structures are grossly intact. IMPRESSION: 1.  No no acute cardiopulmonary disease. Electronically Signed   By: Amie Portland M.D.   On: 03/18/2023 11:23    Procedures Procedures    Medications Ordered in ED Medications  vancomycin (VANCOREADY) IVPB 2000 mg/400 mL (2,000 mg Intravenous New Bag/Given 03/18/23 1103)  0.9 %  sodium chloride infusion ( Intravenous New Bag/Given 03/18/23 1246)  potassium chloride SA (KLOR-CON M) CR tablet 40 mEq (40 mEq Oral Given 03/18/23 1246)  cefTRIAXone (ROCEPHIN) 2 g in sodium chloride 0.9 % 100 mL IVPB (has no administration in time range)  cefTRIAXone (ROCEPHIN) 2 g in sodium chloride 0.9 % 100 mL IVPB (0 g Intravenous Stopped 03/18/23 1101)  lactated ringers bolus 1,000 mL (0 mLs Intravenous Stopped 03/18/23 1245)    ED Course/ Medical Decision Making/ A&P Clinical Course as of 03/18/23 1258  Sun Mar 18, 2023  1132 Creatinine(!): 1.05 Baseline 0.6 [RP]  1236 Dr Robb Matar from hospitalist to admit the patient.  [RP]    Clinical Course User Index [RP] Rondel Baton, MD                                 Medical Decision Making Amount and/or Complexity of Data Reviewed Labs: ordered. Decision-making details documented in ED Course. Radiology: ordered.  Risk Prescription drug management. Decision regarding hospitalization.   Shelby Wiley is a 88 y.o. female with comorbidities that complicate the patient evaluation including lower extremity edema presents to the emergency department after being found sitting on the toilet for several days  Initial  Ddx:  AKI, rhabdomyolysis, cellulitis, pressure ulcer, necrotizing fasciitis, sepsis  MDM/Course:  Patient presents emergency department after being found sitting on toilet for 5 days.  Generally weak but no focal neurologic deficits.  Does not know the year but is otherwise alert and oriented.  Does have a pressure ulcer on her left heel that appears to be stage II.  Also has some pressure ulcers on her buttocks that are shown above.  No signs of Fournier's gangrene at this time.  Was hypothermic with elevated white blood cell count so had culture sent and was started on antibiotics for cellulitis which is present on her right lower extremity.  Labs show very mild rhabdomyolysis and AKI.  Given IV fluids and admitted to hospitalist for further management.  Chest x-ray without acute findings.  X-ray of the foot without evidence of osteo to the heel but did show that possibly of the fifth toe and suspect that she may not have osteo since I am unable to probe to bone in that area.  Upon re-evaluation was stable  This patient presents to the ED for concern of complaints listed in HPI, this involves an extensive number of treatment options, and is a complaint that carries with it a high risk of complications and morbidity. Disposition including potential need for admission considered.   Dispo: Admit to Floor  Additional history obtained from EMS Records reviewed Outpatient Clinic Notes The following labs were independently interpreted: Chemistry and show AKI I independently reviewed the following imaging with scope of interpretation limited to determining acute life threatening conditions related to emergency care: Chest x-ray and agree with the radiologist interpretation with the following exceptions: none I personally reviewed and interpreted cardiac monitoring: normal sinus rhythm  I personally reviewed and interpreted the pt's EKG: see above for interpretation  I have reviewed the patients home  medications and made adjustments as needed Consults: Hospitalist  Social Determinants of health:  Elderly  Portions of this note were generated with Scientist, clinical (histocompatibility and immunogenetics). Dictation errors may occur despite best attempts at proofreading.     Final Clinical Impression(s) / ED Diagnoses Final diagnoses:  AKI (acute kidney injury) (HCC)  Cellulitis of right lower extremity  Pressure injury of buttock, stage 2, unspecified laterality (HCC)  Pressure ulcer of left heel, stage 2 (HCC)  Sepsis, due to unspecified organism, unspecified whether acute organ dysfunction present (HCC)  Failure to thrive in adult    Rx / DC Orders ED Discharge Orders     None         Rondel Baton, MD 03/18/23 1258

## 2023-03-18 NOTE — TOC Initial Note (Signed)
 Transition of Care [facility]) - Initial/Assessment Note    Patient Details  Name: Shelby Wiley MRN: [id number] Date of Birth: 16-May-1935  Transition of Care [facility]) CM/SW Contact:    Carmina Miller, LCSWA Phone Number: 03/18/2023, 1:23 PM  Clinical Narrative:                  CSW received consult to assess for safety. CSW at bedside to speak with pt, pt confirms that she was stuck on the toilet for a long time but couldn't remember how long (from EMS report it was a number of days). Pt provided with a bagged lunch, stated she was very hungry and didn't remember the last time she ate or drank. Pt states she knows her friend Henreitta Cea, who lives in [place], must have requested a welfare check due to not being able to get in touch with her as they speak on the phone daily. Pt states she is agreeable to STR if recommended and knows she needs to build her strength up. Pt stated she was thankful for Henreitta Cea as she knows that he was the one who saved her life, she stated she usually takes her phone with her in the house but she forgot to grab it while heading in the bathroom. Pt states she normally ambulates in her home independently but uses a cane from time to time.   CSW is familiar with pt from a previous encounter, knows that pt unfortunately has no other family or friends. Per RN report from EMS home was dirty and inhabitable and an APS report was going to be made. CSW spoke with Geraldine Solar with [facility] APS, he confirmed no report has been made as of yet, CSW went ahead and made an APS report based off of what was reported to ED staff. TOC will continue to follow.         Patient Goals and CMS Choice            Expected Discharge Plan and Services                                              Prior Living Arrangements/Services                       Activities of Daily Living      Permission Sought/Granted                  Emotional Assessment               Admission diagnosis:  Cellulitis of left lower extremity [L03.116] Cellulitis of right lower extremity [L03.115] Patient Active Problem List   Diagnosis Date Noted   Cellulitis of left lower extremity 03/18/2023   AKI (acute kidney injury) (HCC) 03/18/2023   Cellulitis of right lower extremity 03/18/2023   Moderate aortic stenosis - mean gradient 22.5  mHg. peak gradient 37.3  mmHg 10/01/2022   Bilateral cellulitis of lower leg 09/28/2022   A-fib (HCC) 09/28/2022   Pain due to onychomycosis of toenails of both feet 05/27/2019   Venous (peripheral) insufficiency 05/27/2019   Lymphedema 05/27/2019   Hypokalemia 04/20/2011   OA (osteoarthritis) of knee 04/17/2011   TIA 11/02/2009   HYPERGLYCEMIA, BORDERLINE 07/20/2009   EYE FLOATERS, LEFT 07/02/2009   FATIGUE 07/02/2009   WART, VIRAL 11/20/2008   Vitreous degeneration 11/20/2008  HEMORRHOIDS, INTERNAL 06/25/2008   EXTERNAL HEMORRHOIDS 06/25/2008   KERATOSIS PILARIS 06/25/2008   HYPERTENSION, BORDERLINE 02/26/2007   SHOULDER PAIN, RIGHT 02/26/2007   OBESITY, MORBID 05/05/2006   INSUFFICIENCY, VENOUS NOS 05/05/2006   OSTEOARTHRITIS 05/05/2006   DIVERTICULOSIS, COLON 12/08/2003   PCP:  Clayborn Heron, MD Pharmacy:   [facility] [phone] [place], [place] - [address] [place] [address] Phone: [phone] Fax: [phone]     Social Drivers of Health (SDOH) Social History: SDOH Screenings   Food Insecurity: No Food Insecurity (09/29/2022)  Housing: Low Risk  (09/29/2022)  Transportation Needs: No Transportation Needs (09/29/2022)  Utilities: Not At Risk (09/29/2022)  Tobacco Use: Medium Risk (03/18/2023)   SDOH Interventions:     Readmission Risk Interventions    09/29/2022    4:02 PM  Readmission Risk Prevention Plan  Post Dischage Appt Complete  Medication Screening Complete  Transportation Screening Complete

## 2023-03-18 NOTE — Progress Notes (Signed)
 Pharmacy Antibiotic Note  Shelby Wiley is a 88 y.o. female for which pharmacy has been consulted for vancomycin dosing for cellulitis of rt lower extremity.  Patient with a history of chronic leg wounds, mild lymphedem, HTN, stroke. Patient presenting with generalized weakness.  SCr 1.05 - baseline ~0.7 WBC 12.1; LA 2.1; T 98; HR 98; RR 16 COVID neg / flu neg CK 674  Plan: Ceftriaxone per MD Vancomycin 2000 mg once then 1000 mg q24hr (eAUC 503.6) unless change in renal function (Estimating weight to be ~118kg based off historical records, Using IBW, VD 0.5) Monitor WBC, fever, renal function, cultures De-escalate when able Levels at steady state     Temp (24hrs), Avg:93.5 F (34.2 C), Min:93.5 F (34.2 C), Max:93.5 F (34.2 C)  Recent Labs  Lab 03/18/23 1030 03/18/23 1044  WBC 12.1*  --   CREATININE 1.05*  --   LATICACIDVEN  --  2.1*    CrCl cannot be calculated (Unknown ideal weight.).    Allergies  Allergen Reactions   Codeine Nausea And Vomiting and Other (See Comments)    Caused GI upset   Microbiology results: Pending  Thank you for allowing pharmacy to be a part of this patient's care.  Delmar Landau, PharmD, BCPS 03/18/2023 12:48 PM ED Clinical Pharmacist -  [phone]

## 2023-03-18 NOTE — ED Triage Notes (Signed)
 PT arrives via EMS from home. A welfare check occurred for the patient, she was found on the toilet. PT had possibly sitting there for the last 4-5 days. Last time family spoke to patient was 4-5 days ago. PT has chronic wounds on both legs. She is AxO to name, dob, and location.

## 2023-03-19 DIAGNOSIS — L89302 Pressure ulcer of unspecified buttock, stage 2: Secondary | ICD-10-CM

## 2023-03-19 DIAGNOSIS — R627 Adult failure to thrive: Secondary | ICD-10-CM

## 2023-03-19 DIAGNOSIS — N179 Acute kidney failure, unspecified: Secondary | ICD-10-CM | POA: Diagnosis not present

## 2023-03-19 DIAGNOSIS — L03116 Cellulitis of left lower limb: Secondary | ICD-10-CM | POA: Diagnosis not present

## 2023-03-19 DIAGNOSIS — L89622 Pressure ulcer of left heel, stage 2: Secondary | ICD-10-CM

## 2023-03-19 LAB — BLOOD CULTURE ID PANEL (REFLEXED) - BCID2

## 2023-03-19 LAB — URINE CULTURE: Culture: NO GROWTH

## 2023-03-19 LAB — COMPREHENSIVE METABOLIC PANEL
ALT: 27 U/L (ref 0–44)
AST: 38 U/L (ref 15–41)
Albumin: 1.7 g/dL — ABNORMAL LOW (ref 3.5–5.0)
Alkaline Phosphatase: 36 U/L — ABNORMAL LOW (ref 38–126)
Anion gap: 8 (ref 5–15)
BUN: 30 mg/dL — ABNORMAL HIGH (ref 8–23)
CO2: 23 mmol/L (ref 22–32)
Calcium: 7.5 mg/dL — ABNORMAL LOW (ref 8.9–10.3)
Chloride: 106 mmol/L (ref 98–111)
Creatinine, Ser: 1.11 mg/dL — ABNORMAL HIGH (ref 0.44–1.00)
GFR, Estimated: 49 mL/min — ABNORMAL LOW (ref >60)
Glucose, Bld: 131 mg/dL — ABNORMAL HIGH (ref 70–99)
Potassium: 3.1 mmol/L — ABNORMAL LOW (ref 3.5–5.1)
Sodium: 137 mmol/L (ref 135–145)
Total Bilirubin: 0.7 mg/dL (ref 0.0–1.2)
Total Protein: 4.2 g/dL — ABNORMAL LOW (ref 6.5–8.1)

## 2023-03-19 LAB — CBC
HCT: 32.5 % — ABNORMAL LOW (ref 36.0–46.0)
Hemoglobin: 10.8 g/dL — ABNORMAL LOW (ref 12.0–15.0)
MCH: 32.3 pg (ref 26.0–34.0)
MCHC: 33.2 g/dL (ref 30.0–36.0)
MCV: 97.3 fL (ref 80.0–100.0)
Platelets: 185 10*3/uL (ref 150–400)
RBC: 3.34 MIL/uL — ABNORMAL LOW (ref 3.87–5.11)
RDW: 13.4 % (ref 11.5–15.5)
WBC: 9.7 10*3/uL (ref 4.0–10.5)
nRBC: 0 % (ref 0.0–0.2)

## 2023-03-19 LAB — CK: Total CK: 422 U/L — ABNORMAL HIGH (ref 38–234)

## 2023-03-19 MED ORDER — MIDODRINE HCL 5 MG PO TABS: 2.5000 mg | ORAL_TABLET | Freq: Three times a day (TID) | ORAL | Status: DC

## 2023-03-19 MED ORDER — CHLORHEXIDINE GLUCONATE CLOTH 2 % EX PADS
6.0000 | MEDICATED_PAD | Freq: Every day | CUTANEOUS | Status: DC
  Administered 2023-03-20 – 2023-03-24 (×5): 6 via TOPICAL

## 2023-03-19 MED ORDER — POTASSIUM CHLORIDE IN NACL 40-0.9 MEQ/L-% IV SOLN
INTRAVENOUS | Status: DC
  Filled 2023-03-19: qty 1000

## 2023-03-19 MED ORDER — POTASSIUM CHLORIDE IN NACL 40-0.9 MEQ/L-% IV SOLN
INTRAVENOUS | Status: AC
  Filled 2023-03-19: qty 1000

## 2023-03-19 MED ORDER — MIDODRINE HCL 5 MG PO TABS
5.0000 mg | ORAL_TABLET | Freq: Three times a day (TID) | ORAL | Status: DC
  Administered 2023-03-19 (×3): 5 mg via ORAL
  Filled 2023-03-19 (×3): qty 1

## 2023-03-19 MED ORDER — MIDODRINE HCL 5 MG PO TABS
10.0000 mg | ORAL_TABLET | Freq: Three times a day (TID) | ORAL | Status: DC
  Administered 2023-03-19 – 2023-03-24 (×14): 10 mg via ORAL
  Filled 2023-03-19 (×14): qty 2

## 2023-03-19 MED ORDER — POTASSIUM CHLORIDE CRYS ER 20 MEQ PO TBCR
40.0000 meq | EXTENDED_RELEASE_TABLET | Freq: Two times a day (BID) | ORAL | Status: AC
  Administered 2023-03-19 (×2): 40 meq via ORAL
  Filled 2023-03-19 (×2): qty 2

## 2023-03-19 NOTE — Progress Notes (Signed)
   03/19/23 1100  Spiritual Encounters  Type of Visit Initial  Care provided to: Patient  Referral source Patient request  Reason for visit Routine spiritual support  OnCall Visit No   Chaplain responded to patient's request for prayer. Pt stated that she had been in the bathroom for four days, and she couldn't get up to reach out some friends to get some help. After four days, her friend Jillyn Hidden came to her home and the ambulance brought her to the hospital. She also concerns about her legs pain. She wants to go home and get the contact list to call her friends. She thinks that she was in the different hospital then transported here to [facility]. She mentioned that she was in good care when she was in the other hospital, but she is not happy with the care that she is having now. Chaplain attentively listened, provided comfort, and talked with RN after the encounter and conveyed the pt's needs. Chaplain will be available if needed.   M.Kubra Delano Metz Resident [phone]

## 2023-03-19 NOTE — Progress Notes (Signed)
 [facility] PROGRESS NOTE    Progress Note  Shelby Wiley  ZOX:[id number] DOB: [birth year] DOA: 03/18/2023 PCP: Clayborn Heron, MD     Brief Narrative:   Shelby Wiley is an 88 y.o. female past medical history of chronic leg wounds, mild lymphedema comes in for left lower extremity cellulitis.  One of her neighbors called EMS as they have not seen her in 4 days, EMS found to sitting in the toilet.  Assessment/Plan:   Cellulitis of left lower extremity: Started empirically on IV vancomycin and Rocephin She has defervesced leukocytosis improved. Blood cultures have remained negative till date. Wound care has been consulted. She is awake today but she still feels tired but a little bit better than yesterday.  Acute kidney injury: Her creatinine remains elevated. Her blood pressure is borderline. I will go ahead and start her on oral midodrine.  Hypokalemia: Change her fluids to normal saline with potassium.  Normocytic anemia: She relates no signs of overt bleeding. Hemoglobin was 14 on admission now was 10.8 she was obviously severely hemoconcentrated. Plan about 3 L of fluid. Baseline hemoglobin is like around 11.  Bilateral lymphedema: Consult wound care  Paroxysmal A-fib (HCC) Rate controlled, continue Eliquis. RVR is improved.  OBESITY, MORBID Noted  Sacral decubitus ulcer stage I present on admission: RN Pressure Injury Documentation: Pressure Injury 03/18/23 Buttocks Right Deep Tissue Pressure Injury - Purple or maroon localized area of discolored intact skin or blood-filled blister due to damage of underlying soft tissue from pressure and/or shear. black, closed (Active)  03/18/23 1800  Location: Buttocks  Location Orientation: Right  Staging: Deep Tissue Pressure Injury - Purple or maroon localized area of discolored intact skin or blood-filled blister due to damage of underlying soft tissue from pressure and/or shear.  Wound Description  (Comments): black, closed  Present on Admission: Yes  Dressing Type Foam - Lift dressing to assess site every shift 03/18/23 2000     DVT prophylaxis: Eliquis Family Communication:none Status is: Inpatient Remains inpatient appropriate because: Right lower extremity cellulitis    Code Status:     Code Status Orders  (From admission, onward)           Start     Ordered   03/18/23 1240  Full code  Continuous       Question:  By:  Answer:  Consent: discussion documented in EHR   03/18/23 1240           Code Status History     Date Active Date Inactive Code Status Order ID Comments User Context   09/28/2022 2251 10/03/2022 1824 Full Code [id number]  Therisa Doyne, MD ED   04/17/2011 1129 04/20/2011 1804 Full Code [id number]  Ben[place], Toula Moos, RN Inpatient         IV Access:   Peripheral IV   Procedures and diagnostic studies:   DG Foot 2 Views Left Result Date: 03/18/2023 CLINICAL DATA:  Foot ulcer.  Assess for possible osteomyelitis. EXAM: LEFT FOOT - 2 VIEW COMPARISON:  09/28/2022. FINDINGS: Ulcer location not definitive on the radiographs. On the lateral view, there is an apparent fracture of the distal aspect of the proximal phalanx of what is likely the fifth toe. No other evidence of a fracture. No definite bone resorption. Joints are normally aligned. There is soft tissue swelling most prominent along the forefoot. No soft tissue air. Calcification is noted along the Achilles tendon and plantar fascia. IMPRESSION: 1. Apparent fracture of the distal aspect of the  proximal phalanx of what is likely the fifth toe, which could be the result of osteomyelitis, although this is not well-defined on this exam. There is no other evidence to suggest osteomyelitis. 2. Soft tissue swelling. No soft tissue air. Electronically Signed   By: Amie Portland M.D.   On: 03/18/2023 11:27   DG Chest Port 1 View Result Date: 03/18/2023 CLINICAL DATA:  Possible sepsis. EXAM:  PORTABLE CHEST 1 VIEW COMPARISON:  04/06/2011. FINDINGS: Cardiac silhouette is normal in size. No mediastinal or hilar masses. Mild chronic peripheral interstitial thickening. Lungs hyperexpanded but otherwise clear. No convincing pleural effusion.  No pneumothorax. Skeletal structures are grossly intact. IMPRESSION: 1. No no acute cardiopulmonary disease. Electronically Signed   By: Amie Portland M.D.   On: 03/18/2023 11:23     Medical Consultants:   None.   Subjective:    Shelby Wiley she is awake this morning relates she feels slightly better than yesterday  Objective:    Vitals:   03/18/23 2005 03/18/23 2333 03/19/23 0511 03/19/23 0518  BP: (!) 85/51 (!) 84/45 (!) 79/42 (!) 84/43  Pulse: 94  (!) 111   Resp: (!) 22 18 (!) 23 20  Temp: 97.8 F (36.6 C) 97.6 F (36.4 C) 98.4 F (36.9 C)   TempSrc: Oral Oral Oral   SpO2: 97% 98% 95%    SpO2: 95 %   Intake/Output Summary (Last 24 hours) at 03/19/2023 1610 Last data filed at 03/18/2023 2257 Gross per 24 hour  Intake 120 ml  Output 230 ml  Net -110 ml   There were no vitals filed for this visit.  Exam: General exam: In no acute distress. Respiratory system: Good air movement and clear to auscultation. Cardiovascular system: S1 & S2 heard, RRR. No JVD. Gastrointestinal system: Abdomen is nondistended, soft and nontender.  Extremities: Persistent erythema Skin: No rashes, lesions or ulcers Psychiatry: Judgement and insight appear normal. Mood & affect appropriate.    Data Reviewed:    Labs: Basic Metabolic Panel: Recent Labs  Lab 03/18/23 1030 03/19/23 0344  NA 139 137  K 3.4* 3.1*  CL 102 106  CO2 21* 23  GLUCOSE 94 131*  BUN 30* 30*  CREATININE 1.05* 1.11*  CALCIUM 8.7* 7.5*   GFR CrCl cannot be calculated (Unknown ideal weight.). Liver Function Tests: Recent Labs  Lab 03/18/23 1030 03/19/23 0344  AST 69* 38  ALT 38 27  ALKPHOS 49 36*  BILITOT 1.2 0.7  PROT 5.8* 4.2*  ALBUMIN 2.4* 1.7*    No results for input(s): "LIPASE", "AMYLASE" in the last 168 hours. No results for input(s): "AMMONIA" in the last 168 hours. Coagulation profile No results for input(s): "INR", "PROTIME" in the last 168 hours. COVID-19 Labs  No results for input(s): "DDIMER", "FERRITIN", "LDH", "CRP" in the last 72 hours.  Lab Results  Component Value Date   SARSCOV2NAA NEGATIVE 03/18/2023    CBC: Recent Labs  Lab 03/18/23 1030 03/19/23 0344  WBC 12.1* 9.7  NEUTROABS 10.1*  --   HGB 14.8 10.8*  HCT 43.6 32.5*  MCV 97.3 97.3  PLT 212 185   Cardiac Enzymes: Recent Labs  Lab 03/18/23 1030  CKTOTAL 674*   BNP (last 3 results) No results for input(s): "PROBNP" in the last 8760 hours. CBG: No results for input(s): "GLUCAP" in the last 168 hours. D-Dimer: No results for input(s): "DDIMER" in the last 72 hours. Hgb A1c: No results for input(s): "HGBA1C" in the last 72 hours. Lipid Profile: No results for  input(s): "CHOL", "HDL", "LDLCALC", "TRIG", "CHOLHDL", "LDLDIRECT" in the last 72 hours. Thyroid function studies: No results for input(s): "TSH", "T4TOTAL", "T3FREE", "THYROIDAB" in the last 72 hours.  Invalid input(s): "FREET3" Anemia work up: No results for input(s): "VITAMINB12", "FOLATE", "FERRITIN", "TIBC", "IRON", "RETICCTPCT" in the last 72 hours. Sepsis Labs: Recent Labs  Lab 03/18/23 1030 03/18/23 1044 03/18/23 1301 03/19/23 0344  WBC 12.1*  --   --  9.7  LATICACIDVEN  --  2.1* 1.7  --    Microbiology Recent Results (from the past 240 hours)  Resp panel by RT-PCR (RSV, Flu A&B, Covid) Anterior Nasal Swab     Status: None   Collection Time: 03/18/23 10:16 AM   Specimen: Anterior Nasal Swab  Result Value Ref Range Status   SARS Coronavirus 2 by RT PCR NEGATIVE NEGATIVE Final   Influenza A by PCR NEGATIVE NEGATIVE Final   Influenza B by PCR NEGATIVE NEGATIVE Final    Comment: (NOTE) The Xpert Xpress SARS-CoV-2/FLU/RSV plus assay is intended as an aid in the  diagnosis of influenza from Nasopharyngeal swab specimens and should not be used as a sole basis for treatment. Nasal washings and aspirates are unacceptable for Xpert Xpress SARS-CoV-2/FLU/RSV testing.  Fact Sheet for Patients: BloggerCourse.com  Fact Sheet for Healthcare Providers: SeriousBroker.it  This test is not yet approved or cleared by the [place] FDA and has been authorized for detection and/or diagnosis of SARS-CoV-2 by FDA under an Emergency Use Authorization (EUA). This EUA will remain in effect (meaning this test can be used) for the duration of the COVID-19 declaration under Section 564(b)(1) of the Act, 21 U.S.C. section 360bbb-3(b)(1), unless the authorization is terminated or revoked.     Resp Syncytial Virus by PCR NEGATIVE NEGATIVE Final    Comment: (NOTE) Fact Sheet for Patients: BloggerCourse.com  Fact Sheet for Healthcare Providers: SeriousBroker.it  This test is not yet approved or cleared by the [place] FDA and has been authorized for detection and/or diagnosis of SARS-CoV-2 by FDA under an Emergency Use Authorization (EUA). This EUA will remain in effect (meaning this test can be used) for the duration of the COVID-19 declaration under Section 564(b)(1) of the Act, 21 U.S.C. section 360bbb-3(b)(1), unless the authorization is terminated or revoked.  Performed at [facility], [address] [place], [place] [address]   Blood Culture (routine x 2)     Status: None (Preliminary result)   Collection Time: 03/18/23 10:16 AM   Specimen: BLOOD  Result Value Ref Range Status   Specimen Description BLOOD LEFT ANTECUBITAL  Final   Special Requests   Final    BOTTLES DRAWN AEROBIC AND ANAEROBIC Blood Culture results may not be optimal due to an inadequate volume of blood received in culture bottles   Culture  Setup Time   Final    GRAM  NEGATIVE RODS AEROBIC BOTTLE ONLY CRITICAL RESULT CALLED TO, READ BACK BY AND VERIFIED WITH: V BRYK,PHARMD@[id number]  03/19/23 MK Performed at [facility], [address] [place], [place] [address]    Culture GRAM NEGATIVE RODS  Final   Report Status PENDING  Incomplete  Blood Culture ID Panel (Reflexed)     Status: Abnormal   Collection Time: 03/18/23 10:16 AM  Result Value Ref Range Status   Enterococcus faecalis NOT DETECTED NOT DETECTED Final   Enterococcus Faecium NOT DETECTED NOT DETECTED Final   Listeria monocytogenes NOT DETECTED NOT DETECTED Final   Staphylococcus species NOT DETECTED NOT DETECTED Final   Staphylococcus  aureus (BCID) NOT DETECTED NOT DETECTED Final   Staphylococcus epidermidis NOT DETECTED NOT DETECTED Final   Staphylococcus lugdunensis NOT DETECTED NOT DETECTED Final   Streptococcus species NOT DETECTED NOT DETECTED Final   Streptococcus agalactiae NOT DETECTED NOT DETECTED Final   Streptococcus pneumoniae NOT DETECTED NOT DETECTED Final   Streptococcus pyogenes NOT DETECTED NOT DETECTED Final   A.calcoaceticus-baumannii NOT DETECTED NOT DETECTED Final   Bacteroides fragilis NOT DETECTED NOT DETECTED Final   Enterobacterales DETECTED (A) NOT DETECTED Final    Comment: Enterobacterales represent a large order of gram negative bacteria, not a single organism. CRITICAL RESULT CALLED TO, READ BACK BY AND VERIFIED WITH: V BRYK,PHARMD@0410  03/19/23 MK    Enterobacter cloacae complex NOT DETECTED NOT DETECTED Final   Escherichia coli NOT DETECTED NOT DETECTED Final   Klebsiella aerogenes NOT DETECTED NOT DETECTED Final   Klebsiella oxytoca NOT DETECTED NOT DETECTED Final   Klebsiella pneumoniae NOT DETECTED NOT DETECTED Final   Proteus species DETECTED (A) NOT DETECTED Final    Comment: CRITICAL RESULT CALLED TO, READ BACK BY AND VERIFIED WITH: V BRYK,PHARMD@0410  03/19/23 MK    Salmonella species NOT DETECTED NOT DETECTED Final   Serratia marcescens NOT  DETECTED NOT DETECTED Final   Haemophilus influenzae NOT DETECTED NOT DETECTED Final   Neisseria meningitidis NOT DETECTED NOT DETECTED Final   Pseudomonas aeruginosa NOT DETECTED NOT DETECTED Final   Stenotrophomonas maltophilia NOT DETECTED NOT DETECTED Final   Candida albicans NOT DETECTED NOT DETECTED Final   Candida auris NOT DETECTED NOT DETECTED Final   Candida glabrata NOT DETECTED NOT DETECTED Final   Candida krusei NOT DETECTED NOT DETECTED Final   Candida parapsilosis NOT DETECTED NOT DETECTED Final   Candida tropicalis NOT DETECTED NOT DETECTED Final   Cryptococcus neoformans/gattii NOT DETECTED NOT DETECTED Final   CTX-M ESBL NOT DETECTED NOT DETECTED Final   Carbapenem resistance IMP NOT DETECTED NOT DETECTED Final   Carbapenem resistance KPC NOT DETECTED NOT DETECTED Final   Carbapenem resistance NDM NOT DETECTED NOT DETECTED Final   Carbapenem resist OXA 48 LIKE NOT DETECTED NOT DETECTED Final   Carbapenem resistance VIM NOT DETECTED NOT DETECTED Final    Comment: Performed at [facility], [address], [place], [place] [address]  Blood Culture (routine x 2)     Status: None (Preliminary result)   Collection Time: 03/18/23 10:21 AM   Specimen: BLOOD RIGHT HAND  Result Value Ref Range Status   Specimen Description BLOOD RIGHT HAND  Final   Special Requests   Final    AEROBIC BOTTLE ONLY Blood Culture results may not be optimal due to an inadequate volume of blood received in culture bottles   Culture   Final    NO GROWTH < 24 HOURS Performed at [facility], [address] [place], [place] [address]    Report Status PENDING  Incomplete     Medications:    apixaban  5 mg Oral BID   carvedilol  3.125 mg Oral BID WC   feeding supplement  237 mL Oral BID BM   Continuous Infusions:  sodium chloride 75 mL/hr at 03/19/23 0030   cefTRIAXone (ROCEPHIN)  IV     vancomycin        LOS: 1 day   Marinda Elk  [facility]  03/19/2023,  6:46 AM

## 2023-03-19 NOTE — Evaluation (Signed)
 Occupational Therapy Evaluation Patient Details Name: Shelby Wiley MRN: [id number] DOB: 02/13/1935 Today's Date: 03/19/2023   History of Present Illness   88 y.o. female comes in 03/18/23 for generalized weakness and confusion. EMS report she was last seen by an individual about 4 days PTA and neighbor called EMS. Found covered in stool and urine. +RLE cellulitis, AKI, hypokalemia, afib with RVR  PMH chronic leg wounds, mild lymphedema, essential hypertension     Clinical Impressions Pt from home alone (with cat?). Reports being ambulatory with cane and doing her own ADL/IADL (friend does her grocery shopping and takes her to appointments). Pt uses a shower chair for bathing, and sleeping in liftchair/recliner. However, Pt confused throughout session and increasingly agitated no matter what therapy team did, no number in chart to contact and find out what baseline cognition is. Pt perseverating on "doctor from yesterday at a different hospital" being very attentive and saying "you know I don't remember anything right?" Therapists provided support, active listening, reassuring with facts when able - however Pt unable to be directed by the end of session. Did not want therapists to leave - but unwilling to do any more therapy at this time. Pt is currently max A to total A for all aspects of ADL. Total A +2 for all aspects of mobility or bed level care. Obesity, and confusion hindering patient currently. suspect that she is capable of more. OT will continue to follow acutely and recommending rehab of <3 hours daily - assuming that her cognition will improve and she is more willing to participate in therapy sessions, if not will need to consider LTC with no therapy follow up     If plan is discharge home, recommend the following:   Two people to help with walking and/or transfers;Two people to help with bathing/dressing/bathroom;Assistance with cooking/housework;Assistance with feeding;Direct  supervision/assist for medications management;Direct supervision/assist for financial management;Assist for transportation;Help with stairs or ramp for entrance;Supervision due to cognitive status     Functional Status Assessment   Patient has had a recent decline in their functional status and/or demonstrates limited ability to make significant improvements in function in a reasonable and predictable amount of time     Equipment Recommendations   Other (comment) (defer to next venue)     Recommendations for Other Services   PT consult;Speech consult (cognition)     Precautions/Restrictions   Precautions Precautions: Fall Recall of Precautions/Restrictions: Impaired Restrictions Weight Bearing Restrictions Per Provider Order: No     Mobility Bed Mobility Overal bed mobility: Needs Assistance             General bed mobility comments: initiated supine to sit with moving legs toward EOB with +2 total assist, pt became more upset and refused attempts to roll or elevated HOB more than 30 degrees; will be at least +2 total assist for mobility - she shared that she typically sleeps in her lift chair recliner at home    Transfers                   General transfer comment: NT this session      Balance                                           ADL either performed or assessed with clinical judgement   ADL Overall ADL's : Needs assistance/impaired Eating/Feeding: Maximal assistance  Grooming: Maximal assistance   Upper Body Bathing: Maximal assistance;Bed level   Lower Body Bathing: Total assistance;Bed level   Upper Body Dressing : Total assistance;Bed level   Lower Body Dressing: Total assistance;Bed level   Toilet Transfer: Total assistance;+2 for physical assistance;+2 for safety/equipment (bed level)   Toileting- Clothing Manipulation and Hygiene: Total assistance;+2 for safety/equipment;+2 for physical assistance;Bed level        Functional mobility during ADLs: Total assistance;+2 for physical assistance;+2 for safety/equipment (lift only at this point) General ADL Comments: at this time (largely due to cooperation) Pt max or total A for all aspects of ADL. She wanted to attempt to eat with HOB <30 degrees, she did demonstrate ROM at L elbow, but very limited willing arm movement to demonstrate ADL, no active motion against gravity for BLE - obesity, and confusion hindering patient currently. suspect that she is capable of more - but currently max/total A     Vision Ability to See in Adequate Light: 0 Adequate Additional Comments: not assessed today     Perception         Praxis         Pertinent Vitals/Pain Pain Assessment Pain Assessment: Faces Faces Pain Scale: Hurts even more Pain Location: bil LEs Pain Descriptors / Indicators: Discomfort, Guarding Pain Intervention(s): Limited activity within patient's tolerance, Monitored during session, Repositioned     Extremity/Trunk Assessment Upper Extremity Assessment Upper Extremity Assessment: Generalized weakness (would not participate in MMT)   Lower Extremity Assessment Lower Extremity Assessment: Defer to PT evaluation RLE Deficits / Details: no active movement demonstrated; passively resists LLE Deficits / Details: no active movement demonstrated; passively resists   Cervical / Trunk Assessment Cervical / Trunk Assessment: Other exceptions Cervical / Trunk Exceptions: overweight, cervical potentially with head turn L   Communication Communication Communication: No apparent difficulties (verbose)   Cognition Arousal: Alert Behavior During Therapy: Anxious (easily irritated) Cognition: No family/caregiver present to determine baseline, Cognition impaired   Orientation impairments: Situation Awareness: Online awareness impaired, Intellectual awareness intact Memory impairment (select all impairments): Short-term memory, Working  memory Attention impairment (select first level of impairment): Sustained attention Executive functioning impairment (select all impairments): Initiation, Sequencing, Reasoning, Problem solving OT - Cognition Comments: Pt perseverating on seeing the doctor that she saw "at the other hospital" largely uncooperative with any task requested after initially agreeing. generally confused and cycling into irritation and unable to re-direct despite tactic                 Following commands:  (chooses not to)       Cueing  General Comments   Cueing Techniques: Verbal cues;Gestural cues;Tactile cues  VSS per monitor   Exercises     Shoulder Instructions      Home Living Family/patient expects to be discharged to:: Private residence Living Arrangements: Alone Available Help at Discharge: Friend(s);Available PRN/intermittently Type of Home: House Home Access: Stairs to enter [employer] of Steps: 2 Entrance Stairs-Rails: Left Home Layout: One level     Bathroom Shower/Tub: Producer, television/film/video: Handicapped height     Home Equipment: Shower seat;Toilet riser;Cane - single point;Rolling Walker (2 wheels);Hand held shower head   Additional Comments: Pt unreliable historian      Prior Functioning/Environment Prior Level of Function : Independent/Modified Independent;History of Falls (last six months);Driving             Mobility Comments: walks with SPC or RW ADLs Comments: friend brings groceries;    OT  Problem List: Decreased strength;Decreased activity tolerance;Decreased cognition;Decreased safety awareness;Obesity;Increased edema   OT Treatment/Interventions: Self-care/ADL training;Therapeutic exercise;DME and/or AE instruction;Therapeutic activities;Cognitive remediation/compensation;Patient/family education;Balance training      OT Goals(Current goals can be found in the care plan section)   Acute Rehab OT Goals Patient Stated Goal:  n/a OT Goal Formulation: Patient unable to participate in goal setting Time For Goal Achievement: 04/02/23 Potential to Achieve Goals: Fair ADL Goals Pt Will Perform Grooming: with set-up;sitting Pt Will Perform Upper Body Dressing: with mod assist;sitting Pt Will Transfer to Toilet: with max assist;with +2 assist;squat pivot transfer;bedside commode Pt Will Perform Toileting - Clothing Manipulation and hygiene: with max assist;sit to/from stand Additional ADL Goal #1: Pt will perform bed mobility at mod A +2 as precursor to participation in ADL   OT Frequency:  Min 2X/week    Co-evaluation PT/OT/SLP Co-Evaluation/Treatment: Yes Reason for Co-Treatment: Complexity of the patient's impairments (multi-system involvement);Necessary to address cognition/behavior during functional activity;For patient/therapist safety;To address functional/ADL transfers PT goals addressed during session: Mobility/safety with mobility;Strengthening/ROM OT goals addressed during session: ADL's and self-care;Strengthening/ROM      AM-PAC OT "6 Clicks" Daily Activity     Outcome Measure Help from another person eating meals?: A Lot Help from another person taking care of personal grooming?: A Lot Help from another person toileting, which includes using toliet, bedpan, or urinal?: Total Help from another person bathing (including washing, rinsing, drying)?: Total Help from another person to put on and taking off regular upper body clothing?: Total Help from another person to put on and taking off regular lower body clothing?: Total 6 Click Score: 8   End of Session Nurse Communication: Mobility status;Need for lift equipment;Precautions  Activity Tolerance: Treatment limited secondary to agitation Patient left: in bed;with call bell/phone within reach;with bed alarm set  OT Visit Diagnosis: History of falling (Z91.81);Muscle weakness (generalized) (M62.81);Other symptoms and signs involving cognitive  function                Time: 4098-1191 OT Time Calculation (min): 31 min Charges:  OT General Charges $OT Visit: 1 Visit OT Evaluation $OT Eval Moderate Complexity: 1 Mod Nyoka Cowden OTR/L Acute Rehabilitation Services Office: [phone]   Emelda Fear 03/19/2023, 1:19 PM

## 2023-03-19 NOTE — Evaluation (Signed)
 Physical Therapy Evaluation Patient Details Name: Shelby Wiley MRN: [id number] DOB: 1935-07-06 Today's Date: 03/19/2023  History of Present Illness  88 y.o. female comes in 03/18/23 for generalized weakness and confusion. EMS report she was last seen by an individual about 4 days PTA and neighbor called EMS. Found covered in stool and urine. +RLE cellulitis, AKI, hypokalemia, afib with RVR  PMH chronic leg wounds, mild lymphedema, essential hypertension  Clinical Impression   Pt admitted secondary to problem above with deficits below. PTA patient was living alone and walking with a cane (per pt report; ?accuracy due to confusion). Pt currently requires +2 total assist for bed mobility with pt refusing to come to sit EOB after initially agreeing. Patient easily irritated, frustrated as therapies tried to explain importance of activity and stated, "this is just not going to work. I just need to go home." Clearly poor insight into her deficits and abilities.  Anticipate patient may benefit from PT to address problems listed below and will institute trial of PT. Will continue to follow acutely to maximize functional mobility independence and safety. Patient will benefit from continued inpatient follow up therapy, <3 hours/day          If plan is discharge home, recommend the following: Two people to help with walking and/or transfers;Two people to help with bathing/dressing/bathroom;Direct supervision/assist for medications management;Direct supervision/assist for financial management;Supervision due to cognitive status   Can travel by private vehicle   No    Equipment Recommendations Wheelchair (measurements PT);Wheelchair cushion (measurements PT);Hospital bed;Hoyer lift  Recommendations for Other Services       Functional Status Assessment Patient has had a recent decline in their functional status and/or demonstrates limited ability to make significant improvements in function in a  reasonable and predictable amount of time     Precautions / Restrictions Precautions Precautions: Fall Recall of Precautions/Restrictions: Impaired      Mobility  Bed Mobility Overal bed mobility: Needs Assistance             General bed mobility comments: initiated supine to sit with moving legs toward EOB with +2 total assist, pt became more upset and refused attempts to roll or elevated HOB more than 30 degrees; will be at least +2 total assist for mobility    Transfers                        Ambulation/Gait                  Stairs            Wheelchair Mobility     Tilt Bed    Modified Rankin (Stroke Patients Only)       Balance                                             Pertinent Vitals/Pain Pain Assessment Pain Assessment: Faces Faces Pain Scale: Hurts even more Pain Location: bil LEs Pain Descriptors / Indicators: Discomfort, Guarding Pain Intervention(s): Limited activity within patient's tolerance, Monitored during session, Repositioned    Home Living Family/patient expects to be discharged to:: Private residence Living Arrangements: Alone Available Help at Discharge: Friend(s);Available PRN/intermittently Type of Home: House Home Access: Stairs to enter Entrance Stairs-Rails: Left Entrance Stairs-Number of Steps: 2   Home Layout: One level Home Equipment: Shower seat;Toilet riser;Cane - single point;Rolling  Walker (2 wheels);Hand held shower head      Prior Function Prior Level of Function : Independent/Modified Independent;History of Falls (last six months);Driving             Mobility Comments: walks with SPC or RW ADLs Comments: friend brings groceries;     Extremity/Trunk Assessment   Upper Extremity Assessment Upper Extremity Assessment: Defer to OT evaluation    Lower Extremity Assessment Lower Extremity Assessment: RLE deficits/detail;LLE deficits/detail (bil LE edema  (?lymphedema) with redness) RLE Deficits / Details: no active movement demonstrated; passively resists LLE Deficits / Details: no active movement demonstrated; passively resists    Cervical / Trunk Assessment Cervical / Trunk Assessment: Other exceptions Cervical / Trunk Exceptions: overweight  Communication   Communication Communication: No apparent difficulties (verbose)    Cognition Arousal: Alert Behavior During Therapy: Anxious (easily irritated)   PT - Cognitive impairments: No family/caregiver present to determine baseline, Memory, Awareness, Problem solving, Safety/Judgement                       PT - Cognition Comments: pt becoming irritated with therapists attempts to help her move despite education re: inactivity; pt perseverating on being cold and not knowing why she was brought to this hospital; comments she just needs to go home (although clearly cannot do this) Following commands:  (chooses not to)       Cueing Cueing Techniques: Verbal cues, Gestural cues, Tactile cues     General Comments General comments (skin integrity, edema, etc.): VSS per monitor    Exercises     Assessment/Plan    PT Assessment Patient needs continued PT services  PT Problem List Decreased strength;Decreased range of motion;Decreased activity tolerance;Decreased balance;Decreased mobility;Decreased cognition;Decreased knowledge of use of DME;Decreased safety awareness;Decreased knowledge of precautions;Obesity;Decreased skin integrity;Pain       PT Treatment Interventions DME instruction;Gait training;Functional mobility training;Therapeutic activities;Therapeutic exercise;Balance training;Cognitive remediation;Patient/family education    PT Goals (Current goals can be found in the Care Plan section)  Acute Rehab PT Goals Patient Stated Goal: wants to go home PT Goal Formulation: Patient unable to participate in goal setting Time For Goal Achievement: 04/02/23 Potential to  Achieve Goals: Fair    Frequency Min 1X/week     Co-evaluation PT/OT/SLP Co-Evaluation/Treatment: Yes Reason for Co-Treatment: Complexity of the patient's impairments (multi-system involvement);Necessary to address cognition/behavior during functional activity;For patient/therapist safety;To address functional/ADL transfers PT goals addressed during session: Mobility/safety with mobility;Strengthening/ROM         AM-PAC PT "6 Clicks" Mobility  Outcome Measure Help needed turning from your back to your side while in a flat bed without using bedrails?: Total Help needed moving from lying on your back to sitting on the side of a flat bed without using bedrails?: Total Help needed moving to and from a bed to a chair (including a wheelchair)?: Total Help needed standing up from a chair using your arms (e.g., wheelchair or bedside chair)?: Total Help needed to walk in hospital room?: Total Help needed climbing 3-5 steps with a railing? : Total 6 Click Score: 6    End of Session   Activity Tolerance: Patient limited by pain;Other (comment) (confusion) Patient left: in bed;with call bell/phone within reach;with bed alarm set Nurse Communication: Mobility status PT Visit Diagnosis: Muscle weakness (generalized) (M62.81);Difficulty in walking, not elsewhere classified (R26.2);Other symptoms and signs involving the nervous system (R29.898)    Time: 1324-4010 PT Time Calculation (min) (ACUTE ONLY): 30 min   Charges:   PT  Evaluation $PT Eval Moderate Complexity: 1 Mod   PT General Charges $$ ACUTE PT VISIT: 1 Visit          Jerolyn Center, PT Acute Rehabilitation Services  Office [phone]   Zena Amos 03/19/2023, 10:38 AM

## 2023-03-19 NOTE — Progress Notes (Signed)
 PHARMACY - PHYSICIAN COMMUNICATION CRITICAL VALUE ALERT - BLOOD CULTURE IDENTIFICATION (BCID)  Shelby Wiley is an 88 y.o. female who presented to [facility] on 03/18/2023 with a chief complaint of AMS after being found on the toilet during a welfare check, possibly in same spot for up to five days.  Assessment:  Started on ABX for wounds and sepsis, now growing Proteus spp in 1 of 3 blood cx bottles.  Name of physician (or Provider) ContactedGardiner Ramus MD  Current antibiotics: ceftriaxone and vancomycin  Changes to prescribed antibiotics recommended:  Patient is on recommended antibiotics -- could consider d/c'ing vanc when other sources ruled out.  Results for orders placed or performed during the hospital encounter of 03/18/23  Blood Culture ID Panel (Reflexed) (Collected: 03/18/2023 10:16 AM)  Result Value Ref Range   Enterococcus faecalis NOT DETECTED NOT DETECTED   Enterococcus Faecium NOT DETECTED NOT DETECTED   Listeria monocytogenes NOT DETECTED NOT DETECTED   Staphylococcus species NOT DETECTED NOT DETECTED   Staphylococcus aureus (BCID) NOT DETECTED NOT DETECTED   Staphylococcus epidermidis NOT DETECTED NOT DETECTED   Staphylococcus lugdunensis NOT DETECTED NOT DETECTED   Streptococcus species NOT DETECTED NOT DETECTED   Streptococcus agalactiae NOT DETECTED NOT DETECTED   Streptococcus pneumoniae NOT DETECTED NOT DETECTED   Streptococcus pyogenes NOT DETECTED NOT DETECTED   A.calcoaceticus-baumannii NOT DETECTED NOT DETECTED   Bacteroides fragilis NOT DETECTED NOT DETECTED   Enterobacterales DETECTED (A) NOT DETECTED   Enterobacter cloacae complex NOT DETECTED NOT DETECTED   Escherichia coli NOT DETECTED NOT DETECTED   Klebsiella aerogenes NOT DETECTED NOT DETECTED   Klebsiella oxytoca NOT DETECTED NOT DETECTED   Klebsiella pneumoniae NOT DETECTED NOT DETECTED   Proteus species DETECTED (A) NOT DETECTED   Salmonella species NOT DETECTED NOT DETECTED   Serratia  marcescens NOT DETECTED NOT DETECTED   Haemophilus influenzae NOT DETECTED NOT DETECTED   Neisseria meningitidis NOT DETECTED NOT DETECTED   Pseudomonas aeruginosa NOT DETECTED NOT DETECTED   Stenotrophomonas maltophilia NOT DETECTED NOT DETECTED   Candida albicans NOT DETECTED NOT DETECTED   Candida auris NOT DETECTED NOT DETECTED   Candida glabrata NOT DETECTED NOT DETECTED   Candida krusei NOT DETECTED NOT DETECTED   Candida parapsilosis NOT DETECTED NOT DETECTED   Candida tropicalis NOT DETECTED NOT DETECTED   Cryptococcus neoformans/gattii NOT DETECTED NOT DETECTED   CTX-M ESBL NOT DETECTED NOT DETECTED   Carbapenem resistance IMP NOT DETECTED NOT DETECTED   Carbapenem resistance KPC NOT DETECTED NOT DETECTED   Carbapenem resistance NDM NOT DETECTED NOT DETECTED   Carbapenem resist OXA 48 LIKE NOT DETECTED NOT DETECTED   Carbapenem resistance VIM NOT DETECTED NOT DETECTED    Vernard Gambles, PharmD, BCPS  03/19/2023  4:41 AM

## 2023-03-20 DIAGNOSIS — L03116 Cellulitis of left lower limb: Secondary | ICD-10-CM | POA: Diagnosis not present

## 2023-03-20 DIAGNOSIS — R627 Adult failure to thrive: Secondary | ICD-10-CM | POA: Diagnosis not present

## 2023-03-20 DIAGNOSIS — N179 Acute kidney failure, unspecified: Secondary | ICD-10-CM | POA: Diagnosis not present

## 2023-03-20 DIAGNOSIS — L89302 Pressure ulcer of unspecified buttock, stage 2: Secondary | ICD-10-CM | POA: Diagnosis not present

## 2023-03-20 LAB — BASIC METABOLIC PANEL
Anion gap: 9 (ref 5–15)
BUN: 29 mg/dL — ABNORMAL HIGH (ref 8–23)
CO2: 25 mmol/L (ref 22–32)
Calcium: 7.7 mg/dL — ABNORMAL LOW (ref 8.9–10.3)
Chloride: 106 mmol/L (ref 98–111)
Creatinine, Ser: 0.75 mg/dL (ref 0.44–1.00)
GFR, Estimated: >60 mL/min (ref >60)
Glucose, Bld: 104 mg/dL — ABNORMAL HIGH (ref 70–99)
Potassium: 4.4 mmol/L (ref 3.5–5.1)
Sodium: 140 mmol/L (ref 135–145)

## 2023-03-20 MED ORDER — SODIUM CHLORIDE 0.9 % IV SOLN: INTRAVENOUS | Status: AC

## 2023-03-20 NOTE — Progress Notes (Signed)
 [facility] PROGRESS NOTE    Progress Note  JOYCIE AERTS  MWU:[id number] DOB: [birth year] DOA: 03/18/2023 PCP: Clayborn Heron, MD    Brief Narrative:   Shelby Wiley is an 88 y.o. female past medical history of chronic leg wounds, mild lymphedema comes in for left lower extremity cellulitis.  One of her neighbors called EMS as they have not seen her in 4 days, EMS found to sitting in the toilet.  Assessment/Plan:  Cellulitis of left lower extremity: Started empirically on IV vancomycin and Rocephin. Continue to hold Coreg. She has defervesced leukocytosis improved. Blood cultures have remained negative till date. Wound care has been consulted. She is awake today but she still feels tired but a little bit better than yesterday.  Acute kidney injury: Her creatinine remains elevated. Her blood pressure is borderline. Continue IV fluids started on midodrine yesterday.  Hypotension: At home supposedly she is on Coreg this has been discontinued. Her blood pressure was hard to sustain only on IV fluids. Some midodrine was added.  Lactate cleared less than 12 hours of IV fluids. Unclear etiology  Hypokalemia: Potassium was repleted orally. Basic metabolic panels pending this morning.  Possible fracture of the distal aspect of the proximal phalanges of the fifth toe: Conservative management.   No evidence of osteomyelitis.  Normocytic anemia: She relates no signs of overt bleeding. Hemoglobin was 14 on admission now was 10.8 she was obviously severely hemoconcentrated. Plan about 3 L of fluid. Baseline hemoglobin is like around 11.  Bilateral lymphedema: Consult wound care  Paroxysmal A-fib (HCC) Rate controlled, continue Eliquis. RVR is improved.  OBESITY, MORBID Noted  Sacral decubitus ulcer stage II present on admission: RN Pressure Injury Documentation: Pressure Injury 03/18/23 Buttocks Right Deep Tissue Pressure Injury - Purple or maroon localized  area of discolored intact skin or blood-filled blister due to damage of underlying soft tissue from pressure and/or shear. black, closed (Active)  03/18/23 1800  Location: Buttocks  Location Orientation: Right  Staging: Deep Tissue Pressure Injury - Purple or maroon localized area of discolored intact skin or blood-filled blister due to damage of underlying soft tissue from pressure and/or shear.  Wound Description (Comments): black, closed  Present on Admission: Yes  Dressing Type Foam - Lift dressing to assess site every shift 03/20/23 0400     Pressure Injury 03/19/23 Heel Left;Medial Stage 3 -  Full thickness tissue loss. Subcutaneous fat may be visible but bone, tendon or muscle are NOT exposed. open wound with white slough with drainage (Active)  03/19/23 2200  Location: Heel  Location Orientation: Left;Medial  Staging: Stage 3 -  Full thickness tissue loss. Subcutaneous fat may be visible but bone, tendon or muscle are NOT exposed.  Wound Description (Comments): open wound with white slough with drainage  Present on Admission: Yes  Dressing Type Foam - Lift dressing to assess site every shift 03/20/23 0100     Pressure Injury 03/19/23 Vertebral column Left;Upper Stage 2 -  Partial thickness loss of dermis presenting as a shallow open injury with a red, pink wound bed without slough. (Active)  03/19/23 2200  Location: Vertebral column  Location Orientation: Left;Upper  Staging: Stage 2 -  Partial thickness loss of dermis presenting as a shallow open injury with a red, pink wound bed without slough.  Wound Description (Comments):   Present on Admission: Yes  Dressing Type Foam - Lift dressing to assess site every shift 03/20/23 0100     Pressure Injury 03/19/23 Buttocks Left  Stage 2 -  Partial thickness loss of dermis presenting as a shallow open injury with a red, pink wound bed without slough. with DTI (Active)  03/19/23 2200  Location: Buttocks  Location Orientation: Left   Staging: Stage 2 -  Partial thickness loss of dermis presenting as a shallow open injury with a red, pink wound bed without slough.  Wound Description (Comments): with DTI  Present on Admission: Yes  Dressing Type Foam - Lift dressing to assess site every shift 03/20/23 0100     DVT prophylaxis: Eliquis Family Communication:none Status is: Inpatient Remains inpatient appropriate because: Right lower extremity cellulitis    Code Status:     Code Status Orders  (From admission, onward)           Start     Ordered   03/18/23 1240  Full code  Continuous       Question:  By:  Answer:  Consent: discussion documented in EHR   03/18/23 1240           Code Status History     Date Active Date Inactive Code Status Order ID Comments User Context   09/28/2022 2251 10/03/2022 1824 Full Code [id number]  Therisa Doyne, MD ED   04/17/2011 1129 04/20/2011 1804 Full Code [id number]  Benfield, Toula Moos, RN Inpatient         IV Access:   Peripheral IV   Procedures and diagnostic studies:   DG Foot 2 Views Left Result Date: 03/18/2023 CLINICAL DATA:  Foot ulcer.  Assess for possible osteomyelitis. EXAM: LEFT FOOT - 2 VIEW COMPARISON:  09/28/2022. FINDINGS: Ulcer location not definitive on the radiographs. On the lateral view, there is an apparent fracture of the distal aspect of the proximal phalanx of what is likely the fifth toe. No other evidence of a fracture. No definite bone resorption. Joints are normally aligned. There is soft tissue swelling most prominent along the forefoot. No soft tissue air. Calcification is noted along the Achilles tendon and plantar fascia. IMPRESSION: 1. Apparent fracture of the distal aspect of the proximal phalanx of what is likely the fifth toe, which could be the result of osteomyelitis, although this is not well-defined on this exam. There is no other evidence to suggest osteomyelitis. 2. Soft tissue swelling. No soft tissue air.  Electronically Signed   By: Amie Portland M.D.   On: 03/18/2023 11:27   DG Chest Port 1 View Result Date: 03/18/2023 CLINICAL DATA:  Possible sepsis. EXAM: PORTABLE CHEST 1 VIEW COMPARISON:  04/06/2011. FINDINGS: Cardiac silhouette is normal in size. No mediastinal or hilar masses. Mild chronic peripheral interstitial thickening. Lungs hyperexpanded but otherwise clear. No convincing pleural effusion.  No pneumothorax. Skeletal structures are grossly intact. IMPRESSION: 1. No no acute cardiopulmonary disease. Electronically Signed   By: Amie Portland M.D.   On: 03/18/2023 11:23     Medical Consultants:   None.   Subjective:    SHAUNITA SENEY has no new complaints today.  Objective:    Vitals:   03/19/23 1800 03/19/23 1900 03/19/23 2253 03/20/23 0252  BP: (!) 80/41 (!) 85/43 (!) 100/44 (!) 85/47  Pulse: 95 68 71 95  Resp: 20 (!) 22 20 20   Temp:  97.6 F (36.4 C) 98.4 F (36.9 C) 97.8 F (36.6 C)  TempSrc:  Axillary Axillary Oral  SpO2: 96% 96% 96% 97%   SpO2: 97 %   Intake/Output Summary (Last 24 hours) at 03/20/2023 5784 Last data filed at 03/20/2023 0400 Gross per  24 hour  Intake 540 ml  Output 450 ml  Net 90 ml   There were no vitals filed for this visit.  Exam: General exam: In no acute distress. Respiratory system: Good air movement and clear to auscultation. Cardiovascular system: S1 & S2 heard, RRR. No JVD. Gastrointestinal system: Abdomen is nondistended, soft and nontender.  Extremities: Persistent erythema Skin: Erythema is improved as multiple ulcers in her leg back and heel Psychiatry: Judgement and insight appear normal. Mood & affect appropriate.    Data Reviewed:    Labs: Basic Metabolic Panel: Recent Labs  Lab 03/18/23 1030 03/19/23 0344  NA 139 137  K 3.4* 3.1*  CL 102 106  CO2 21* 23  GLUCOSE 94 131*  BUN 30* 30*  CREATININE 1.05* 1.11*  CALCIUM 8.7* 7.5*   GFR CrCl cannot be calculated (Unknown ideal weight.). Liver Function  Tests: Recent Labs  Lab 03/18/23 1030 03/19/23 0344  AST 69* 38  ALT 38 27  ALKPHOS 49 36*  BILITOT 1.2 0.7  PROT 5.8* 4.2*  ALBUMIN 2.4* 1.7*   No results for input(s): "LIPASE", "AMYLASE" in the last 168 hours. No results for input(s): "AMMONIA" in the last 168 hours. Coagulation profile No results for input(s): "INR", "PROTIME" in the last 168 hours. COVID-19 Labs  No results for input(s): "DDIMER", "FERRITIN", "LDH", "CRP" in the last 72 hours.  Lab Results  Component Value Date   SARSCOV2NAA NEGATIVE 03/18/2023    CBC: Recent Labs  Lab 03/18/23 1030 03/19/23 0344  WBC 12.1* 9.7  NEUTROABS 10.1*  --   HGB 14.8 10.8*  HCT 43.6 32.5*  MCV 97.3 97.3  PLT 212 185   Cardiac Enzymes: Recent Labs  Lab 03/18/23 1030 03/19/23 1151  CKTOTAL 674* 422*   BNP (last 3 results) No results for input(s): "PROBNP" in the last 8760 hours. CBG: No results for input(s): "GLUCAP" in the last 168 hours. D-Dimer: No results for input(s): "DDIMER" in the last 72 hours. Hgb A1c: No results for input(s): "HGBA1C" in the last 72 hours. Lipid Profile: No results for input(s): "CHOL", "HDL", "LDLCALC", "TRIG", "CHOLHDL", "LDLDIRECT" in the last 72 hours. Thyroid function studies: No results for input(s): "TSH", "T4TOTAL", "T3FREE", "THYROIDAB" in the last 72 hours.  Invalid input(s): "FREET3" Anemia work up: No results for input(s): "VITAMINB12", "FOLATE", "FERRITIN", "TIBC", "IRON", "RETICCTPCT" in the last 72 hours. Sepsis Labs: Recent Labs  Lab 03/18/23 1030 03/18/23 1044 03/18/23 1301 03/19/23 0344  WBC 12.1*  --   --  9.7  LATICACIDVEN  --  2.1* 1.7  --    Microbiology Recent Results (from the past 240 hours)  Resp panel by RT-PCR (RSV, Flu A&B, Covid) Anterior Nasal Swab     Status: None   Collection Time: 03/18/23 10:16 AM   Specimen: Anterior Nasal Swab  Result Value Ref Range Status   SARS Coronavirus 2 by RT PCR NEGATIVE NEGATIVE Final   Influenza A by PCR  NEGATIVE NEGATIVE Final   Influenza B by PCR NEGATIVE NEGATIVE Final    Comment: (NOTE) The Xpert Xpress SARS-CoV-2/FLU/RSV plus assay is intended as an aid in the diagnosis of influenza from Nasopharyngeal swab specimens and should not be used as a sole basis for treatment. Nasal washings and aspirates are unacceptable for Xpert Xpress SARS-CoV-2/FLU/RSV testing.  Fact Sheet for Patients: BloggerCourse.com  Fact Sheet for Healthcare Providers: SeriousBroker.it  This test is not yet approved or cleared by the [place] FDA and has been authorized for detection and/or diagnosis of  SARS-CoV-2 by FDA under an Emergency Use Authorization (EUA). This EUA will remain in effect (meaning this test can be used) for the duration of the COVID-19 declaration under Section 564(b)(1) of the Act, 21 U.S.C. section 360bbb-3(b)(1), unless the authorization is terminated or revoked.     Resp Syncytial Virus by PCR NEGATIVE NEGATIVE Final    Comment: (NOTE) Fact Sheet for Patients: BloggerCourse.com  Fact Sheet for Healthcare Providers: SeriousBroker.it  This test is not yet approved or cleared by the [place] FDA and has been authorized for detection and/or diagnosis of SARS-CoV-2 by FDA under an Emergency Use Authorization (EUA). This EUA will remain in effect (meaning this test can be used) for the duration of the COVID-19 declaration under Section 564(b)(1) of the Act, 21 U.S.C. section 360bbb-3(b)(1), unless the authorization is terminated or revoked.  Performed at [facility], [address], [place], [place] [address]   Blood Culture (routine x 2)     Status: None (Preliminary result)   Collection Time: 03/18/23 10:16 AM   Specimen: BLOOD  Result Value Ref Range Status   Specimen Description BLOOD LEFT ANTECUBITAL  Final   Special Requests   Final    BOTTLES DRAWN  AEROBIC AND ANAEROBIC Blood Culture results may not be optimal due to an inadequate volume of blood received in culture bottles   Culture  Setup Time   Final    GRAM NEGATIVE RODS AEROBIC BOTTLE ONLY CRITICAL RESULT CALLED TO, READ BACK BY AND VERIFIED WITH: V BRYK,PHARMD@[id number]  03/19/23 MK Performed at [facility], [address], [place], [place] [address]    Culture GRAM NEGATIVE RODS  Final   Report Status PENDING  Incomplete  Blood Culture ID Panel (Reflexed)     Status: Abnormal   Collection Time: 03/18/23 10:16 AM  Result Value Ref Range Status   Enterococcus faecalis NOT DETECTED NOT DETECTED Final   Enterococcus Faecium NOT DETECTED NOT DETECTED Final   Listeria monocytogenes NOT DETECTED NOT DETECTED Final   Staphylococcus species NOT DETECTED NOT DETECTED Final   Staphylococcus aureus (BCID) NOT DETECTED NOT DETECTED Final   Staphylococcus epidermidis NOT DETECTED NOT DETECTED Final   Staphylococcus lugdunensis NOT DETECTED NOT DETECTED Final   Streptococcus species NOT DETECTED NOT DETECTED Final   Streptococcus agalactiae NOT DETECTED NOT DETECTED Final   Streptococcus pneumoniae NOT DETECTED NOT DETECTED Final   Streptococcus pyogenes NOT DETECTED NOT DETECTED Final   A.calcoaceticus-baumannii NOT DETECTED NOT DETECTED Final   Bacteroides fragilis NOT DETECTED NOT DETECTED Final   Enterobacterales DETECTED (A) NOT DETECTED Final    Comment: Enterobacterales represent a large order of gram negative bacteria, not a single organism. CRITICAL RESULT CALLED TO, READ BACK BY AND VERIFIED WITH: V BRYK,PHARMD@[id number]  03/19/23 MK    Enterobacter cloacae complex NOT DETECTED NOT DETECTED Final   Escherichia coli NOT DETECTED NOT DETECTED Final   Klebsiella aerogenes NOT DETECTED NOT DETECTED Final   Klebsiella oxytoca NOT DETECTED NOT DETECTED Final   Klebsiella pneumoniae NOT DETECTED NOT DETECTED Final   Proteus species DETECTED (A) NOT DETECTED Final    Comment: CRITICAL  RESULT CALLED TO, READ BACK BY AND VERIFIED WITH: V BRYK,PHARMD@0410  03/19/23 MK    Salmonella species NOT DETECTED NOT DETECTED Final   Serratia marcescens NOT DETECTED NOT DETECTED Final   Haemophilus influenzae NOT DETECTED NOT DETECTED Final   Neisseria meningitidis NOT DETECTED NOT DETECTED Final   Pseudomonas aeruginosa NOT DETECTED NOT DETECTED Final   Stenotrophomonas maltophilia NOT DETECTED  NOT DETECTED Final   Candida albicans NOT DETECTED NOT DETECTED Final   Candida auris NOT DETECTED NOT DETECTED Final   Candida glabrata NOT DETECTED NOT DETECTED Final   Candida krusei NOT DETECTED NOT DETECTED Final   Candida parapsilosis NOT DETECTED NOT DETECTED Final   Candida tropicalis NOT DETECTED NOT DETECTED Final   Cryptococcus neoformans/gattii NOT DETECTED NOT DETECTED Final   CTX-M ESBL NOT DETECTED NOT DETECTED Final   Carbapenem resistance IMP NOT DETECTED NOT DETECTED Final   Carbapenem resistance KPC NOT DETECTED NOT DETECTED Final   Carbapenem resistance NDM NOT DETECTED NOT DETECTED Final   Carbapenem resist OXA 48 LIKE NOT DETECTED NOT DETECTED Final   Carbapenem resistance VIM NOT DETECTED NOT DETECTED Final    Comment: Performed at [facility], [address], [place], [place] [address]  Blood Culture (routine x 2)     Status: None (Preliminary result)   Collection Time: 03/18/23 10:21 AM   Specimen: BLOOD RIGHT HAND  Result Value Ref Range Status   Specimen Description BLOOD RIGHT HAND  Final   Special Requests   Final    AEROBIC BOTTLE ONLY Blood Culture results may not be optimal due to an inadequate volume of blood received in culture bottles   Culture   Final    NO GROWTH 2 DAYS Performed at [facility], [address] [place], [place] [address]    Report Status PENDING  Incomplete  Urine Culture     Status: None   Collection Time: 03/18/23 11:00 PM   Specimen: Urine, Random  Result Value Ref Range Status   Specimen Description URINE,  RANDOM  Final   Special Requests NONE Reflexed from [id number]  Final   Culture   Final    NO GROWTH Performed at [facility], [address], [place], [place] [address]    Report Status 03/19/2023 FINAL  Final     Medications:    apixaban  5 mg Oral BID   Chlorhexidine Gluconate Cloth  6 each Topical Daily   feeding supplement  237 mL Oral BID BM   midodrine  10 mg Oral TID WC   Continuous Infusions:  cefTRIAXone (ROCEPHIN)  IV 2 g (03/19/23 0922)   vancomycin 1,000 mg (03/19/23 1311)      LOS: 2 days   Marinda Elk  [facility]  03/20/2023, 7:13 AM

## 2023-03-20 NOTE — Plan of Care (Signed)
   Problem: Nutrition: Goal: Adequate nutrition will be maintained Outcome: Progressing

## 2023-03-20 NOTE — Plan of Care (Signed)
   Problem: Coping: Goal: Level of anxiety will decrease Outcome: Progressing

## 2023-03-20 NOTE — Consult Note (Addendum)
 WOC Nurse Consult Note: Reason for Consult: Requested to assess BLE and Sacrum/buttock wounds. Wound type: deep pressure injury on sacrum and buttocks. BLE with full thickness, venous insufficiency. Pressure Injury POA: Yes  Measurement: Right leg upper area - trauma for the last wrap (pt was using unna boot prior admitted) 3 cm x 0.5 cm Wound bed: 100% red 2 - wound on the knee - 1.5 x 1.5 cm, cover by black crust, partial debrided. Wound bed: 60% yellow, 40% black scab.  Measurement: Left leg middle area - after removed part of the scabs, 7 cm x 3 cm Wound bed: 100% red, dermis exposion. Ankle pressure injury - 3 cm x 5 cm, cover by black crust, partial debrided. Wound bed: 60% yellow, 40% black scab. Drainage (amount, consistency, odor) minimum amount  Periwound: cover by scabs, edema 2+/4+  Dressing procedure/placement/frequency: Clean both legs with warm water and wash cloth. Pat dry, clean the wounds with Vashe [id number]; Apply Aquacel [id number] on the wound bed (Right leg and left ankle).  Left leg: apply one layer of Xeroform, suspended when healed. Both legs: Wrap with Kerlix and abd wrap. Beginning on the base of the toes, until the knees. Apply a light compression through the leg. Use Prevalon boot on left side.  WOC Nurse Consult Note: Reason for Consult: Sacrum/buttock wounds. Wound type: extensive deep pressure injury on sacrum and buttocks. Wound bed: 100% purple skin. Blister on the right side of the buttock, not ruptured. Drainage (amount, consistency, odor) minimum amount  Periwound: intact  Dressing procedure/placement/frequency: Apply foam dressing, change every 3 days or PRN.  WOC team will not plan to follow further.  Please reconsult if further assistance is needed. Thank-you,  Denyse Amass BSN, RN, [employer], WOC  (Pager: [phone])

## 2023-03-21 DIAGNOSIS — L89302 Pressure ulcer of unspecified buttock, stage 2: Secondary | ICD-10-CM | POA: Diagnosis not present

## 2023-03-21 DIAGNOSIS — N179 Acute kidney failure, unspecified: Secondary | ICD-10-CM | POA: Diagnosis not present

## 2023-03-21 DIAGNOSIS — R627 Adult failure to thrive: Secondary | ICD-10-CM | POA: Diagnosis not present

## 2023-03-21 DIAGNOSIS — L03116 Cellulitis of left lower limb: Secondary | ICD-10-CM | POA: Diagnosis not present

## 2023-03-21 LAB — BASIC METABOLIC PANEL
Anion gap: 6 (ref 5–15)
BUN: 27 mg/dL — ABNORMAL HIGH (ref 8–23)
CO2: 27 mmol/L (ref 22–32)
Calcium: 7.9 mg/dL — ABNORMAL LOW (ref 8.9–10.3)
Chloride: 104 mmol/L (ref 98–111)
Creatinine, Ser: 0.74 mg/dL (ref 0.44–1.00)
GFR, Estimated: >60 mL/min (ref >60)
Glucose, Bld: 113 mg/dL — ABNORMAL HIGH (ref 70–99)
Potassium: 4.2 mmol/L (ref 3.5–5.1)
Sodium: 137 mmol/L (ref 135–145)

## 2023-03-21 LAB — CULTURE, BLOOD (ROUTINE X 2)

## 2023-03-21 NOTE — TOC Progression Note (Addendum)
 Transition of Care [facility]) - Progression Note    Patient Details  Name: Shelby Wiley MRN: [id number] Date of Birth: [birth year]  [facility] CM/SW Contact  Eduard Roux, [place] Phone Number: 03/21/2023, 2:46 PM  Clinical Narrative:     Per chart review APS report was made 03/16 by CSW Cherish. Per Cherish, APS intake return call on 03/17 and advised the report has been accepted.   Antony Blackbird, MSW, LCSW Clinical Social Worker    Expected Discharge Plan: Skilled Nursing Facility Barriers to Discharge: Continued Medical Work up  Expected Discharge Plan and Services In-house Referral: Clinical Social Work     Living arrangements for the past 2 months: Single Family Home                                       Social Determinants of Health (SDOH) Interventions SDOH Screenings   Food Insecurity: No Food Insecurity (03/18/2023)  Housing: Low Risk  (03/18/2023)  Transportation Needs: Unmet Transportation Needs (03/18/2023)  Utilities: Not At Risk (03/18/2023)  Social Connections: Moderately Isolated (03/18/2023)  Tobacco Use: Medium Risk (03/18/2023)    Readmission Risk Interventions    09/29/2022    4:02 PM  Readmission Risk Prevention Plan  Post Dischage Appt Complete  Medication Screening Complete  Transportation Screening Complete

## 2023-03-21 NOTE — Plan of Care (Signed)
  Problem: Coping: Goal: Level of anxiety will decrease Outcome: Not Progressing, continues to have periods of confusion with anxiety about care and what is happening.   Problem: Elimination: Goal: Will not experience complications related to bowel motility Outcome: Progressing, pt had a moderate bowel movement Problem: Pain Managment: Goal: General experience of comfort will improve and/or be controlled Outcome: Progressing, no complaints of pain just discomfort with dressing changes.

## 2023-03-21 NOTE — Progress Notes (Signed)
 Cleansed and changed bilateral heel and lower leg dressings. Placed xeroform to left heel and left lateral calf. Foam heel dressings applied and kerlix and compression wrap to bilateral lower extremities. Aquacel to small area on left shin, and two areas on right tibia. Pt tolerated well. Pt resting with call bell within reach.  Will continue to monitor.

## 2023-03-21 NOTE — TOC Transition Note (Signed)
 Transition of Care [facility]) - Discharge Note   Patient Details  Name: Shelby Wiley MRN: [id number] Date of Birth: [birth year]  [facility] CM/SW Contact:  Eduard Roux, LCSW Phone Number: 03/21/2023, 2:27 PM   Clinical Narrative:     CSW met with patient at bedside. CSW introduced self and explained role. CSW discussed therapy recommendations for short term rehab. Patient states she lives home alone. She states she can take care of herself but is agreeable to short term rehab before she go home- " I will go somewhere before but then I"m going home". Patient was suspicious of the process, stating "I do not want to be tricked". CSW explained the SNF process and she would be the one making the decision on which facility. Patient agreed. All questions answered.   TOC will provide bed offers once available  TOC will continue to follow and assist with discharge planning.   Antony Blackbird, MSW, LCSW Clinical Social Worker    Final next level of care: Skilled Nursing Facility Barriers to Discharge: Continued Medical Work up   Patient Goals and CMS Choice            Discharge Placement                       Discharge Plan and Services Additional resources added to the After Visit Summary for   In-house Referral: Clinical Social Work                                   Social Drivers of Health (SDOH) Interventions SDOH Screenings   Food Insecurity: No Food Insecurity (03/18/2023)  Housing: Low Risk  (03/18/2023)  Transportation Needs: Unmet Transportation Needs (03/18/2023)  Utilities: Not At Risk (03/18/2023)  Social Connections: Moderately Isolated (03/18/2023)  Tobacco Use: Medium Risk (03/18/2023)     Readmission Risk Interventions    09/29/2022    4:02 PM  Readmission Risk Prevention Plan  Post Dischage Appt Complete  Medication Screening Complete  Transportation Screening Complete

## 2023-03-21 NOTE — [place] FL2 (Signed)
 [place] MEDICAID FL2 LEVEL OF CARE FORM     IDENTIFICATION  Patient Name: Shelby Wiley Birthdate: 08-02-1935 Sex: female Admission Date (Current Location): 03/18/2023  [facility] and [place] Number:  Producer, television/film/video and Address:  [facility], [address] [place], [place] [address]      Provider Number: [id number]  Attending Physician Name and Address:  Marinda Elk, MD  Relative Name and Phone Number:       Current Level of Care: Hospital Recommended Level of Care: Skilled Nursing Facility Prior Approval Number:    Date Approved/Denied:   PASRR Number: [id number]  Discharge Plan: SNF    Current Diagnoses: Patient Active Problem List   Diagnosis Date Noted   Cellulitis of left lower extremity 03/18/2023   AKI (acute kidney injury) (HCC) 03/18/2023   Cellulitis of right lower extremity 03/18/2023   Moderate aortic stenosis - mean gradient 22.5  mHg. peak gradient 37.3  mmHg 10/01/2022   Bilateral cellulitis of lower leg 09/28/2022   A-fib (HCC) 09/28/2022   Pain due to onychomycosis of toenails of both feet 05/27/2019   Venous (peripheral) insufficiency 05/27/2019   Lymphedema 05/27/2019   Hypokalemia 04/20/2011   OA (osteoarthritis) of knee 04/17/2011   TIA 11/02/2009   HYPERGLYCEMIA, BORDERLINE 07/20/2009   EYE FLOATERS, LEFT 07/02/2009   FATIGUE 07/02/2009   WART, VIRAL 11/20/2008   Vitreous degeneration 11/20/2008   HEMORRHOIDS, INTERNAL 06/25/2008   EXTERNAL HEMORRHOIDS 06/25/2008   KERATOSIS PILARIS 06/25/2008   HYPERTENSION, BORDERLINE 02/26/2007   SHOULDER PAIN, RIGHT 02/26/2007   OBESITY, MORBID 05/05/2006   INSUFFICIENCY, VENOUS NOS 05/05/2006   OSTEOARTHRITIS 05/05/2006   DIVERTICULOSIS, COLON 12/08/2003    Orientation RESPIRATION BLADDER Height & Weight     Self, Time, Situation, Place  Normal External catheter, Continent Weight:   Height:     BEHAVIORAL SYMPTOMS/MOOD NEUROLOGICAL BOWEL NUTRITION  STATUS      Continent  (please see discharge summary)  AMBULATORY STATUS COMMUNICATION OF NEEDS Skin   Extensive Assist Verbally Other (Comment) (pressue injury Rt Buttocks , Deep Tissue pressure injury, Pressure injury LF heel medial stage II, pressure injury vertebral column upper LF stage II, pressure injury LF buttocks stage II, wound/incision dermatitis coccyx RT,LF wound/incison pretibial LF)                       Personal Care Assistance Level of Assistance  Bathing, Feeding, Dressing Bathing Assistance: Maximum assistance Feeding assistance: Limited assistance Dressing Assistance: Maximum assistance     Functional Limitations Info  Sight, Hearing, Speech Sight Info: Adequate Hearing Info: Adequate Speech Info: Adequate    SPECIAL CARE FACTORS FREQUENCY  PT (By licensed PT), OT (By licensed OT)     PT Frequency: 5x per week OT Frequency: 5x per week            Contractures Contractures Info: Not present    Additional Factors Info  Code Status, Allergies Code Status Info: FULL Allergies Info: Codeine           Current Medications (03/21/2023):  This is the current hospital active medication list Current Facility-Administered Medications  Medication Dose Route Frequency Provider Last Rate Last Admin   acetaminophen (TYLENOL) tablet 650 mg  650 mg Oral Q6H PRN Marinda Elk, MD       Or   acetaminophen (TYLENOL) suppository 650 mg  650 mg Rectal Q6H PRN Marinda Elk, MD       apixaban (  ELIQUIS) tablet 5 mg  5 mg Oral BID Marinda Elk, MD   5 mg at 03/21/23 0841   cefTRIAXone (ROCEPHIN) 2 g in sodium chloride 0.9 % 100 mL IVPB  2 g Intravenous Q24H Marinda Elk, MD 200 mL/hr at 03/21/23 1118 2 g at 03/21/23 1118   Chlorhexidine Gluconate Cloth 2 % PADS 6 each  6 each Topical Daily Marinda Elk, MD   6 each at 03/21/23 1119   feeding supplement (ENSURE ENLIVE / ENSURE PLUS) liquid 237 mL  237 mL Oral BID BM Marinda Elk, MD   237 mL at 03/20/23 1624   midodrine (PROAMATINE) tablet 10 mg  10 mg Oral TID WC Marinda Elk, MD   10 mg at 03/21/23 1119   ondansetron (ZOFRAN) tablet 4 mg  4 mg Oral Q6H PRN Marinda Elk, MD       Or   ondansetron [facility]) injection 4 mg  4 mg Intravenous Q6H PRN Marinda Elk, MD       polyethylene glycol [facility]) packet 17 g  17 g Oral Daily PRN Marinda Elk, MD         Discharge Medications: Please see discharge summary for a list of discharge medications.  Relevant Imaging Results:  Relevant Lab Results:   Additional Information SSN [id number]  Eduard Roux, LCSW

## 2023-03-21 NOTE — Progress Notes (Signed)
 Pt had moderate BM. Cleaned pt and changed linen. CHB bath and sacral dressing, bilateral buttocks dressings, small blister to medial lower vertebrae and small foam dressing to left upper back. All areas cleansed and new dressings applied. Pt tolerated procedure well. Pt resting with call bell within reach.  Will continue to monitor.

## 2023-03-21 NOTE — Progress Notes (Signed)
 [facility] PROGRESS NOTE    Progress Note  Shelby Wiley  YQM:[id number] DOB: 17-Aug-1935 DOA: 03/18/2023 PCP: Clayborn Heron, MD    Brief Narrative:   Shelby Wiley is an 88 y.o. female past medical history of chronic leg wounds, mild lymphedema comes in for left lower extremity cellulitis.  One of her neighbors called EMS as they have not seen her in 4 days, EMS found to sitting in the toilet.  Assessment/Plan:  Sepsis secondary to Proteus bacteremia: Blood culture ID panel was positive for Proteus. Blood culture grew Proteus Hauseri Continue to hold Coreg.  He was started on IV fluids and midodrine, her blood pressure slowly improving, tachycardia resolved. Has remained afebrile leukocytosis improved. Wound care has been consulted. She is awake today but she still feels tired but a little bit better than yesterday. PT evaluated the patient will need skilled nursing facility placement, TOC has been notified.  Acute kidney injury: Baseline creatinine is less than 1. She was started on IV fluids and midodrine. Her creatinine has returned to baseline.  Hypokalemia: Potassium was repleted orally. Now improved  Possible fracture of the distal aspect of the proximal phalanges of the fifth toe: Conservative management.   No evidence of osteomyelitis.  Normocytic anemia: She relates no signs of overt bleeding. Hemoglobin was 14 on admission now was 10.8 she was obviously severely hemoconcentrated. Plan about 3 L of fluid. Baseline hemoglobin is like around 11.  Bilateral lymphedema: Consult wound care  Paroxysmal A-fib with RVR: Initially rate controlled Coreg initially but her blood pressure did not improve, so Coreg was discontinued.,  She was continue Eliquis. RVR is improved.    OBESITY, MORBID Noted  Sacral decubitus ulcer stage II present on admission: Continue recommendations per wound care nurse. RN Pressure Injury Documentation: Pressure Injury  03/18/23 Buttocks Right Deep Tissue Pressure Injury - Purple or maroon localized area of discolored intact skin or blood-filled blister due to damage of underlying soft tissue from pressure and/or shear. black, closed (Active)  03/18/23 1800  Location: Buttocks  Location Orientation: Right  Staging: Deep Tissue Pressure Injury - Purple or maroon localized area of discolored intact skin or blood-filled blister due to damage of underlying soft tissue from pressure and/or shear.  Wound Description (Comments): black, closed  Present on Admission: Yes  Dressing Type Foam - Lift dressing to assess site every shift 03/20/23 2000     Pressure Injury 03/19/23 Heel Left;Medial Stage 3 -  Full thickness tissue loss. Subcutaneous fat may be visible but bone, tendon or muscle are NOT exposed. open wound with white slough with drainage (Active)  03/19/23 2200  Location: Heel  Location Orientation: Left;Medial  Staging: Stage 3 -  Full thickness tissue loss. Subcutaneous fat may be visible but bone, tendon or muscle are NOT exposed.  Wound Description (Comments): open wound with white slough with drainage  Present on Admission: Yes  Dressing Type Foam - Lift dressing to assess site every shift 03/20/23 2000     Pressure Injury 03/19/23 Vertebral column Left;Upper Stage 2 -  Partial thickness loss of dermis presenting as a shallow open injury with a red, pink wound bed without slough. (Active)  03/19/23 2200  Location: Vertebral column  Location Orientation: Left;Upper  Staging: Stage 2 -  Partial thickness loss of dermis presenting as a shallow open injury with a red, pink wound bed without slough.  Wound Description (Comments):   Present on Admission: Yes  Dressing Type Foam - Lift dressing to  assess site every shift 03/20/23 2000     Pressure Injury 03/19/23 Buttocks Left Stage 2 -  Partial thickness loss of dermis presenting as a shallow open injury with a red, pink wound bed without slough. with DTI  (Active)  03/19/23 2200  Location: Buttocks  Location Orientation: Left  Staging: Stage 2 -  Partial thickness loss of dermis presenting as a shallow open injury with a red, pink wound bed without slough.  Wound Description (Comments): with DTI  Present on Admission: Yes  Dressing Type Foam - Lift dressing to assess site every shift 03/20/23 2000     DVT prophylaxis: Eliquis Family Communication:none Status is: Inpatient Remains inpatient appropriate because: Right lower extremity cellulitis    Code Status:     Code Status Orders  (From admission, onward)           Start     Ordered   03/18/23 1240  Full code  Continuous       Question:  By:  Answer:  Consent: discussion documented in EHR   03/18/23 1240           Code Status History     Date Active Date Inactive Code Status Order ID Comments User Context   09/28/2022 2251 10/03/2022 1824 Full Code [id number]  Therisa Doyne, MD ED   04/17/2011 1129 04/20/2011 1804 Full Code [id number]  [place], Toula Moos, RN Inpatient         IV Access:   Peripheral IV   Procedures and diagnostic studies:   No results found.    Medical Consultants:   None.   Subjective:    Shelby Wiley has no new complaints today.  Objective:    Vitals:   03/20/23 1700 03/20/23 1900 03/20/23 2256 03/21/23 0258  BP: 99/61 108/73 103/60 114/71  Pulse: 93 (!) 116 91 87  Resp: (!) 30 20 20 20   Temp:  97.6 F (36.4 C) 98 F (36.7 C) 98.3 F (36.8 C)  TempSrc:  Oral Oral Oral  SpO2: 94% 94% 93% 97%   SpO2: 97 %   Intake/Output Summary (Last 24 hours) at 03/21/2023 0754 Last data filed at 03/21/2023 0300 Gross per 24 hour  Intake 191.47 ml  Output 620 ml  Net -428.53 ml   There were no vitals filed for this visit.  Exam: General exam: In no acute distress. Respiratory system: Good air movement and clear to auscultation. Cardiovascular system: S1 & S2 heard, RRR. No JVD. Gastrointestinal system: Abdomen  is nondistended, soft and nontender.  Extremities: No pedal edema. Skin: No rashes, lesions or ulcers Psychiatry: Judgement and insight appear normal. Mood & affect appropriate.  Data Reviewed:    Labs: Basic Metabolic Panel: Recent Labs  Lab 03/18/23 1030 03/19/23 0344 03/20/23 0749 03/21/23 0307  NA 139 137 140 137  K 3.4* 3.1* 4.4 4.2  CL 102 106 106 104  CO2 21* 23 25 27   GLUCOSE 94 131* 104* 113*  BUN 30* 30* 29* 27*  CREATININE 1.05* 1.11* 0.75 0.74  CALCIUM 8.7* 7.5* 7.7* 7.9*   GFR CrCl cannot be calculated (Unknown ideal weight.). Liver Function Tests: Recent Labs  Lab 03/18/23 1030 03/19/23 0344  AST 69* 38  ALT 38 27  ALKPHOS 49 36*  BILITOT 1.2 0.7  PROT 5.8* 4.2*  ALBUMIN 2.4* 1.7*   No results for input(s): "LIPASE", "AMYLASE" in the last 168 hours. No results for input(s): "AMMONIA" in the last 168 hours. Coagulation profile No results for input(s): "INR", "PROTIME"  in the last 168 hours. COVID-19 Labs  No results for input(s): "DDIMER", "FERRITIN", "LDH", "CRP" in the last 72 hours.  Lab Results  Component Value Date   SARSCOV2NAA NEGATIVE 03/18/2023    CBC: Recent Labs  Lab 03/18/23 1030 03/19/23 0344  WBC 12.1* 9.7  NEUTROABS 10.1*  --   HGB 14.8 10.8*  HCT 43.6 32.5*  MCV 97.3 97.3  PLT 212 185   Cardiac Enzymes: Recent Labs  Lab 03/18/23 1030 03/19/23 1151  CKTOTAL 674* 422*   BNP (last 3 results) No results for input(s): "PROBNP" in the last 8760 hours. CBG: No results for input(s): "GLUCAP" in the last 168 hours. D-Dimer: No results for input(s): "DDIMER" in the last 72 hours. Hgb A1c: No results for input(s): "HGBA1C" in the last 72 hours. Lipid Profile: No results for input(s): "CHOL", "HDL", "LDLCALC", "TRIG", "CHOLHDL", "LDLDIRECT" in the last 72 hours. Thyroid function studies: No results for input(s): "TSH", "T4TOTAL", "T3FREE", "THYROIDAB" in the last 72 hours.  Invalid input(s): "FREET3" Anemia work  up: No results for input(s): "VITAMINB12", "FOLATE", "FERRITIN", "TIBC", "IRON", "RETICCTPCT" in the last 72 hours. Sepsis Labs: Recent Labs  Lab 03/18/23 1030 03/18/23 1044 03/18/23 1301 03/19/23 0344  WBC 12.1*  --   --  9.7  LATICACIDVEN  --  2.1* 1.7  --    Microbiology Recent Results (from the past 240 hours)  Resp panel by RT-PCR (RSV, Flu A&B, Covid) Anterior Nasal Swab     Status: None   Collection Time: 03/18/23 10:16 AM   Specimen: Anterior Nasal Swab  Result Value Ref Range Status   SARS Coronavirus 2 by RT PCR NEGATIVE NEGATIVE Final   Influenza A by PCR NEGATIVE NEGATIVE Final   Influenza B by PCR NEGATIVE NEGATIVE Final    Comment: (NOTE) The Xpert Xpress SARS-CoV-2/FLU/RSV plus assay is intended as an aid in the diagnosis of influenza from Nasopharyngeal swab specimens and should not be used as a sole basis for treatment. Nasal washings and aspirates are unacceptable for Xpert Xpress SARS-CoV-2/FLU/RSV testing.  Fact Sheet for Patients: BloggerCourse.com  Fact Sheet for Healthcare Providers: SeriousBroker.it  This test is not yet approved or cleared by the [place] FDA and has been authorized for detection and/or diagnosis of SARS-CoV-2 by FDA under an Emergency Use Authorization (EUA). This EUA will remain in effect (meaning this test can be used) for the duration of the COVID-19 declaration under Section 564(b)(1) of the Act, 21 U.S.C. section 360bbb-3(b)(1), unless the authorization is terminated or revoked.     Resp Syncytial Virus by PCR NEGATIVE NEGATIVE Final    Comment: (NOTE) Fact Sheet for Patients: BloggerCourse.com  Fact Sheet for Healthcare Providers: SeriousBroker.it  This test is not yet approved or cleared by the [place] FDA and has been authorized for detection and/or diagnosis of SARS-CoV-2 by FDA under an Emergency Use  Authorization (EUA). This EUA will remain in effect (meaning this test can be used) for the duration of the COVID-19 declaration under Section 564(b)(1) of the Act, 21 U.S.C. section 360bbb-3(b)(1), unless the authorization is terminated or revoked.  Performed at [facility], [address] [place], [place] [address]   Blood Culture (routine x 2)     Status: Abnormal   Collection Time: 03/18/23 10:16 AM   Specimen: BLOOD  Result Value Ref Range Status   Specimen Description BLOOD LEFT ANTECUBITAL  Final   Special Requests   Final    BOTTLES DRAWN AEROBIC AND ANAEROBIC Blood Culture results  may not be optimal due to an inadequate volume of blood received in culture bottles   Culture  Setup Time   Final    GRAM NEGATIVE RODS AEROBIC BOTTLE ONLY CRITICAL RESULT CALLED TO, READ BACK BY AND VERIFIED WITH: V BRYK,PHARMD@0410  03/19/23 MK Performed at [facility], [address], [place], [place] [address]    Culture PROTEUS HAUSERI (A)  Final   Report Status 03/21/2023 FINAL  Final   Organism ID, Bacteria PROTEUS HAUSERI  Final   Organism ID, Bacteria PROTEUS HAUSERI  Final      Susceptibility   Proteus hauseri - KIRBY BAUER*    CEFAZOLIN RESISTANT Resistant    Proteus hauseri - MIC*    AMPICILLIN >=32 RESISTANT Resistant     CEFEPIME <=0.12 SENSITIVE Sensitive     CEFTAZIDIME <=1 SENSITIVE Sensitive     CEFTRIAXONE <=0.25 SENSITIVE Sensitive     CIPROFLOXACIN <=0.25 SENSITIVE Sensitive     GENTAMICIN <=1 SENSITIVE Sensitive     IMIPENEM 2 SENSITIVE Sensitive     TRIMETH/SULFA <=20 SENSITIVE Sensitive     AMPICILLIN/SULBACTAM 16 INTERMEDIATE Intermediate     PIP/TAZO <=4 SENSITIVE Sensitive ug/mL    * PROTEUS HAUSERI    PROTEUS HAUSERI  Blood Culture ID Panel (Reflexed)     Status: Abnormal   Collection Time: 03/18/23 10:16 AM  Result Value Ref Range Status   Enterococcus faecalis NOT DETECTED NOT DETECTED Final   Enterococcus Faecium NOT DETECTED NOT DETECTED  Final   Listeria monocytogenes NOT DETECTED NOT DETECTED Final   Staphylococcus species NOT DETECTED NOT DETECTED Final   Staphylococcus aureus (BCID) NOT DETECTED NOT DETECTED Final   Staphylococcus epidermidis NOT DETECTED NOT DETECTED Final   Staphylococcus lugdunensis NOT DETECTED NOT DETECTED Final   Streptococcus species NOT DETECTED NOT DETECTED Final   Streptococcus agalactiae NOT DETECTED NOT DETECTED Final   Streptococcus pneumoniae NOT DETECTED NOT DETECTED Final   Streptococcus pyogenes NOT DETECTED NOT DETECTED Final   A.calcoaceticus-baumannii NOT DETECTED NOT DETECTED Final   Bacteroides fragilis NOT DETECTED NOT DETECTED Final   Enterobacterales DETECTED (A) NOT DETECTED Final    Comment: Enterobacterales represent a large order of gram negative bacteria, not a single organism. CRITICAL RESULT CALLED TO, READ BACK BY AND VERIFIED WITH: V BRYK,PHARMD@0410  03/19/23 MK    Enterobacter cloacae complex NOT DETECTED NOT DETECTED Final   Escherichia coli NOT DETECTED NOT DETECTED Final   Klebsiella aerogenes NOT DETECTED NOT DETECTED Final   Klebsiella oxytoca NOT DETECTED NOT DETECTED Final   Klebsiella pneumoniae NOT DETECTED NOT DETECTED Final   Proteus species DETECTED (A) NOT DETECTED Final    Comment: CRITICAL RESULT CALLED TO, READ BACK BY AND VERIFIED WITH: V BRYK,PHARMD@0410  03/19/23 MK    Salmonella species NOT DETECTED NOT DETECTED Final   Serratia marcescens NOT DETECTED NOT DETECTED Final   Haemophilus influenzae NOT DETECTED NOT DETECTED Final   Neisseria meningitidis NOT DETECTED NOT DETECTED Final   Pseudomonas aeruginosa NOT DETECTED NOT DETECTED Final   Stenotrophomonas maltophilia NOT DETECTED NOT DETECTED Final   Candida albicans NOT DETECTED NOT DETECTED Final   Candida auris NOT DETECTED NOT DETECTED Final   Candida glabrata NOT DETECTED NOT DETECTED Final   Candida krusei NOT DETECTED NOT DETECTED Final   Candida parapsilosis NOT DETECTED NOT  DETECTED Final   Candida tropicalis NOT DETECTED NOT DETECTED Final   Cryptococcus neoformans/gattii NOT DETECTED NOT DETECTED Final   CTX-M ESBL NOT DETECTED NOT DETECTED Final   Carbapenem resistance  IMP NOT DETECTED NOT DETECTED Final   Carbapenem resistance KPC NOT DETECTED NOT DETECTED Final   Carbapenem resistance NDM NOT DETECTED NOT DETECTED Final   Carbapenem resist OXA 48 LIKE NOT DETECTED NOT DETECTED Final   Carbapenem resistance VIM NOT DETECTED NOT DETECTED Final    Comment: Performed at [facility], [address] [place], [place] [address]  Blood Culture (routine x 2)     Status: None (Preliminary result)   Collection Time: 03/18/23 10:21 AM   Specimen: BLOOD RIGHT HAND  Result Value Ref Range Status   Specimen Description BLOOD RIGHT HAND  Final   Special Requests   Final    AEROBIC BOTTLE ONLY Blood Culture results may not be optimal due to an inadequate volume of blood received in culture bottles   Culture   Final    NO GROWTH 3 DAYS Performed at [facility], [address] [place], [place] [address]    Report Status PENDING  Incomplete  Urine Culture     Status: None   Collection Time: 03/18/23 11:00 PM   Specimen: Urine, Random  Result Value Ref Range Status   Specimen Description URINE, RANDOM  Final   Special Requests NONE Reflexed from [id number]  Final   Culture   Final    NO GROWTH Performed at [facility], [address], [place], [place] [address]    Report Status 03/19/2023 FINAL  Final     Medications:    apixaban  5 mg Oral BID   Chlorhexidine Gluconate Cloth  6 each Topical Daily   feeding supplement  237 mL Oral BID BM   midodrine  10 mg Oral TID WC   Continuous Infusions:  sodium chloride 75 mL/hr at 03/21/23 0015   cefTRIAXone (ROCEPHIN)  IV 2 g (03/20/23 1206)      LOS: 3 days   Marinda Elk  [facility]  03/21/2023, 7:54 AM

## 2023-03-21 NOTE — Plan of Care (Signed)
   Problem: Coping: Goal: Level of anxiety will decrease Outcome: Progressing

## 2023-03-22 DIAGNOSIS — L03116 Cellulitis of left lower limb: Secondary | ICD-10-CM | POA: Diagnosis not present

## 2023-03-22 LAB — BASIC METABOLIC PANEL
Anion gap: 4 — ABNORMAL LOW (ref 5–15)
BUN: 21 mg/dL (ref 8–23)
CO2: 25 mmol/L (ref 22–32)
Calcium: 7.7 mg/dL — ABNORMAL LOW (ref 8.9–10.3)
Chloride: 105 mmol/L (ref 98–111)
Creatinine, Ser: 0.77 mg/dL (ref 0.44–1.00)
GFR, Estimated: >60 mL/min (ref >60)
Glucose, Bld: 110 mg/dL — ABNORMAL HIGH (ref 70–99)
Potassium: 4.3 mmol/L (ref 3.5–5.1)
Sodium: 134 mmol/L — ABNORMAL LOW (ref 135–145)

## 2023-03-22 MED ORDER — METOPROLOL TARTRATE 12.5 MG HALF TABLET
12.5000 mg | ORAL_TABLET | Freq: Two times a day (BID) | ORAL | Status: DC
  Administered 2023-03-22 – 2023-03-24 (×4): 12.5 mg via ORAL
  Filled 2023-03-22 (×5): qty 1

## 2023-03-22 NOTE — Progress Notes (Signed)
 Physical Therapy Treatment Patient Details Name: Shelby Wiley MRN: [id number] DOB: 1935-04-13 Today's Date: 03/22/2023   History of Present Illness 88 y.o. female comes in 03/18/23 for generalized weakness and confusion. EMS report she was last seen by an individual about 4 days PTA and neighbor called EMS. Found covered in stool and urine. +RLE cellulitis, AKI, hypokalemia, afib with RVR  PMH chronic leg wounds, mild lymphedema, essential hypertension    PT Comments  Pt in bed upon arrival and agreeable to PT session. Pt is very fearful of falling and did well with step by step instructions of the goals for therapy. Pt was agreeable to sit on the edge of the bed and required TotalAx2 using helicopter method. Pt preferred to have a third person assist with LE management, however, anticipate pt only needed x2 assist. Pt had difficulty maintaining seated balance and required MaxA support for posterior lean. Worked on pt trying to pull self forwards and get feet flat on the ground. Pt is progressing slowly towards goals. Pt would continue to benefit from <3hrs post acute rehab to work towards independence with mobility. Acute PT to follow.      If plan is discharge home, recommend the following: Two people to help with walking and/or transfers;Two people to help with bathing/dressing/bathroom;Direct supervision/assist for medications management;Direct supervision/assist for financial management;Supervision due to cognitive status   Can travel by private vehicle     No  Equipment Recommendations  Wheelchair (measurements PT);Wheelchair cushion (measurements PT);Hospital bed;Hoyer lift       Precautions / Restrictions Precautions Precautions: Fall Recall of Precautions/Restrictions: Impaired Restrictions Weight Bearing Restrictions Per Provider Order: No     Mobility  Bed Mobility Overal bed mobility: Needs Assistance Bed Mobility: Supine to Sit, Sit to Supine    Supine to sit: Total  assist, +2 for physical assistance, +2 for safety/equipment, HOB elevated Sit to supine: Total assist, +2 for physical assistance, +2 for safety/equipment   General bed mobility comments: pt unable to assist with bed mobility, prefers to have B LE's move in helicopter method. Pt wanted two people to assist with moving B LE's however, anticipate pt only needed x2 individuals. Pt unable to maintain seated balance without MaxA posteriorly.    Transfers    General transfer comment: NT this session     Balance Overall balance assessment: Needs assistance, Mild deficits observed, not formally tested, History of Falls Sitting-balance support: Bilateral upper extremity supported, Feet supported Sitting balance-Leahy Scale: Zero Sitting balance - Comments: unable to maintain seated balance w/o MaxA posteriorly Postural control: Posterior lean         Communication Communication Communication: No apparent difficulties  Cognition Arousal: Alert Behavior During Therapy: Anxious   PT - Cognitive impairments: No family/caregiver present to determine baseline, Memory, Awareness, Problem solving, Safety/Judgement    PT - Cognition Comments: requires increased time and frequent cues for step by step mobility. Very fearful of falling Following commands: Impaired Following commands impaired: Follows one step commands with increased time    Cueing Cueing Techniques: Verbal cues, Tactile cues  Exercises Other Exercises Other Exercises: seated balance for ~10 to 15 minutes        Pertinent Vitals/Pain Pain Assessment Pain Assessment: Faces Faces Pain Scale: Hurts even more Pain Location: B LE's with movement Pain Descriptors / Indicators: Discomfort, Guarding Pain Intervention(s): Limited activity within patient's tolerance, Monitored during session, Repositioned     PT Goals (current goals can now be found in the care plan section) Acute Rehab  PT Goals PT Goal Formulation: With  patient Time For Goal Achievement: 04/02/23 Potential to Achieve Goals: Fair Progress towards PT goals: Progressing toward goals    Frequency    Min 1X/week           Co-evaluation   Reason for Co-Treatment: Complexity of the patient's impairments (multi-system involvement);Necessary to address cognition/behavior during functional activity;For patient/therapist safety;To address functional/ADL transfers PT goals addressed during session: Mobility/safety with mobility;Strengthening/ROM OT goals addressed during session: ADL's and self-care;Strengthening/ROM      AM-PAC PT "6 Clicks" Mobility   Outcome Measure  Help needed turning from your back to your side while in a flat bed without using bedrails?: Total Help needed moving from lying on your back to sitting on the side of a flat bed without using bedrails?: Total Help needed moving to and from a bed to a chair (including a wheelchair)?: Total Help needed standing up from a chair using your arms (e.g., wheelchair or bedside chair)?: Total Help needed to walk in hospital room?: Total Help needed climbing 3-5 steps with a railing? : Total 6 Click Score: 6    End of Session   Activity Tolerance: Patient tolerated treatment well Patient left: in bed;with call bell/phone within reach Nurse Communication: Mobility status PT Visit Diagnosis: Muscle weakness (generalized) (M62.81);Difficulty in walking, not elsewhere classified (R26.2);Other symptoms and signs involving the nervous system (R29.898)     Time: 0865-7846 PT Time Calculation (min) (ACUTE ONLY): 30 min  Charges:    $Therapeutic Activity: 8-22 mins PT General Charges $$ ACUTE PT VISIT: 1 Visit                     Hilton Cork, PT, DPT Secure Chat Preferred  Rehab Office [phone]   Arturo Morton Brion Aliment 03/22/2023, 12:43 PM

## 2023-03-22 NOTE — Plan of Care (Signed)

## 2023-03-22 NOTE — Progress Notes (Signed)
 Occupational Therapy Treatment Patient Details Name: Shelby Wiley MRN: [id number] DOB: [birth year] Today's Date: 03/22/2023   History of present illness 88 y.o. female comes in 03/18/23 for generalized weakness and confusion. EMS report she was last seen by an individual about 4 days PTA and neighbor called EMS. Found covered in stool and urine. +RLE cellulitis, AKI, hypokalemia, afib with RVR  PMH chronic leg wounds, mild lymphedema, essential hypertension   OT comments  Patient received in supine and agreeable to therapy but stating she was fearful of falling. Patient was total assist +2 with helicopter method to get to EOB and required assistance to scoot to EOB and for sitting balance due to posterior leaning and fear of falling. Patient declined washing face seated on EOB, possible due to wanting to use hands for balance. Patient was assisted back to supine with +2 assist and performed grooming and gown change. Patient stating she was glad she worked with therapy and feels more confident. Patient will benefit from continued inpatient follow up therapy, <3 hours/day. Acute OT to continue to follow to address established goals to facilitate DC to next venue of care.        If plan is discharge home, recommend the following:  Two people to help with walking and/or transfers;Two people to help with bathing/dressing/bathroom;Assistance with cooking/housework;Assistance with feeding;Direct supervision/assist for medications management;Direct supervision/assist for financial management;Assist for transportation;Help with stairs or ramp for entrance;Supervision due to cognitive status   Equipment Recommendations  Other (comment) (defer to next venue of care)    Recommendations for Other Services      Precautions / Restrictions Precautions Precautions: Fall Recall of Precautions/Restrictions: Impaired Restrictions Weight Bearing Restrictions Per Provider Order: No       Mobility Bed  Mobility Overal bed mobility: Needs Assistance Bed Mobility: Supine to Sit, Sit to Supine     Supine to sit: Total assist, +2 for physical assistance, +2 for safety/equipment, HOB elevated Sit to supine: Total assist, +2 for physical assistance, +2 for safety/equipment   General bed mobility comments: pt unable to assist with bed mobility, prefers to have B LE's move in helicopter method. Pt wanted two people to assist with moving B LE's however, anticipate pt only needed x2 individuals. Pt unable to maintain seated balance without MaxA posteriorly.    Transfers                   General transfer comment: NT this session     Balance Overall balance assessment: Needs assistance, Mild deficits observed, not formally tested, History of Falls Sitting-balance support: Bilateral upper extremity supported, Feet supported Sitting balance-Leahy Scale: Zero Sitting balance - Comments: unable to maintain seated balance w/o MaxA posteriorly Postural control: Posterior lean                                 ADL either performed or assessed with clinical judgement   ADL Overall ADL's : Needs assistance/impaired     Grooming: Wash/dry hands;Wash/dry face;Set up;Bed level Grooming Details (indicate cue type and reason): patient declined grooming tasks seated on EOB and asked to wash face and hands once back in supine         Upper Body Dressing : Moderate assistance;Bed level Upper Body Dressing Details (indicate cue type and reason): changed gown                   General ADL Comments: increased cooperation  with self care but limited due to pain and fear of falling when on EOB    Extremity/Trunk Assessment              Vision       Perception     Praxis     Communication Communication Communication: No apparent difficulties   Cognition Arousal: Alert Behavior During Therapy: Anxious Cognition: No family/caregiver present to determine baseline,  Cognition impaired     Awareness: Online awareness impaired, Intellectual awareness intact Memory impairment (select all impairments): Short-term memory, Working memory Attention impairment (select first level of impairment): Sustained attention Executive functioning impairment (select all impairments): Initiation, Sequencing, Reasoning, Problem solving OT - Cognition Comments: patient demonstrating fear of falling, asking therapist, "please don't let me fall"                 Following commands: Impaired Following commands impaired: Follows one step commands with increased time      Cueing   Cueing Techniques: Verbal cues, Tactile cues  Exercises      Shoulder Instructions       General Comments Patient demonstrates fear of falling while on EOB    Pertinent Vitals/ Pain       Pain Assessment Pain Assessment: Faces Faces Pain Scale: Hurts even more Pain Location: B LE's with movement Pain Descriptors / Indicators: Discomfort, Guarding Pain Intervention(s): Limited activity within patient's tolerance, Monitored during session, Repositioned  Home Living                                          Prior Functioning/Environment              Frequency  Min 2X/week        Progress Toward Goals  OT Goals(current goals can now be found in the care plan section)  Progress towards OT goals: Progressing toward goals  Acute Rehab OT Goals Patient Stated Goal: go SNF so she can get strong enough to go home OT Goal Formulation: With patient Time For Goal Achievement: 04/02/23 Potential to Achieve Goals: Fair ADL Goals Pt Will Perform Grooming: with set-up;sitting Pt Will Perform Upper Body Dressing: with mod assist;sitting Pt Will Transfer to Toilet: with max assist;with +2 assist;squat pivot transfer;bedside commode Pt Will Perform Toileting - Clothing Manipulation and hygiene: with max assist;sit to/from stand Additional ADL Goal #1: Pt will  perform bed mobility at mod A +2 as precursor to participation in ADL  Plan      Co-evaluation    PT/OT/SLP Co-Evaluation/Treatment: Yes Reason for Co-Treatment: Complexity of the patient's impairments (multi-system involvement);Necessary to address cognition/behavior during functional activity;For patient/therapist safety;To address functional/ADL transfers PT goals addressed during session: Mobility/safety with mobility;Strengthening/ROM OT goals addressed during session: ADL's and self-care;Strengthening/ROM      AM-PAC OT "6 Clicks" Daily Activity     Outcome Measure   Help from another person eating meals?: A Lot Help from another person taking care of personal grooming?: A Little Help from another person toileting, which includes using toliet, bedpan, or urinal?: Total Help from another person bathing (including washing, rinsing, drying)?: Total Help from another person to put on and taking off regular upper body clothing?: Total Help from another person to put on and taking off regular lower body clothing?: Total 6 Click Score: 9    End of Session    OT Visit Diagnosis: History of falling (Z91.81);Muscle weakness (  generalized) (M62.81);Other symptoms and signs involving cognitive function   Activity Tolerance Patient tolerated treatment well   Patient Left in bed;with call bell/phone within reach;with bed alarm set   Nurse Communication Mobility status;Need for lift equipment;Precautions        Time: 1478-2956 OT Time Calculation (min): 29 min  Charges: OT General Charges $OT Visit: 1 Visit OT Treatments $Therapeutic Activity: 8-22 mins  Alfonse Flavors, OTA Acute Rehabilitation Services  Office [phone]   Dewain Penning 03/22/2023, 1:17 PM

## 2023-03-22 NOTE — Plan of Care (Signed)
  Problem: Education: Goal: Knowledge of General Education information will improve Description: Including pain rating scale, medication(s)/side effects and non-pharmacologic comfort measures 03/22/2023 0109 by Natasha Mead, RN Outcome: Progressing 03/22/2023 0049 by Natasha Mead, RN Outcome: Progressing   Problem: Health Behavior/Discharge Planning: Goal: Ability to manage health-related needs will improve 03/22/2023 0109 by Natasha Mead, RN Outcome: Progressing 03/22/2023 0049 by Natasha Mead, RN Outcome: Progressing   Problem: Clinical Measurements: Goal: Ability to maintain clinical measurements within normal limits will improve 03/22/2023 0109 by Natasha Mead, RN Outcome: Progressing 03/22/2023 0049 by Natasha Mead, RN Outcome: Progressing Goal: Will remain free from infection 03/22/2023 0109 by Natasha Mead, RN Outcome: Progressing 03/22/2023 0049 by Natasha Mead, RN Outcome: Progressing Goal: Diagnostic test results will improve 03/22/2023 0109 by Natasha Mead, RN Outcome: Progressing 03/22/2023 0049 by Natasha Mead, RN Outcome: Progressing Goal: Respiratory complications will improve 03/22/2023 0109 by Natasha Mead, RN Outcome: Progressing 03/22/2023 0049 by Natasha Mead, RN Outcome: Progressing Goal: Cardiovascular complication will be avoided 03/22/2023 0109 by Natasha Mead, RN Outcome: Progressing 03/22/2023 0049 by Natasha Mead, RN Outcome: Progressing

## 2023-03-22 NOTE — Progress Notes (Signed)
 PROGRESS NOTE  Shelby Wiley ZOX:[id number] DOB: Feb 27, 1935 DOA: 03/18/2023 PCP: Soundra Pilon, FNP   LOS: 4 days   Brief Narrative / Interim history: 88 y.o. female past medical history of chronic leg wounds, mild lymphedema comes in for left lower extremity cellulitis.  One of her neighbors called EMS as they have not seen her in 4 days, EMS found to sitting in the toilet.  She was found to be bacteremic with Proteus, as well as hypotensive requiring midodrine addition  Subjective / 24h Interval events: Feeling well, stronger.  She is eating breakfast.  Assesement and Plan: Principal Problem:   Cellulitis of left lower extremity Active Problems:   Hypokalemia   Lymphedema   A-fib (HCC)   OBESITY, MORBID   AKI (acute kidney injury) (HCC)   Cellulitis of right lower extremity   Principal problem Sepsis secondary to Proteus bacteremia - Blood culture ID panel was positive for Proteus. Blood culture grew Proteus Hauseri, source not entirely clear, query skin source. -Due to hypotension Coreg was held, she was started on IV fluid and midodrine.  Overall blood pressure improved now, however still on the soft side early this morning -PT recommends SNF, not yet ready but may be within the next 1 to 2 days   Active problems Left lower extremity cellulitis-improving with antibiotics  Acute kidney injury -creatinine normalized with IV fluids as well as midodrine  Hypokalemia -Continue to replace and monitor   Possible fracture of the distal aspect of the proximal phalanges of the fifth toe- Conservative management.  No evidence of osteomyelitis.   Normocytic anemia - She relates no signs of overt bleeding.   Bilateral lymphedema - Consult wound care   Paroxysmal A-fib with RVR - Initially rate controlled Coreg initially, however due to hypotension Coreg has been held.  She had to be added midodrine, now blood pressure is better but still tacky at times.  Add low-dose metoprolol    OBESITY, MORBID - Noted  Scheduled Meds:  apixaban  5 mg Oral BID   Chlorhexidine Gluconate Cloth  6 each Topical Daily   feeding supplement  237 mL Oral BID BM   midodrine  10 mg Oral TID WC   Continuous Infusions:  cefTRIAXone (ROCEPHIN)  IV 2 g (03/22/23 0856)   PRN Meds:.acetaminophen **OR** acetaminophen, ondansetron **OR** ondansetron (ZOFRAN) IV, polyethylene glycol  Current Outpatient Medications  Medication Instructions   apixaban (ELIQUIS) 5 mg, Oral, 2 times daily   ascorbic acid (VITAMIN C) 500 mg, Oral, Daily   carvedilol (COREG) 3.125 mg, Oral, 2 times daily with meals   furosemide (LASIX) 40 mg, Oral, Daily   nutrition supplement, JUVEN, (JUVEN) PACK 1 packet, Oral, 2 times daily between meals   potassium chloride SA (KLOR-CON M) 20 MEQ tablet 20 mEq, Oral, Daily   zinc sulfate (50mg  elemental zinc) 220 mg, Oral, Daily    Diet Orders (From admission, onward)     Start     Ordered   03/19/23 0907  Diet regular Room service appropriate? No; Fluid consistency: Thin  Diet effective now       Question Answer Comment  Room service appropriate? No   Fluid consistency: Thin      03/19/23 0907            DVT prophylaxis:  apixaban (ELIQUIS) tablet 5 mg   Lab Results  Component Value Date   PLT 185 03/19/2023      Code Status: Full Code  Family Communication: No family at  bedside  Status is: Inpatient Remains inpatient appropriate because: Severity of illness   Level of care: Progressive  Consultants:  None  Objective: Vitals:   03/21/23 2007 03/21/23 2335 03/22/23 0300 03/22/23 0847  BP: 111/62 (!) 100/58 (!) 111/54 (!) 105/54  Pulse: 84 89 87 (!) 104  Resp: 19 20 20 20   Temp: 97.9 F (36.6 C) 98 F (36.7 C) 97.8 F (36.6 C) 98.2 F (36.8 C)  TempSrc: Oral Oral Oral Oral  SpO2:    97%    Intake/Output Summary (Last 24 hours) at 03/22/2023 0957 Last data filed at 03/22/2023 0857 Gross per 24 hour  Intake 650 ml  Output 650 ml  Net  0 ml   Wt Readings from Last 3 Encounters:  09/28/22 121.6 kg  07/13/11 127 kg  04/17/11 130.8 kg    Examination:  Constitutional: NAD Eyes: no scleral icterus ENMT: Mucous membranes are moist.  Neck: normal, supple Respiratory: clear to auscultation bilaterally, no wheezing, no crackles. Normal respiratory effort. No accessory muscle use.  Cardiovascular: Regular rate and rhythm, no murmurs / rubs / gallops.  Lower extremities Ace wrapped Abdomen: non distended, no tenderness. Bowel sounds positive.  Musculoskeletal: no clubbing / cyanosis.   Data Reviewed: I have independently reviewed following labs and imaging studies   CBC Recent Labs  Lab 03/18/23 1030 03/19/23 0344  WBC 12.1* 9.7  HGB 14.8 10.8*  HCT 43.6 32.5*  PLT 212 185  MCV 97.3 97.3  MCH 33.0 32.3  MCHC 33.9 33.2  RDW 13.2 13.4  LYMPHSABS 1.0  --   MONOABS 0.9  --   EOSABS 0.0  --   BASOSABS 0.0  --     Recent Labs  Lab 03/18/23 1030 03/18/23 1044 03/18/23 1301 03/19/23 0344 03/20/23 0749 03/21/23 0307 03/22/23 0408  NA 139  --   --  137 140 137 134*  K 3.4*  --   --  3.1* 4.4 4.2 4.3  CL 102  --   --  106 106 104 105  CO2 21*  --   --  23 25 27 25   GLUCOSE 94  --   --  131* 104* 113* 110*  BUN 30*  --   --  30* 29* 27* 21  CREATININE 1.05*  --   --  1.11* 0.75 0.74 0.77  CALCIUM 8.7*  --   --  7.5* 7.7* 7.9* 7.7*  AST 69*  --   --  38  --   --   --   ALT 38  --   --  27  --   --   --   ALKPHOS 49  --   --  36*  --   --   --   BILITOT 1.2  --   --  0.7  --   --   --   ALBUMIN 2.4*  --   --  1.7*  --   --   --   LATICACIDVEN  --  2.1* 1.7  --   --   --   --     ------------------------------------------------------------------------------------------------------------------ No results for input(s): "CHOL", "HDL", "LDLCALC", "TRIG", "CHOLHDL", "LDLDIRECT" in the last 72 hours.  Lab Results  Component Value Date   HGBA1C 5.2 09/28/2022    ------------------------------------------------------------------------------------------------------------------ No results for input(s): "TSH", "T4TOTAL", "T3FREE", "THYROIDAB" in the last 72 hours.  Invalid input(s): "FREET3"  Cardiac Enzymes No results for input(s): "CKMB", "TROPONINI", "MYOGLOBIN" in the last 168 hours.  Invalid input(s): "CK" ------------------------------------------------------------------------------------------------------------------ No  results found for: "BNP"  CBG: No results for input(s): "GLUCAP" in the last 168 hours.  Recent Results (from the past 240 hours)  Resp panel by RT-PCR (RSV, Flu A&B, Covid) Anterior Nasal Swab     Status: None   Collection Time: 03/18/23 10:16 AM   Specimen: Anterior Nasal Swab  Result Value Ref Range Status   SARS Coronavirus 2 by RT PCR NEGATIVE NEGATIVE Final   Influenza A by PCR NEGATIVE NEGATIVE Final   Influenza B by PCR NEGATIVE NEGATIVE Final    Comment: (NOTE) The Xpert Xpress SARS-CoV-2/FLU/RSV plus assay is intended as an aid in the diagnosis of influenza from Nasopharyngeal swab specimens and should not be used as a sole basis for treatment. Nasal washings and aspirates are unacceptable for Xpert Xpress SARS-CoV-2/FLU/RSV testing.  Fact Sheet for Patients: BloggerCourse.com  Fact Sheet for Healthcare Providers: SeriousBroker.it  This test is not yet approved or cleared by the [place] FDA and has been authorized for detection and/or diagnosis of SARS-CoV-2 by FDA under an Emergency Use Authorization (EUA). This EUA will remain in effect (meaning this test can be used) for the duration of the COVID-19 declaration under Section 564(b)(1) of the Act, 21 U.S.C. section 360bbb-3(b)(1), unless the authorization is terminated or revoked.     Resp Syncytial Virus by PCR NEGATIVE NEGATIVE Final    Comment: (NOTE) Fact Sheet for  Patients: BloggerCourse.com  Fact Sheet for Healthcare Providers: SeriousBroker.it  This test is not yet approved or cleared by the [place] FDA and has been authorized for detection and/or diagnosis of SARS-CoV-2 by FDA under an Emergency Use Authorization (EUA). This EUA will remain in effect (meaning this test can be used) for the duration of the COVID-19 declaration under Section 564(b)(1) of the Act, 21 U.S.C. section 360bbb-3(b)(1), unless the authorization is terminated or revoked.  Performed at [facility], [address] [place], [place] [address]   Blood Culture (routine x 2)     Status: Abnormal   Collection Time: 03/18/23 10:16 AM   Specimen: BLOOD  Result Value Ref Range Status   Specimen Description BLOOD LEFT ANTECUBITAL  Final   Special Requests   Final    BOTTLES DRAWN AEROBIC AND ANAEROBIC Blood Culture results may not be optimal due to an inadequate volume of blood received in culture bottles   Culture  Setup Time   Final    GRAM NEGATIVE RODS AEROBIC BOTTLE ONLY CRITICAL RESULT CALLED TO, READ BACK BY AND VERIFIED WITH: V BRYK,PHARMD@0410  03/19/23 MK Performed at [facility], [address], [place], [place] [address]    Culture PROTEUS HAUSERI (A)  Final   Report Status 03/21/2023 FINAL  Final   Organism ID, Bacteria PROTEUS HAUSERI  Final   Organism ID, Bacteria PROTEUS HAUSERI  Final      Susceptibility   Proteus hauseri - KIRBY BAUER*    CEFAZOLIN RESISTANT Resistant    Proteus hauseri - MIC*    AMPICILLIN >=32 RESISTANT Resistant     CEFEPIME <=0.12 SENSITIVE Sensitive     CEFTAZIDIME <=1 SENSITIVE Sensitive     CEFTRIAXONE <=0.25 SENSITIVE Sensitive     CIPROFLOXACIN <=0.25 SENSITIVE Sensitive     GENTAMICIN <=1 SENSITIVE Sensitive     IMIPENEM 2 SENSITIVE Sensitive     TRIMETH/SULFA <=20 SENSITIVE Sensitive     AMPICILLIN/SULBACTAM 16 INTERMEDIATE Intermediate     PIP/TAZO  <=4 SENSITIVE Sensitive ug/mL    * PROTEUS HAUSERI    PROTEUS HAUSERI  Blood Culture ID Panel (Reflexed)     Status: Abnormal   Collection Time: 03/18/23 10:16 AM  Result Value Ref Range Status   Enterococcus faecalis NOT DETECTED NOT DETECTED Final   Enterococcus Faecium NOT DETECTED NOT DETECTED Final   Listeria monocytogenes NOT DETECTED NOT DETECTED Final   Staphylococcus species NOT DETECTED NOT DETECTED Final   Staphylococcus aureus (BCID) NOT DETECTED NOT DETECTED Final   Staphylococcus epidermidis NOT DETECTED NOT DETECTED Final   Staphylococcus lugdunensis NOT DETECTED NOT DETECTED Final   Streptococcus species NOT DETECTED NOT DETECTED Final   Streptococcus agalactiae NOT DETECTED NOT DETECTED Final   Streptococcus pneumoniae NOT DETECTED NOT DETECTED Final   Streptococcus pyogenes NOT DETECTED NOT DETECTED Final   A.calcoaceticus-baumannii NOT DETECTED NOT DETECTED Final   Bacteroides fragilis NOT DETECTED NOT DETECTED Final   Enterobacterales DETECTED (A) NOT DETECTED Final    Comment: Enterobacterales represent a large order of gram negative bacteria, not a single organism. CRITICAL RESULT CALLED TO, READ BACK BY AND VERIFIED WITH: V BRYK,PHARMD@0410  03/19/23 MK    Enterobacter cloacae complex NOT DETECTED NOT DETECTED Final   Escherichia coli NOT DETECTED NOT DETECTED Final   Klebsiella aerogenes NOT DETECTED NOT DETECTED Final   Klebsiella oxytoca NOT DETECTED NOT DETECTED Final   Klebsiella pneumoniae NOT DETECTED NOT DETECTED Final   Proteus species DETECTED (A) NOT DETECTED Final    Comment: CRITICAL RESULT CALLED TO, READ BACK BY AND VERIFIED WITH: V BRYK,PHARMD@0410  03/19/23 MK    Salmonella species NOT DETECTED NOT DETECTED Final   Serratia marcescens NOT DETECTED NOT DETECTED Final   Haemophilus influenzae NOT DETECTED NOT DETECTED Final   Neisseria meningitidis NOT DETECTED NOT DETECTED Final   Pseudomonas aeruginosa NOT DETECTED NOT DETECTED Final    Stenotrophomonas maltophilia NOT DETECTED NOT DETECTED Final   Candida albicans NOT DETECTED NOT DETECTED Final   Candida auris NOT DETECTED NOT DETECTED Final   Candida glabrata NOT DETECTED NOT DETECTED Final   Candida krusei NOT DETECTED NOT DETECTED Final   Candida parapsilosis NOT DETECTED NOT DETECTED Final   Candida tropicalis NOT DETECTED NOT DETECTED Final   Cryptococcus neoformans/gattii NOT DETECTED NOT DETECTED Final   CTX-M ESBL NOT DETECTED NOT DETECTED Final   Carbapenem resistance IMP NOT DETECTED NOT DETECTED Final   Carbapenem resistance KPC NOT DETECTED NOT DETECTED Final   Carbapenem resistance NDM NOT DETECTED NOT DETECTED Final   Carbapenem resist OXA 48 LIKE NOT DETECTED NOT DETECTED Final   Carbapenem resistance VIM NOT DETECTED NOT DETECTED Final    Comment: Performed at [facility], [address], [place], [place] [address]  Blood Culture (routine x 2)     Status: None (Preliminary result)   Collection Time: 03/18/23 10:21 AM   Specimen: BLOOD RIGHT HAND  Result Value Ref Range Status   Specimen Description BLOOD RIGHT HAND  Final   Special Requests   Final    AEROBIC BOTTLE ONLY Blood Culture results may not be optimal due to an inadequate volume of blood received in culture bottles   Culture   Final    NO GROWTH 4 DAYS Performed at [facility], [address] [place], [place] [address]    Report Status PENDING  Incomplete  Urine Culture     Status: None   Collection Time: 03/18/23 11:00 PM   Specimen: Urine, Random  Result Value Ref Range Status   Specimen Description URINE, RANDOM  Final   Special Requests NONE Reflexed from  [id number]  Final   Culture   Final    NO GROWTH Performed at [facility], [address] [place], [place] [address]    Report Status 03/19/2023 FINAL  Final     Radiology Studies: No results found.   Pamella Pert, MD, PhD [facility]  Between 7 am - 7 pm I am available, please contact me via  Amion (for emergencies) or Securechat (non urgent messages)  Between 7 pm - 7 am I am not available, please contact night coverage MD/APP via Amion

## 2023-03-22 NOTE — Plan of Care (Signed)

## 2023-03-22 NOTE — TOC Progression Note (Signed)
 Transition of Care [facility]) - Progression Note    Patient Details  Name: Shelby Wiley MRN: [id number] Date of Birth: [birth year]  [facility] CM/SW Contact  Eduard Roux, [place] Phone Number: 03/22/2023, 4:51 PM  Clinical Narrative:     Provided patient with SNF bed offers- TOC will follow up tomorrow for choice.   Antony Blackbird, MSW, LCSW Clinical Social Worker    Expected Discharge Plan: Skilled Nursing Facility Barriers to Discharge: Continued Medical Work up  Expected Discharge Plan and Services In-house Referral: Clinical Social Work     Living arrangements for the past 2 months: Single Family Home                                       Social Determinants of Health (SDOH) Interventions SDOH Screenings   Food Insecurity: No Food Insecurity (03/18/2023)  Housing: Low Risk  (03/18/2023)  Transportation Needs: Unmet Transportation Needs (03/18/2023)  Utilities: Not At Risk (03/18/2023)  Social Connections: Moderately Isolated (03/18/2023)  Tobacco Use: Medium Risk (03/18/2023)    Readmission Risk Interventions    09/29/2022    4:02 PM  Readmission Risk Prevention Plan  Post Dischage Appt Complete  Medication Screening Complete  Transportation Screening Complete

## 2023-03-23 DIAGNOSIS — L03116 Cellulitis of left lower limb: Secondary | ICD-10-CM | POA: Diagnosis not present

## 2023-03-23 LAB — CBC
HCT: 33.3 % — ABNORMAL LOW (ref 36.0–46.0)
Hemoglobin: 10.9 g/dL — ABNORMAL LOW (ref 12.0–15.0)
MCH: 32.3 pg (ref 26.0–34.0)
MCHC: 32.7 g/dL (ref 30.0–36.0)
MCV: 98.8 fL (ref 80.0–100.0)
Platelets: 205 10*3/uL (ref 150–400)
RBC: 3.37 MIL/uL — ABNORMAL LOW (ref 3.87–5.11)
RDW: 13.7 % (ref 11.5–15.5)
WBC: 12.8 10*3/uL — ABNORMAL HIGH (ref 4.0–10.5)
nRBC: 0 % (ref 0.0–0.2)

## 2023-03-23 LAB — COMPREHENSIVE METABOLIC PANEL
ALT: 41 U/L (ref 0–44)
AST: 56 U/L — ABNORMAL HIGH (ref 15–41)
Albumin: 1.8 g/dL — ABNORMAL LOW (ref 3.5–5.0)
Alkaline Phosphatase: 35 U/L — ABNORMAL LOW (ref 38–126)
Anion gap: 5 (ref 5–15)
BUN: 16 mg/dL (ref 8–23)
CO2: 27 mmol/L (ref 22–32)
Calcium: 7.6 mg/dL — ABNORMAL LOW (ref 8.9–10.3)
Chloride: 102 mmol/L (ref 98–111)
Creatinine, Ser: 0.68 mg/dL (ref 0.44–1.00)
GFR, Estimated: >60 mL/min (ref >60)
Glucose, Bld: 123 mg/dL — ABNORMAL HIGH (ref 70–99)
Potassium: 4.1 mmol/L (ref 3.5–5.1)
Sodium: 134 mmol/L — ABNORMAL LOW (ref 135–145)
Total Bilirubin: 0.4 mg/dL (ref 0.0–1.2)
Total Protein: 4.9 g/dL — ABNORMAL LOW (ref 6.5–8.1)

## 2023-03-23 LAB — CULTURE, BLOOD (ROUTINE X 2): Culture: NO GROWTH

## 2023-03-23 LAB — MAGNESIUM: Magnesium: 1.9 mg/dL (ref 1.7–2.4)

## 2023-03-23 LAB — PHOSPHORUS: Phosphorus: 2.7 mg/dL (ref 2.5–4.6)

## 2023-03-23 NOTE — Progress Notes (Signed)
 PROGRESS NOTE  Shelby Wiley WUX:[id number] DOB: September 11, 1935 DOA: 03/18/2023 PCP: Soundra Pilon, FNP   LOS: 5 days   Brief Narrative / Interim history: 88 y.o. female past medical history of chronic leg wounds, mild lymphedema comes in for left lower extremity cellulitis.  One of her neighbors called EMS as they have not seen her in 4 days, EMS found to sitting in the toilet.  She was found to be bacteremic with Proteus, as well as hypotensive requiring midodrine addition  Subjective / 24h Interval events: Doing well today, no nausea, no vomiting.  Denies any fever or chills  Assesement and Plan: Principal Problem:   Cellulitis of left lower extremity Active Problems:   Hypokalemia   Lymphedema   A-fib (HCC)   OBESITY, MORBID   AKI (acute kidney injury) (HCC)   Cellulitis of right lower extremity   Principal problem Sepsis secondary to Proteus bacteremia - Blood culture ID panel was positive for Proteus. Blood culture grew Proteus Hauseri, source not entirely clear, query skin source. -Due to hypotension Coreg was held, she was started on IV fluid and midodrine.  Overall blood pressure improved now, however still on the soft side early this morning -PT recommends SNF, not yet ready but probably within the next 24 hours   Active problems Left lower extremity cellulitis-improving with antibiotics, legs evaluated today, no further cellulitis but chronic findings  Acute kidney injury -creatinine normalized with IV fluids as well as midodrine  Hypokalemia -Continue to replace and monitor as indicated, potassium normal at 4.1 today   Possible fracture of the distal aspect of the proximal phalanges of the fifth toe- Conservative management.  No evidence of osteomyelitis.   Normocytic anemia - She relates no signs of overt bleeding.  Hemoglobin stable today   Bilateral lymphedema - Consult wound care   Paroxysmal A-fib with RVR - Initially rate controlled Coreg initially, however  due to hypotension Coreg has been held.  Has been switched to metoprolol as it does not drop the blood pressure as much as Coreg   OBESITY, MORBID - Noted  Wounds care per wound care nurse Wound type: deep pressure injury on sacrum and buttocks. BLE with full thickness, venous insufficiency. Pressure Injury POA: Yes   Measurement: Right leg upper area - trauma for the last wrap (pt was using unna boot prior admitted) 3 cm x 0.5 cm Wound bed: 100% red 2 - wound on the knee - 1.5 x 1.5 cm, cover by black crust, partial debrided. Wound bed: 60% yellow, 40% black scab.   Measurement: Left leg middle area - after removed part of the scabs, 7 cm x 3 cm Wound bed: 100% red, dermis exposion. Ankle pressure injury - 3 cm x 5 cm, cover by black crust, partial debrided. Wound bed: 60% yellow, 40% black scab. Drainage (amount, consistency, odor) minimum amount  Periwound: cover by scabs, edema 2+/4+   Dressing procedure/placement/frequency: Clean both legs with warm water and wash cloth. Pat dry, clean the wounds with Vashe [id number]; Apply Aquacel [id number] on the wound bed (Right leg and left ankle).  Left leg: apply one layer of Xeroform, suspended when healed. Both legs: Wrap with Kerlix and abd wrap. Beginning on the base of the toes, until the knees. Apply a light compression through the leg. Use Prevalon boot on left side.   WOC Nurse Consult Note: Reason for Consult: Sacrum/buttock wounds. Wound type: extensive deep pressure injury on sacrum and buttocks. Wound bed: 100% purple skin. Blister on  the right side of the buttock, not ruptured. Drainage (amount, consistency, odor) minimum amount  Periwound: intact   Dressing procedure/placement/frequency: Apply foam dressing, change every 3 days or PRN.  Scheduled Meds:  apixaban  5 mg Oral BID   Chlorhexidine Gluconate Cloth  6 each Topical Daily   feeding supplement  237 mL Oral BID BM   metoprolol tartrate  12.5 mg Oral BID   midodrine   10 mg Oral TID WC   Continuous Infusions:  cefTRIAXone (ROCEPHIN)  IV 2 g (03/23/23 0824)   PRN Meds:.acetaminophen **OR** acetaminophen, ondansetron **OR** ondansetron (ZOFRAN) IV, polyethylene glycol  Current Outpatient Medications  Medication Instructions   apixaban (ELIQUIS) 5 mg, Oral, 2 times daily   ascorbic acid (VITAMIN C) 500 mg, Oral, Daily   carvedilol (COREG) 3.125 mg, Oral, 2 times daily with meals   furosemide (LASIX) 40 mg, Oral, Daily   nutrition supplement, JUVEN, (JUVEN) PACK 1 packet, Oral, 2 times daily between meals   potassium chloride SA (KLOR-CON M) 20 MEQ tablet 20 mEq, Oral, Daily   zinc sulfate (50mg  elemental zinc) 220 mg, Oral, Daily    Diet Orders (From admission, onward)     Start     Ordered   03/19/23 0907  Diet regular Room service appropriate? No; Fluid consistency: Thin  Diet effective now       Question Answer Comment  Room service appropriate? No   Fluid consistency: Thin      03/19/23 0907            DVT prophylaxis:  apixaban (ELIQUIS) tablet 5 mg   Lab Results  Component Value Date   PLT 205 03/23/2023      Code Status: Full Code  Family Communication: No family at bedside  Status is: Inpatient Remains inpatient appropriate because: Severity of illness   Level of care: Progressive  Consultants:  None  Objective: Vitals:   03/23/23 0600 03/23/23 0700 03/23/23 0900 03/23/23 0940  BP: (!) 99/55 (!) 100/55 (!) 100/49   Pulse: 88 96 88 89  Resp: 20 20 20 18   Temp:   98.7 F (37.1 C)   TempSrc:   Oral   SpO2: 95% 97% 94%     Intake/Output Summary (Last 24 hours) at 03/23/2023 1107 Last data filed at 03/23/2023 1048 Gross per 24 hour  Intake 600 ml  Output 1675 ml  Net -1075 ml   Wt Readings from Last 3 Encounters:  09/28/22 121.6 kg  07/13/11 127 kg  04/17/11 130.8 kg    Examination:  Constitutional: NAD Eyes: lids and conjunctivae normal, no scleral icterus ENMT: mmm Neck: normal,  supple Respiratory: clear to auscultation bilaterally, no wheezing, no crackles.  Cardiovascular: Regular rate and rhythm, no murmurs / rubs / gallops.  Chronic venous stasis changes bilateral lower extremities Abdomen: soft, no distention, no tenderness. Bowel sounds positive.   Data Reviewed: I have independently reviewed following labs and imaging studies   CBC Recent Labs  Lab 03/18/23 1030 03/19/23 0344 03/23/23 0329  WBC 12.1* 9.7 12.8*  HGB 14.8 10.8* 10.9*  HCT 43.6 32.5* 33.3*  PLT 212 185 205  MCV 97.3 97.3 98.8  MCH 33.0 32.3 32.3  MCHC 33.9 33.2 32.7  RDW 13.2 13.4 13.7  LYMPHSABS 1.0  --   --   MONOABS 0.9  --   --   EOSABS 0.0  --   --   BASOSABS 0.0  --   --     Recent Labs  Lab 03/18/23  1030 03/18/23 1044 03/18/23 1301 03/19/23 0344 03/20/23 0749 03/21/23 0307 03/22/23 0408 03/23/23 0329  NA 139  --   --  137 140 137 134* 134*  K 3.4*  --   --  3.1* 4.4 4.2 4.3 4.1  CL 102  --   --  106 106 104 105 102  CO2 21*  --   --  23 25 27 25 27   GLUCOSE 94  --   --  131* 104* 113* 110* 123*  BUN 30*  --   --  30* 29* 27* 21 16  CREATININE 1.05*  --   --  1.11* 0.75 0.74 0.77 0.68  CALCIUM 8.7*  --   --  7.5* 7.7* 7.9* 7.7* 7.6*  AST 69*  --   --  38  --   --   --  56*  ALT 38  --   --  27  --   --   --  41  ALKPHOS 49  --   --  36*  --   --   --  35*  BILITOT 1.2  --   --  0.7  --   --   --  0.4  ALBUMIN 2.4*  --   --  1.7*  --   --   --  1.8*  MG  --   --   --   --   --   --   --  1.9  LATICACIDVEN  --  2.1* 1.7  --   --   --   --   --     ------------------------------------------------------------------------------------------------------------------ No results for input(s): "CHOL", "HDL", "LDLCALC", "TRIG", "CHOLHDL", "LDLDIRECT" in the last 72 hours.  Lab Results  Component Value Date   HGBA1C 5.2 09/28/2022   ------------------------------------------------------------------------------------------------------------------ No results for  input(s): "TSH", "T4TOTAL", "T3FREE", "THYROIDAB" in the last 72 hours.  Invalid input(s): "FREET3"  Cardiac Enzymes No results for input(s): "CKMB", "TROPONINI", "MYOGLOBIN" in the last 168 hours.  Invalid input(s): "CK" ------------------------------------------------------------------------------------------------------------------ No results found for: "BNP"  CBG: No results for input(s): "GLUCAP" in the last 168 hours.  Recent Results (from the past 240 hours)  Resp panel by RT-PCR (RSV, Flu A&B, Covid) Anterior Nasal Swab     Status: None   Collection Time: 03/18/23 10:16 AM   Specimen: Anterior Nasal Swab  Result Value Ref Range Status   SARS Coronavirus 2 by RT PCR NEGATIVE NEGATIVE Final   Influenza A by PCR NEGATIVE NEGATIVE Final   Influenza B by PCR NEGATIVE NEGATIVE Final    Comment: (NOTE) The Xpert Xpress SARS-CoV-2/FLU/RSV plus assay is intended as an aid in the diagnosis of influenza from Nasopharyngeal swab specimens and should not be used as a sole basis for treatment. Nasal washings and aspirates are unacceptable for Xpert Xpress SARS-CoV-2/FLU/RSV testing.  Fact Sheet for Patients: BloggerCourse.com  Fact Sheet for Healthcare Providers: SeriousBroker.it  This test is not yet approved or cleared by the [place] FDA and has been authorized for detection and/or diagnosis of SARS-CoV-2 by FDA under an Emergency Use Authorization (EUA). This EUA will remain in effect (meaning this test can be used) for the duration of the COVID-19 declaration under Section 564(b)(1) of the Act, 21 U.S.C. section 360bbb-3(b)(1), unless the authorization is terminated or revoked.     Resp Syncytial Virus by PCR NEGATIVE NEGATIVE Final    Comment: (NOTE) Fact Sheet for Patients: BloggerCourse.com  Fact Sheet for Healthcare Providers: SeriousBroker.it  This test is not  yet  approved or cleared by the [place] and has been authorized for detection and/or diagnosis of SARS-CoV-2 by FDA under an Emergency Use Authorization (EUA). This EUA will remain in effect (meaning this test can be used) for the duration of the COVID-19 declaration under Section 564(b)(1) of the Act, 21 U.S.C. section 360bbb-3(b)(1), unless the authorization is terminated or revoked.  Performed at [facility], [address], [place], [place] [address]   Blood Culture (routine x 2)     Status: Abnormal   Collection Time: 03/18/23 10:16 AM   Specimen: BLOOD  Result Value Ref Range Status   Specimen Description BLOOD LEFT ANTECUBITAL  Final   Special Requests   Final    BOTTLES DRAWN AEROBIC AND ANAEROBIC Blood Culture results may not be optimal due to an inadequate volume of blood received in culture bottles   Culture  Setup Time   Final    GRAM NEGATIVE RODS AEROBIC BOTTLE ONLY CRITICAL RESULT CALLED TO, READ BACK BY AND VERIFIED WITH: V BRYK,PHARMD@0410  03/19/23 MK Performed at [facility], [address], [place], [place] [address]    Culture PROTEUS HAUSERI (A)  Final   Report Status 03/21/2023 FINAL  Final   Organism ID, Bacteria PROTEUS HAUSERI  Final   Organism ID, Bacteria PROTEUS HAUSERI  Final      Susceptibility   Proteus hauseri - KIRBY BAUER*    CEFAZOLIN RESISTANT Resistant    Proteus hauseri - MIC*    AMPICILLIN >=32 RESISTANT Resistant     CEFEPIME <=0.12 SENSITIVE Sensitive     CEFTAZIDIME <=1 SENSITIVE Sensitive     CEFTRIAXONE <=0.25 SENSITIVE Sensitive     CIPROFLOXACIN <=0.25 SENSITIVE Sensitive     GENTAMICIN <=1 SENSITIVE Sensitive     IMIPENEM 2 SENSITIVE Sensitive     TRIMETH/SULFA <=20 SENSITIVE Sensitive     AMPICILLIN/SULBACTAM 16 INTERMEDIATE Intermediate     PIP/TAZO <=4 SENSITIVE Sensitive ug/mL    * PROTEUS HAUSERI    PROTEUS HAUSERI  Blood Culture ID Panel (Reflexed)     Status: Abnormal   Collection Time: 03/18/23  10:16 AM  Result Value Ref Range Status   Enterococcus faecalis NOT DETECTED NOT DETECTED Final   Enterococcus Faecium NOT DETECTED NOT DETECTED Final   Listeria monocytogenes NOT DETECTED NOT DETECTED Final   Staphylococcus species NOT DETECTED NOT DETECTED Final   Staphylococcus aureus (BCID) NOT DETECTED NOT DETECTED Final   Staphylococcus epidermidis NOT DETECTED NOT DETECTED Final   Staphylococcus lugdunensis NOT DETECTED NOT DETECTED Final   Streptococcus species NOT DETECTED NOT DETECTED Final   Streptococcus agalactiae NOT DETECTED NOT DETECTED Final   Streptococcus pneumoniae NOT DETECTED NOT DETECTED Final   Streptococcus pyogenes NOT DETECTED NOT DETECTED Final   A.calcoaceticus-baumannii NOT DETECTED NOT DETECTED Final   Bacteroides fragilis NOT DETECTED NOT DETECTED Final   Enterobacterales DETECTED (A) NOT DETECTED Final    Comment: Enterobacterales represent a large order of gram negative bacteria, not a single organism. CRITICAL RESULT CALLED TO, READ BACK BY AND VERIFIED WITH: V BRYK,PHARMD@0410  03/19/23 MK    Enterobacter cloacae complex NOT DETECTED NOT DETECTED Final   Escherichia coli NOT DETECTED NOT DETECTED Final   Klebsiella aerogenes NOT DETECTED NOT DETECTED Final   Klebsiella oxytoca NOT DETECTED NOT DETECTED Final   Klebsiella pneumoniae NOT DETECTED NOT DETECTED Final   Proteus species DETECTED (A) NOT DETECTED Final    Comment: CRITICAL RESULT CALLED TO, READ BACK BY AND VERIFIED WITH: V BRYK,PHARMD@0410  03/19/23  MK    Salmonella species NOT DETECTED NOT DETECTED Final   Serratia marcescens NOT DETECTED NOT DETECTED Final   Haemophilus influenzae NOT DETECTED NOT DETECTED Final   Neisseria meningitidis NOT DETECTED NOT DETECTED Final   Pseudomonas aeruginosa NOT DETECTED NOT DETECTED Final   Stenotrophomonas maltophilia NOT DETECTED NOT DETECTED Final   Candida albicans NOT DETECTED NOT DETECTED Final   Candida auris NOT DETECTED NOT DETECTED Final    Candida glabrata NOT DETECTED NOT DETECTED Final   Candida krusei NOT DETECTED NOT DETECTED Final   Candida parapsilosis NOT DETECTED NOT DETECTED Final   Candida tropicalis NOT DETECTED NOT DETECTED Final   Cryptococcus neoformans/gattii NOT DETECTED NOT DETECTED Final   CTX-M ESBL NOT DETECTED NOT DETECTED Final   Carbapenem resistance IMP NOT DETECTED NOT DETECTED Final   Carbapenem resistance KPC NOT DETECTED NOT DETECTED Final   Carbapenem resistance NDM NOT DETECTED NOT DETECTED Final   Carbapenem resist OXA 48 LIKE NOT DETECTED NOT DETECTED Final   Carbapenem resistance VIM NOT DETECTED NOT DETECTED Final    Comment: Performed at [facility], [address] [place], [place] [address]  Blood Culture (routine x 2)     Status: None   Collection Time: 03/18/23 10:21 AM   Specimen: BLOOD RIGHT HAND  Result Value Ref Range Status   Specimen Description BLOOD RIGHT HAND  Final   Special Requests   Final    AEROBIC BOTTLE ONLY Blood Culture results may not be optimal due to an inadequate volume of blood received in culture bottles   Culture   Final    NO GROWTH 5 DAYS Performed at [facility], [address] [place], [place] [address]    Report Status 03/23/2023 FINAL  Final  Urine Culture     Status: None   Collection Time: 03/18/23 11:00 PM   Specimen: Urine, Random  Result Value Ref Range Status   Specimen Description URINE, RANDOM  Final   Special Requests NONE Reflexed from [id number]  Final   Culture   Final    NO GROWTH Performed at [facility], [address] [place], [place] [address]    Report Status 03/19/2023 FINAL  Final     Radiology Studies: No results found.   Pamella Pert, MD, PhD [facility]  Between 7 am - 7 pm I am available, please contact me via Amion (for emergencies) or Securechat (non urgent messages)  Between 7 pm - 7 am I am not available, please contact night coverage MD/APP via Amion

## 2023-03-23 NOTE — TOC Progression Note (Signed)
 Transition of Care [facility]) - Progression Note    Patient Details  Name: Shelby Wiley MRN: [id number] Date of Birth: Jun 11, 1935  [facility]) CM/SW Contact  Eduard Roux, [place] Phone Number: 03/23/2023, 12:20 PM  Clinical Narrative:     CSW met with patient at bedside for SNF choice. CSW discussed with patient Medicare.gov star ratings and their location. CSW explained to patient can not give advice on which facility to chose. Patient [facility] on [facility].   [facility] confirmed they can admit patient tomorrow if stable.   TOC will continue to follow and assist with discharge planning.  Antony Blackbird, MSW, LCSW Clinical Social Worker    Expected Discharge Plan: Skilled Nursing Facility Barriers to Discharge: Continued Medical Work up  Expected Discharge Plan and Services In-house Referral: Clinical Social Work     Living arrangements for the past 2 months: Single Family Home                                       Social Determinants of Health (SDOH) Interventions SDOH Screenings   Food Insecurity: No Food Insecurity (03/18/2023)  Housing: Low Risk  (03/18/2023)  Transportation Needs: Unmet Transportation Needs (03/18/2023)  Utilities: Not At Risk (03/18/2023)  Social Connections: Moderately Isolated (03/18/2023)  Tobacco Use: Medium Risk (03/18/2023)    Readmission Risk Interventions    09/29/2022    4:02 PM  Readmission Risk Prevention Plan  Post Dischage Appt Complete  Medication Screening Complete  Transportation Screening Complete

## 2023-03-24 DIAGNOSIS — L03116 Cellulitis of left lower limb: Secondary | ICD-10-CM | POA: Diagnosis not present

## 2023-03-24 MED ORDER — MIDODRINE HCL 10 MG PO TABS: 10.0000 mg | ORAL_TABLET | Freq: Three times a day (TID) | ORAL | Status: AC

## 2023-03-24 MED ORDER — CIPROFLOXACIN HCL 500 MG PO TABS: 500.0000 mg | ORAL_TABLET | Freq: Two times a day (BID) | ORAL | Status: AC

## 2023-03-24 MED ORDER — METOPROLOL TARTRATE 25 MG PO TABS: 12.5000 mg | ORAL_TABLET | Freq: Two times a day (BID) | ORAL | Status: AC

## 2023-03-24 MED ORDER — FUROSEMIDE 40 MG PO TABS: 40.0000 mg | ORAL_TABLET | Freq: Every day | ORAL | Status: AC | PRN

## 2023-03-24 NOTE — TOC Transition Note (Signed)
 Transition of Care [facility]) - Discharge Note   Patient Details  Name: Shelby Wiley MRN: [id number] Date of Birth: [birth year]  Transition of Care [facility]) CM/SW Contact:  Patrice Paradise, LCSW Phone Number: 03/24/2023, 10:04 AM   Clinical Narrative:    Patient will DC to:?[facility] Anticipated DC date:?03/24/2023 Transport by: Sharin Mons   Per MD patient ready for DC to [facility]. RN, patient, patient's family, and facility notified of DC. Discharge Summary sent to facility. RN given number for report [phone]. DC packet on chart. Ambulance transport requested for patient.   CSW signing off.   Judd Lien, [place] [phone]    Final next level of care: Skilled Nursing Facility Barriers to Discharge: Barriers Resolved   Patient Goals and CMS Choice            Discharge Placement              Patient chooses bed at: [facility] Patient to be transferred to facility by: [facility] Name of family member notified: N/A Patient and family notified of of transfer: 03/24/23  Discharge Plan and Services Additional resources added to the After Visit Summary for   In-house Referral: Clinical Social Work                                   Social Drivers of Health (SDOH) Interventions SDOH Screenings   Food Insecurity: No Food Insecurity (03/18/2023)  Housing: Low Risk  (03/18/2023)  Transportation Needs: Unmet Transportation Needs (03/18/2023)  Utilities: Not At Risk (03/18/2023)  Social Connections: Moderately Isolated (03/18/2023)  Tobacco Use: Medium Risk (03/18/2023)     Readmission Risk Interventions    09/29/2022    4:02 PM  Readmission Risk Prevention Plan  Post Dischage Appt Complete  Medication Screening Complete  Transportation Screening Complete

## 2023-03-24 NOTE — Discharge Summary (Signed)
 Physician Discharge Summary  Shelby Wiley ZOX:[id number] DOB: [birth year] DOA: 03/18/2023  PCP: Soundra Pilon, FNP  Admit date: 03/18/2023 Discharge date: 03/24/2023  Admitted From: home Disposition:  SNF  Recommendations for Outpatient Follow-up:  Follow up with PCP in 1-2 weeks Please obtain BMP/CBC in one week Please follow a low-sodium diet  Home Health: none Equipment/Devices: none  Discharge Condition: stable CODE STATUS: Full code  HPI: Per admitting MD, Shelby Wiley is a 88 y.o. female past medical history of chronic leg wounds, mild lymphedema who had an Unna boot about 2 months prior to admission, essential hypertension comes in for generalized weakness and confusion according to ED's and EMS report she was last seen by an individual about 4 days prior to admission one of her friends called EMS and found her in the toilet covered in stool and urine disheveled.  She has had lower extremity pain and erythema.  We believe she had been sitting there for the last 3 to 4 days.  The pain is mostly in her right leg where the bandage was were removed   Hospital Course / Discharge diagnoses: Principal Problem:   Cellulitis of left lower extremity Active Problems:   Hypokalemia   Lymphedema   A-fib (HCC)   OBESITY, MORBID   AKI (acute kidney injury) (HCC)   Cellulitis of right lower extremity   Principal problem Sepsis secondary to Proteus bacteremia - Blood culture ID panel was positive for Proteus. Blood culture grew Proteus Hauseri, source not entirely clear, query skin source. Due to hypotension Coreg was held, she was started on IV fluid and midodrine.  Overall blood pressure improved now.  She was too weak to return home, SNF was recommended, and she will be discharged in stable condition.  Has received ceftriaxone while hospitalized, changed to ciprofloxacin for 4 additional days to complete a 10-day course   Active problems Left lower extremity cellulitis-improving  with antibiotics, legs evaluated prior to discharge on 3/21, no further cellulitis but chronic findings of venous stasis changes.  Continue wound care as below.  Has received ceftriaxone while hospitalized, will change to ciprofloxacin on discharge for 4 additional days Acute kidney injury -creatinine normalized with IV fluids as well as midodrine Hypokalemia -potassium repleted  Possible fracture of the distal aspect of the proximal phalanges of the fifth toe- Conservative management.  No evidence of osteomyelitis. Normocytic anemia - She relates no signs of overt bleeding.  Hemoglobin stable Bilateral lymphedema - Consult wound care Paroxysmal A-fib with RVR - Initially rate controlled Coreg initially, however due to hypotension Coreg has been held.  Has been switched to metoprolol as it does not drop the blood pressure as much as Coreg OBESITY, MORBID - Noted   Wounds care per wound care nurse Wound type: deep pressure injury on sacrum and buttocks. BLE with full thickness, venous insufficiency. Pressure Injury POA: Yes   Measurement: Right leg upper area - trauma for the last wrap (pt was using unna boot prior admitted) 3 cm x 0.5 cm Wound bed: 100% red 2 - wound on the knee - 1.5 x 1.5 cm, cover by black crust, partial debrided. Wound bed: 60% yellow, 40% black scab.   Measurement: Left leg middle area - after removed part of the scabs, 7 cm x 3 cm Wound bed: 100% red, dermis exposion. Ankle pressure injury - 3 cm x 5 cm, cover by black crust, partial debrided. Wound bed: 60% yellow, 40% black scab. Drainage (amount, consistency, odor) minimum  amount  Periwound: cover by scabs, edema 2+/4+   Dressing procedure/placement/frequency: Clean both legs with warm water and wash cloth. Pat dry, clean the wounds with Vashe [id number]; Apply Aquacel [id number] on the wound bed (Right leg and left ankle).  Left leg: apply one layer of Xeroform, suspended when healed. Both legs: Wrap with Kerlix and  abd wrap. Beginning on the base of the toes, until the knees. Apply a light compression through the leg. Use Prevalon boot on left side.   WOC Nurse Consult Note: Reason for Consult: Sacrum/buttock wounds. Wound type: extensive deep pressure injury on sacrum and buttocks. Wound bed: 100% purple skin. Blister on the right side of the buttock, not ruptured. Drainage (amount, consistency, odor) minimum amount  Periwound: intact   Dressing procedure/placement/frequency: Apply foam dressing, change every 3 days or PRN.   Discharge Instructions   Allergies as of 03/24/2023       Reactions   Codeine Nausea And Vomiting, Other (See Comments)   Caused GI upset        Medication List     STOP taking these medications    carvedilol 3.125 MG tablet Commonly known as: COREG       TAKE these medications    apixaban 5 MG Tabs tablet Commonly known as: ELIQUIS Take 1 tablet (5 mg total) by mouth 2 (two) times daily.   ascorbic acid 500 MG tablet Commonly known as: VITAMIN C Take 1 tablet (500 mg total) by mouth daily.   ciprofloxacin 500 MG tablet Commonly known as: Cipro Take 1 tablet (500 mg total) by mouth 2 (two) times daily for 4 days.   furosemide 40 MG tablet Commonly known as: LASIX Take 1 tablet (40 mg total) by mouth daily as needed for fluid or edema. What changed:  when to take this reasons to take this   metoprolol tartrate 25 MG tablet Commonly known as: LOPRESSOR Take 0.5 tablets (12.5 mg total) by mouth 2 (two) times daily.   midodrine 10 MG tablet Commonly known as: PROAMATINE Take 1 tablet (10 mg total) by mouth 3 (three) times daily with meals.   nutrition supplement (JUVEN) Pack Take 1 packet by mouth 2 (two) times daily between meals.   potassium chloride SA 20 MEQ tablet Commonly known as: KLOR-CON M Take 20 mEq by mouth daily.   zinc sulfate (50mg  elemental zinc) 220 (50 Zn) MG capsule Take 1 capsule (220 mg total) by mouth daily.         Consultations: none  Procedures/Studies: none  DG Foot 2 Views Left Result Date: 03/18/2023 CLINICAL DATA:  Foot ulcer.  Assess for possible osteomyelitis. EXAM: LEFT FOOT - 2 VIEW COMPARISON:  09/28/2022. FINDINGS: Ulcer location not definitive on the radiographs. On the lateral view, there is an apparent fracture of the distal aspect of the proximal phalanx of what is likely the fifth toe. No other evidence of a fracture. No definite bone resorption. Joints are normally aligned. There is soft tissue swelling most prominent along the forefoot. No soft tissue air. Calcification is noted along the Achilles tendon and plantar fascia. IMPRESSION: 1. Apparent fracture of the distal aspect of the proximal phalanx of what is likely the fifth toe, which could be the result of osteomyelitis, although this is not well-defined on this exam. There is no other evidence to suggest osteomyelitis. 2. Soft tissue swelling. No soft tissue air. Electronically Signed   By: Amie Portland M.D.   On: 03/18/2023 11:27   DG Chest [facility]  1 View Result Date: 03/18/2023 CLINICAL DATA:  Possible sepsis. EXAM: PORTABLE CHEST 1 VIEW COMPARISON:  04/06/2011. FINDINGS: Cardiac silhouette is normal in size. No mediastinal or hilar masses. Mild chronic peripheral interstitial thickening. Lungs hyperexpanded but otherwise clear. No convincing pleural effusion.  No pneumothorax. Skeletal structures are grossly intact. IMPRESSION: 1. No no acute cardiopulmonary disease. Electronically Signed   By: Amie Portland M.D.   On: 03/18/2023 11:23     Subjective: - no chest pain, shortness of breath, no abdominal pain, nausea or vomiting.   Discharge Exam: BP 99/65 (BP Location: Right Arm)   Pulse 97   Temp 98.1 F (36.7 C) (Oral)   Resp 20   SpO2 97%   General: Pt is alert, awake, not in acute distress Cardiovascular: RRR, S1/S2 +, no rubs, no gallops Respiratory: CTA bilaterally, no wheezing, no rhonchi Abdominal: Soft, NT, ND,  bowel sounds + Extremities: no edema, no cyanosis    The results of significant diagnostics from this hospitalization (including imaging, microbiology, ancillary and laboratory) are listed below for reference.     Microbiology: Recent Results (from the past 240 hours)  Resp panel by RT-PCR (RSV, Flu A&B, Covid) Anterior Nasal Swab     Status: None   Collection Time: 03/18/23 10:16 AM   Specimen: Anterior Nasal Swab  Result Value Ref Range Status   SARS Coronavirus 2 by RT PCR NEGATIVE NEGATIVE Final   Influenza A by PCR NEGATIVE NEGATIVE Final   Influenza B by PCR NEGATIVE NEGATIVE Final    Comment: (NOTE) The Xpert Xpress SARS-CoV-2/FLU/RSV plus assay is intended as an aid in the diagnosis of influenza from Nasopharyngeal swab specimens and should not be used as a sole basis for treatment. Nasal washings and aspirates are unacceptable for Xpert Xpress SARS-CoV-2/FLU/RSV testing.  Fact Sheet for Patients: BloggerCourse.com  Fact Sheet for Healthcare Providers: SeriousBroker.it  This test is not yet approved or cleared by the [place] FDA and has been authorized for detection and/or diagnosis of SARS-CoV-2 by FDA under an Emergency Use Authorization (EUA). This EUA will remain in effect (meaning this test can be used) for the duration of the COVID-19 declaration under Section 564(b)(1) of the Act, 21 U.S.C. section 360bbb-3(b)(1), unless the authorization is terminated or revoked.     Resp Syncytial Virus by PCR NEGATIVE NEGATIVE Final    Comment: (NOTE) Fact Sheet for Patients: BloggerCourse.com  Fact Sheet for Healthcare Providers: SeriousBroker.it  This test is not yet approved or cleared by the [place] FDA and has been authorized for detection and/or diagnosis of SARS-CoV-2 by FDA under an Emergency Use Authorization (EUA). This EUA will remain in effect  (meaning this test can be used) for the duration of the COVID-19 declaration under Section 564(b)(1) of the Act, 21 U.S.C. section 360bbb-3(b)(1), unless the authorization is terminated or revoked.  Performed at [facility], [address] [place], [place] [address]   Blood Culture (routine x 2)     Status: Abnormal   Collection Time: 03/18/23 10:16 AM   Specimen: BLOOD  Result Value Ref Range Status   Specimen Description BLOOD LEFT ANTECUBITAL  Final   Special Requests   Final    BOTTLES DRAWN AEROBIC AND ANAEROBIC Blood Culture results may not be optimal due to an inadequate volume of blood received in culture bottles   Culture  Setup Time   Final    GRAM NEGATIVE RODS AEROBIC BOTTLE ONLY CRITICAL RESULT CALLED TO, READ BACK BY AND VERIFIED WITH:  V BRYK,PHARMD@0410  03/19/23 MK Performed at [facility], [address] [place], [place] [address]    Culture PROTEUS HAUSERI (A)  Final   Report Status 03/21/2023 FINAL  Final   Organism ID, Bacteria PROTEUS HAUSERI  Final   Organism ID, Bacteria PROTEUS HAUSERI  Final      Susceptibility   Proteus hauseri - KIRBY BAUER*    CEFAZOLIN RESISTANT Resistant    Proteus hauseri - MIC*    AMPICILLIN >=32 RESISTANT Resistant     CEFEPIME <=0.12 SENSITIVE Sensitive     CEFTAZIDIME <=1 SENSITIVE Sensitive     CEFTRIAXONE <=0.25 SENSITIVE Sensitive     CIPROFLOXACIN <=0.25 SENSITIVE Sensitive     GENTAMICIN <=1 SENSITIVE Sensitive     IMIPENEM 2 SENSITIVE Sensitive     TRIMETH/SULFA <=20 SENSITIVE Sensitive     AMPICILLIN/SULBACTAM 16 INTERMEDIATE Intermediate     PIP/TAZO <=4 SENSITIVE Sensitive ug/mL    * PROTEUS HAUSERI    PROTEUS HAUSERI  Blood Culture ID Panel (Reflexed)     Status: Abnormal   Collection Time: 03/18/23 10:16 AM  Result Value Ref Range Status   Enterococcus faecalis NOT DETECTED NOT DETECTED Final   Enterococcus Faecium NOT DETECTED NOT DETECTED Final   Listeria monocytogenes NOT DETECTED NOT  DETECTED Final   Staphylococcus species NOT DETECTED NOT DETECTED Final   Staphylococcus aureus (BCID) NOT DETECTED NOT DETECTED Final   Staphylococcus epidermidis NOT DETECTED NOT DETECTED Final   Staphylococcus lugdunensis NOT DETECTED NOT DETECTED Final   Streptococcus species NOT DETECTED NOT DETECTED Final   Streptococcus agalactiae NOT DETECTED NOT DETECTED Final   Streptococcus pneumoniae NOT DETECTED NOT DETECTED Final   Streptococcus pyogenes NOT DETECTED NOT DETECTED Final   A.calcoaceticus-baumannii NOT DETECTED NOT DETECTED Final   Bacteroides fragilis NOT DETECTED NOT DETECTED Final   Enterobacterales DETECTED (A) NOT DETECTED Final    Comment: Enterobacterales represent a large order of gram negative bacteria, not a single organism. CRITICAL RESULT CALLED TO, READ BACK BY AND VERIFIED WITH: V BRYK,PHARMD@0410  03/19/23 MK    Enterobacter cloacae complex NOT DETECTED NOT DETECTED Final   Escherichia coli NOT DETECTED NOT DETECTED Final   Klebsiella aerogenes NOT DETECTED NOT DETECTED Final   Klebsiella oxytoca NOT DETECTED NOT DETECTED Final   Klebsiella pneumoniae NOT DETECTED NOT DETECTED Final   Proteus species DETECTED (A) NOT DETECTED Final    Comment: CRITICAL RESULT CALLED TO, READ BACK BY AND VERIFIED WITH: V BRYK,PHARMD@0410  03/19/23 MK    Salmonella species NOT DETECTED NOT DETECTED Final   Serratia marcescens NOT DETECTED NOT DETECTED Final   Haemophilus influenzae NOT DETECTED NOT DETECTED Final   Neisseria meningitidis NOT DETECTED NOT DETECTED Final   Pseudomonas aeruginosa NOT DETECTED NOT DETECTED Final   Stenotrophomonas maltophilia NOT DETECTED NOT DETECTED Final   Candida albicans NOT DETECTED NOT DETECTED Final   Candida auris NOT DETECTED NOT DETECTED Final   Candida glabrata NOT DETECTED NOT DETECTED Final   Candida krusei NOT DETECTED NOT DETECTED Final   Candida parapsilosis NOT DETECTED NOT DETECTED Final   Candida tropicalis NOT DETECTED NOT  DETECTED Final   Cryptococcus neoformans/gattii NOT DETECTED NOT DETECTED Final   CTX-M ESBL NOT DETECTED NOT DETECTED Final   Carbapenem resistance IMP NOT DETECTED NOT DETECTED Final   Carbapenem resistance KPC NOT DETECTED NOT DETECTED Final   Carbapenem resistance NDM NOT DETECTED NOT DETECTED Final   Carbapenem resist OXA 48 LIKE NOT DETECTED NOT DETECTED Final   Carbapenem resistance VIM  NOT DETECTED NOT DETECTED Final    Comment: Performed at [facility], [address] [place], [place] [address]  Blood Culture (routine x 2)     Status: None   Collection Time: 03/18/23 10:21 AM   Specimen: BLOOD RIGHT HAND  Result Value Ref Range Status   Specimen Description BLOOD RIGHT HAND  Final   Special Requests   Final    AEROBIC BOTTLE ONLY Blood Culture results may not be optimal due to an inadequate volume of blood received in culture bottles   Culture   Final    NO GROWTH 5 DAYS Performed at [facility], [address], [place], [place] [address]    Report Status 03/23/2023 FINAL  Final  Urine Culture     Status: None   Collection Time: 03/18/23 11:00 PM   Specimen: Urine, Random  Result Value Ref Range Status   Specimen Description URINE, RANDOM  Final   Special Requests NONE Reflexed from [id number]  Final   Culture   Final    NO GROWTH Performed at [facility], [address], [place], [place] [address]    Report Status 03/19/2023 FINAL  Final     Labs: Basic Metabolic Panel: Recent Labs  Lab 03/19/23 0344 03/20/23 0749 03/21/23 0307 03/22/23 0408 03/23/23 0329  NA 137 140 137 134* 134*  K 3.1* 4.4 4.2 4.3 4.1  CL 106 106 104 105 102  CO2 23 25 27 25 27   GLUCOSE 131* 104* 113* 110* 123*  BUN 30* 29* 27* 21 16  CREATININE 1.11* 0.75 0.74 0.77 0.68  CALCIUM 7.5* 7.7* 7.9* 7.7* 7.6*  MG  --   --   --   --  1.9  PHOS  --   --   --   --  2.7   Liver Function Tests: Recent Labs  Lab 03/18/23 1030 03/19/23 0344 03/23/23 0329  AST 69* 38 56*   ALT 38 27 41  ALKPHOS 49 36* 35*  BILITOT 1.2 0.7 0.4  PROT 5.8* 4.2* 4.9*  ALBUMIN 2.4* 1.7* 1.8*   CBC: Recent Labs  Lab 03/18/23 1030 03/19/23 0344 03/23/23 0329  WBC 12.1* 9.7 12.8*  NEUTROABS 10.1*  --   --   HGB 14.8 10.8* 10.9*  HCT 43.6 32.5* 33.3*  MCV 97.3 97.3 98.8  PLT 212 185 205   CBG: No results for input(s): "GLUCAP" in the last 168 hours. Hgb A1c No results for input(s): "HGBA1C" in the last 72 hours. Lipid Profile No results for input(s): "CHOL", "HDL", "LDLCALC", "TRIG", "CHOLHDL", "LDLDIRECT" in the last 72 hours. Thyroid function studies No results for input(s): "TSH", "T4TOTAL", "T3FREE", "THYROIDAB" in the last 72 hours.  Invalid input(s): "FREET3" Urinalysis    Component Value Date/Time   COLORURINE AMBER (A) 03/18/2023 2300   APPEARANCEUR HAZY (A) 03/18/2023 2300   LABSPEC 1.023 03/18/2023 2300   PHURINE 5.0 03/18/2023 2300   GLUCOSEU NEGATIVE 03/18/2023 2300   HGBUR SMALL (A) 03/18/2023 2300   HGBUR trace-lysed 07/12/2009 0948   BILIRUBINUR NEGATIVE 03/18/2023 2300   KETONESUR 5 (A) 03/18/2023 2300   PROTEINUR 30 (A) 03/18/2023 2300   UROBILINOGEN 0.2 04/06/2011 1453   NITRITE NEGATIVE 03/18/2023 2300   LEUKOCYTESUR TRACE (A) 03/18/2023 2300    FURTHER DISCHARGE INSTRUCTIONS:   Get Medicines reviewed and adjusted: Please take all your medications with you for your next visit with your Primary MD   Laboratory/radiological data: Please request your Primary MD to go over  all hospital tests and procedure/radiological results at the follow up, please ask your Primary MD to get all Hospital records sent to his/her office.   In some cases, they will be blood work, cultures and biopsy results pending at the time of your discharge. Please request that your primary care M.D. goes through all the records of your hospital data and follows up on these results.   Also Note the following: If you experience worsening of your admission symptoms,  develop shortness of breath, life threatening emergency, suicidal or homicidal thoughts you must seek medical attention immediately by calling 911 or calling your MD immediately  if symptoms less severe.   You must read complete instructions/literature along with all the possible adverse reactions/side effects for all the Medicines you take and that have been prescribed to you. Take any new Medicines after you have completely understood and accpet all the possible adverse reactions/side effects.    Do not drive when taking Pain medications or sleeping medications (Benzodaizepines)   Do not take more than prescribed Pain, Sleep and Anxiety Medications. It is not advisable to combine anxiety,sleep and pain medications without talking with your primary care practitioner   Special Instructions: If you have smoked or chewed Tobacco  in the last 2 yrs please stop smoking, stop any regular Alcohol  and or any Recreational drug use.   Wear Seat belts while driving.   Please note: You were cared for by a hospitalist during your hospital stay. Once you are discharged, your primary care physician will handle any further medical issues. Please note that NO REFILLS for any discharge medications will be authorized once you are discharged, as it is imperative that you return to your primary care physician (or establish a relationship with a primary care physician if you do not have one) for your post hospital discharge needs so that they can reassess your need for medications and monitor your lab values.  Time coordinating discharge: 35 minutes  SIGNED:  Pamella Pert, MD, PhD 03/24/2023, 9:26 AM

## 2023-03-24 NOTE — Plan of Care (Signed)
  Problem: Education: Goal: Knowledge of General Education information will improve Description: Including pain rating scale, medication(s)/side effects and non-pharmacologic comfort measures Outcome: Progressing   Problem: Clinical Measurements: Goal: Ability to maintain clinical measurements within normal limits will improve Outcome: Progressing Goal: Diagnostic test results will improve Outcome: Progressing Goal: Respiratory complications will improve Outcome: Progressing Goal: Cardiovascular complication will be avoided Outcome: Progressing   Problem: Nutrition: Goal: Adequate nutrition will be maintained Outcome: Progressing   Problem: Coping: Goal: Level of anxiety will decrease Outcome: Progressing   Problem: Elimination: Goal: Will not experience complications related to bowel motility Outcome: Progressing Goal: Will not experience complications related to urinary retention Outcome: Progressing   Problem: Pain Managment: Goal: General experience of comfort will improve and/or be controlled Outcome: Progressing   Problem: Safety: Goal: Ability to remain free from injury will improve Outcome: Progressing   Problem: Skin Integrity: Goal: Risk for impaired skin integrity will decrease Outcome: Progressing   Problem: Clinical Measurements: Goal: Will remain free from infection Outcome: Not Progressing   Problem: Activity: Goal: Risk for activity intolerance will decrease Outcome: Not Progressing

## 2023-03-24 NOTE — Progress Notes (Signed)
 Report called to [place], LPN at [facility].

## 2023-05-21 ENCOUNTER — Other Ambulatory Visit: Payer: Self-pay | Admitting: Internal Medicine

## 2023-05-21 DIAGNOSIS — L89150 Pressure ulcer of sacral region, unstageable: Secondary | ICD-10-CM

## 2023-06-06 ENCOUNTER — Ambulatory Visit (HOSPITAL_COMMUNITY)
Admission: RE | Admit: 2023-06-06 | Discharge: 2023-06-06 | Disposition: A | Discharge status: Home / Self Care | Source: Ambulatory Visit | Attending: Internal Medicine | Admitting: Internal Medicine

## 2023-06-06 DIAGNOSIS — L89150 Pressure ulcer of sacral region, unstageable: Secondary | ICD-10-CM | POA: Diagnosis present

## 2023-06-06 MED ORDER — GADOBUTROL 1 MMOL/ML IV SOLN
8.0000 mL | Freq: Once | INTRAVENOUS | Status: AC | PRN
  Administered 2023-06-06: 8 mL via INTRAVENOUS

## 2023-07-19 ENCOUNTER — Other Ambulatory Visit: Payer: Self-pay

## 2023-07-19 ENCOUNTER — Encounter: Payer: Self-pay | Admitting: Infectious Diseases

## 2023-07-19 ENCOUNTER — Ambulatory Visit: Admitting: Infectious Diseases

## 2023-07-19 VITALS — BP 116/75 | HR 85 | Temp 97.6°F

## 2023-07-19 DIAGNOSIS — M4628 Osteomyelitis of vertebra, sacral and sacrococcygeal region: Secondary | ICD-10-CM

## 2023-07-19 DIAGNOSIS — L89159 Pressure ulcer of sacral region, unspecified stage: Secondary | ICD-10-CM | POA: Diagnosis not present

## 2023-07-19 DIAGNOSIS — I872 Venous insufficiency (chronic) (peripheral): Secondary | ICD-10-CM

## 2023-07-19 DIAGNOSIS — L89610 Pressure ulcer of right heel, unstageable: Secondary | ICD-10-CM

## 2023-07-19 NOTE — Progress Notes (Signed)
 Patient Active Problem List   Diagnosis Date Noted   Cellulitis of left lower extremity 03/18/2023   AKI (acute kidney injury) (HCC) 03/18/2023   Cellulitis of right lower extremity 03/18/2023   Moderate aortic stenosis - mean gradient 22.5  mHg. peak gradient 37.3  mmHg 10/01/2022   Bilateral cellulitis of lower leg 09/28/2022   A-fib (HCC) 09/28/2022   Pain due to onychomycosis of toenails of both feet 05/27/2019   Venous (peripheral) insufficiency 05/27/2019   Lymphedema 05/27/2019   Hypokalemia 04/20/2011   OA (osteoarthritis) of knee 04/17/2011   TIA 11/02/2009   HYPERGLYCEMIA, BORDERLINE 07/20/2009   EYE FLOATERS, LEFT 07/02/2009   FATIGUE 07/02/2009   WART, VIRAL 11/20/2008   Vitreous degeneration 11/20/2008   HEMORRHOIDS, INTERNAL 06/25/2008   EXTERNAL HEMORRHOIDS 06/25/2008   KERATOSIS PILARIS 06/25/2008   HYPERTENSION, BORDERLINE 02/26/2007   SHOULDER PAIN, RIGHT 02/26/2007   OBESITY, MORBID 05/05/2006   INSUFFICIENCY, VENOUS NOS 05/05/2006   OSTEOARTHRITIS 05/05/2006   DIVERTICULOSIS, COLON 12/08/2003    Patient's Medications  New Prescriptions   No medications on file  Previous Medications   APIXABAN  (ELIQUIS ) 5 MG TABS TABLET    Take 1 tablet (5 mg total) by mouth 2 (two) times daily.   ASCORBIC ACID  (VITAMIN C ) 500 MG TABLET    Take 1 tablet (500 mg total) by mouth daily.   FUROSEMIDE  (LASIX ) 40 MG TABLET    Take 1 tablet (40 mg total) by mouth daily as needed for fluid or edema.   METOPROLOL  TARTRATE (LOPRESSOR ) 25 MG TABLET    Take 0.5 tablets (12.5 mg total) by mouth 2 (two) times daily.   MIDODRINE  (PROAMATINE ) 10 MG TABLET    Take 1 tablet (10 mg total) by mouth 3 (three) times daily with meals.   NUTRITION SUPPLEMENT, JUVEN, (JUVEN) PACK    Take 1 packet by mouth 2 (two) times daily between meals.   POTASSIUM CHLORIDE  SA (KLOR-CON  M) 20 MEQ TABLET    Take 20 mEq by mouth daily.   ZINC  SULFATE 220 (50 ZN) MG CAPSULE    Take 1 capsule (220 mg total)  by mouth daily.  Modified Medications   No medications on file  Discontinued Medications   No medications on file    Subjective: 88 Y O female with prior h/o chronic lymphedema of lower extremities, HTN, A fib, arthritis, CVA, OSA who is referred for evaluation and management of chronic sacral wound and rt heel ulcer  Patient is accompanied by transport staff, no staff from facility and no family member. I reviewed referral notes from [facility]. She appears to be on doxycycline since 7/10 and ciprofloxacin  since 7/3 with planned course through 7/31. She appears to have completed 6 weeks course of IV meropenem prior to being started on above PO antibiotics. She had a fu MRI at the end of IV antibiotics on 6/26 which still showed osteomyelitis. She appears to be followed by wound care at the facility.   She was discharged from [facility] in march when admitted for sepsis 2/2 proteus bacteremia and LLE cellulitis.   She is a poor historian and seems to have some dementia. She cannot tell me when did she develop wounds in her sacrum, rt foot, became bedridden. She however denies having fevers, chills. Denies nausea, vomiting, diarrhea. Appetite is good. She says she is at the doctor's office but unable to tell me the year. She says she has a sister and reports her son died.  Review of Systems: all systems reviewed with pertinent positives and negatives as listed above   Past Medical History:  Diagnosis Date   Arthritis    PAIN AND OA BOTH KNEES   Hypertension    Shortness of breath    PT RELATES TO HER WEIGHT-OUT OF SHAPE   Sleep apnea    STOP BANG SCORE 5   Stroke (HCC)    POSS TIA IN 2012    Past Surgical History:  Procedure Laterality Date   EYE SURGERY     BILATERAL CATARACT EXTRACTION   LEFT KNEE ARTHROSCOPY     TOTAL KNEE ARTHROPLASTY  04/17/2011   Procedure: TOTAL KNEE ARTHROPLASTY;  Surgeon: Dempsey LULLA Moan, MD;  Location: [facility];  Service: Orthopedics;  Laterality:  Right;  ' Social History   Tobacco Use   Smoking status: Former    Current packs/day: 0.75    Average packs/day: 0.8 packs/day for 15.0 years (11.3 ttl pk-yrs)    Types: Cigarettes   Smokeless tobacco: Never   Tobacco comments:    QUIT SMOKING ABOUT 2005 ?  Substance Use Topics   Alcohol  use: Yes    Comment: RARELY   Drug use: No    Family History  Problem Relation Age of Onset   Heart attack Mother     Allergies  Allergen Reactions   Codeine Nausea And Vomiting and Other (See Comments)    Caused GI upset    Health Maintenance  Topic Date Due   Zoster Vaccines- Shingrix (1 of 2) Never done   Pneumococcal Vaccine: 50+ Years (2 of 2 - PCV) 08/20/2003   COVID-19 Vaccine (1 - 2024-25 season) Never done   INFLUENZA VACCINE  08/03/2023   Medicare Annual Wellness (AWV)  10/10/2023   DTaP/Tdap/Td (3 - Td or Tdap) 11/19/2030   DEXA SCAN  Completed   Hepatitis B Vaccines  Aged Out   HPV VACCINES  Aged Out   Meningococcal B Vaccine  Aged Out    Objective: BP 116/75   Pulse 85   Temp 97.6 F (36.4 C) (Oral)   SpO2 95%    Physical Exam Constitutional:      Appearance: Normal appearance.  HENT:     Head: Normocephalic and atraumatic.      Mouth: Mucous membranes are moist.  Eyes:    Conjunctiva/sclera: Conjunctivae normal.     Pupils: Pupils are equal, round, and b/l symmetrical    Cardiovascular:     Rate and Rhythm: Normal rate and Irregular rhythm.     Heart sounds: s1s2  Pulmonary:     Effort: Pulmonary effort is normal.     Breath sounds: Normal breath sounds.   Abdominal:     General: Non distended     Palpations: soft. Non tender  Musculoskeletal:        General: bedridden  Skin:    General: Skin is warm and dry.     Comments: Chronic sacral stage 4 ulcer with fibrinous exudate, no purlence or surrounding cellulitis   Rt heel ulcer, no purulence, fluctuance or surrounding cellulitis    Changes s/o venous insufficiency in lower  extremities Neurological:     General:     Mental Status: awake, alert, possible dementia  Psychiatric:        Mood and Affect: Mood normal.   Lab Results Lab Results  Component Value Date   WBC 12.8 (H) 03/23/2023   HGB 10.9 (L) 03/23/2023   HCT 33.3 (L) 03/23/2023   MCV 98.8 03/23/2023  PLT 205 03/23/2023    Lab Results  Component Value Date   CREATININE 0.68 03/23/2023   BUN 16 03/23/2023   NA 134 (L) 03/23/2023   K 4.1 03/23/2023   CL 102 03/23/2023   CO2 27 03/23/2023    Lab Results  Component Value Date   ALT 41 03/23/2023   AST 56 (H) 03/23/2023   ALKPHOS 35 (L) 03/23/2023   BILITOT 0.4 03/23/2023    Lab Results  Component Value Date   CHOL  09/25/2009    172        ATP III CLASSIFICATION:  <200     mg/dL   Desirable  799-760  mg/dL   Borderline High  >=759    mg/dL   High          HDL 39 (L) 09/25/2009   LDLCALC (H) 09/25/2009    104        Total Cholesterol/HDL:CHD Risk Coronary Heart Disease Risk Table                     Men   Women  1/2 Average Risk   3.4   3.3  Average Risk       5.0   4.4  2 X Average Risk   9.6   7.1  3 X Average Risk  23.4   11.0        Use the calculated Patient Ratio above and the CHD Risk Table to determine the patient's CHD Risk.        ATP III CLASSIFICATION (LDL):  <100     mg/dL   Optimal  899-870  mg/dL   Near or Above                    Optimal  130-159  mg/dL   Borderline  839-810  mg/dL   High  >809     mg/dL   Very High   TRIG 853 09/25/2009   CHOLHDL 4.4 09/25/2009   No results found for: LABRPR, RPRTITER No results found for: HIV1RNAQUANT, HIV1RNAVL, CD4TABS        06/06/23 MRI sacrum  IMPRESSION: Focal chronic appearing decubitus ulcer overlying the central aspect of the mid to lower coccyx.   There is mild to moderate marrow edema and enhancement to a couple of mid to lower coccygeal segments consistent with mild osteomyelitis. No obvious erosion/cortical destruction. Recommend  close clinical follow-up and reimaging if warranted.   Likely reactive presacral edema/stranding.   Diffuse subcutaneous edema. Correlate for lymphedema versus cellulitis.   Nonspecific distention of the urinary bladder. Recommend clinical correlation.   Assessment/Plan # Chronic stage 4 sacral ulcer/ostoemyelitis # Rt heel ulcer  - recent completion of 6 weeks of meropenem prior to switch to PO doxycycline and ciprofloxacin  planned through 7/31 - Superficial wound PCR done from RT heel and sacrum of unclear clinical significance in an open wound and will not target antibiotics  Plan - she is bedridden with risk of poor chances of wound healing  - Unlikely further IV or PO antibiotics is likely to be beneficial, can complete course of above PO antibiotics as planned though 7/31 and then stop - continue wound care, offloading and optimize nutrition - Amb referral to plastics surgery and Ortho for completeness  - CBC and BMP, to be done at facility and fax labs to [facility] - fu as needed  I spent 63 minutes involved in face-to-face and non-face-to-face activities for this patient on the day  of the visit. Professional time spent includes the following activities: Preparing to see the patient (review of tests), Obtaining and reviewing separately obtained history (outside referral notes from [facility] center), Performing a medically appropriate examination and evaluation, referring to other health care professionals, Documenting clinical information in the EMR, Independently interpreting results (not separately reported), Communicating results to the patient/caregiver, Counseling and educating the patient/caregiver and Care coordination (not separately reported).   Of note, portions of this note may have been created with voice recognition software. While this note has been edited for accuracy, occasional wrong-word or 'sound-a-like' substitutions may have occurred due to the inherent  limitations of voice recognition software.   Annalee Joseph, MD [facility] 07/19/2023, 8:11 AM

## 2023-09-03 DEATH — deceased
